# Patient Record
Sex: Female | Born: 1985
Health system: Southern US, Community
[De-identification: ages and names within clinical notes are randomized; demographics above are authoritative.]

## PROBLEM LIST (undated history)

## (undated) ENCOUNTER — Inpatient Hospital Stay (HOSPITAL_COMMUNITY): Payer: Self-pay

## (undated) DIAGNOSIS — G43909 Migraine, unspecified, not intractable, without status migrainosus: Secondary | ICD-10-CM

## (undated) DIAGNOSIS — I10 Essential (primary) hypertension: Secondary | ICD-10-CM

## (undated) DIAGNOSIS — O26899 Other specified pregnancy related conditions, unspecified trimester: Secondary | ICD-10-CM

## (undated) DIAGNOSIS — Z6791 Unspecified blood type, Rh negative: Secondary | ICD-10-CM

## (undated) DIAGNOSIS — R87619 Unspecified abnormal cytological findings in specimens from cervix uteri: Secondary | ICD-10-CM

## (undated) DIAGNOSIS — IMO0002 Reserved for concepts with insufficient information to code with codable children: Secondary | ICD-10-CM

## (undated) HISTORY — DX: Other specified pregnancy related conditions, unspecified trimester: O26.899

## (undated) HISTORY — DX: Essential (primary) hypertension: I10

## (undated) HISTORY — PX: DILATION AND CURETTAGE OF UTERUS: SHX78

## (undated) HISTORY — DX: Reserved for concepts with insufficient information to code with codable children: IMO0002

## (undated) HISTORY — PX: TONSILLECTOMY: SUR1361

## (undated) HISTORY — DX: Unspecified abnormal cytological findings in specimens from cervix uteri: R87.619

## (undated) HISTORY — DX: Unspecified blood type, rh negative: Z67.91

## (undated) HISTORY — DX: Migraine, unspecified, not intractable, without status migrainosus: G43.909

## (undated) HISTORY — PX: TUBAL LIGATION: SHX77

---
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
## 2010-01-10 ENCOUNTER — Ambulatory Visit: Payer: Self-pay | Admitting: Obstetrics and Gynecology

## 2010-01-20 ENCOUNTER — Ambulatory Visit: Payer: Self-pay | Admitting: Obstetrics & Gynecology

## 2010-02-17 ENCOUNTER — Ambulatory Visit: Payer: Self-pay | Admitting: Obstetrics and Gynecology

## 2010-03-01 ENCOUNTER — Ambulatory Visit (HOSPITAL_COMMUNITY)
Admission: RE | Admit: 2010-03-01 | Discharge: 2010-03-01 | Payer: Self-pay | Source: Home / Self Care | Attending: Family Medicine | Admitting: Family Medicine

## 2010-03-17 ENCOUNTER — Ambulatory Visit: Payer: Self-pay | Admitting: Obstetrics & Gynecology

## 2010-04-19 ENCOUNTER — Ambulatory Visit
Admission: RE | Admit: 2010-04-19 | Discharge: 2010-04-19 | Payer: Self-pay | Source: Home / Self Care | Attending: Obstetrics & Gynecology | Admitting: Obstetrics & Gynecology

## 2010-04-19 ENCOUNTER — Encounter: Payer: Self-pay | Admitting: Obstetrics & Gynecology

## 2010-04-20 ENCOUNTER — Inpatient Hospital Stay (HOSPITAL_COMMUNITY)
Admission: AD | Admit: 2010-04-20 | Discharge: 2010-04-20 | Disposition: A | Discharge status: Home / Self Care | Payer: BC Managed Care – PPO | Source: Ambulatory Visit | Attending: Obstetrics & Gynecology | Admitting: Obstetrics & Gynecology

## 2010-04-20 DIAGNOSIS — N390 Urinary tract infection, site not specified: Secondary | ICD-10-CM | POA: Insufficient documentation

## 2010-04-20 DIAGNOSIS — M549 Dorsalgia, unspecified: Secondary | ICD-10-CM

## 2010-04-20 DIAGNOSIS — O2390 Unspecified genitourinary tract infection in pregnancy, unspecified trimester: Secondary | ICD-10-CM | POA: Insufficient documentation

## 2010-04-20 LAB — URINE MICROSCOPIC-ADD ON

## 2010-04-20 LAB — URINALYSIS, ROUTINE W REFLEX MICROSCOPIC: Urobilinogen, UA: 1 mg/dL (ref 0.0–1.0)

## 2010-04-22 LAB — URINE CULTURE: Culture  Setup Time: 201202020412

## 2010-05-11 ENCOUNTER — Encounter: Payer: BC Managed Care – PPO | Admitting: Obstetrics & Gynecology

## 2010-05-11 ENCOUNTER — Encounter: Payer: Self-pay | Admitting: Family Medicine

## 2010-05-11 ENCOUNTER — Encounter: Payer: Self-pay | Admitting: Obstetrics & Gynecology

## 2010-05-11 DIAGNOSIS — Z3400 Encounter for supervision of normal first pregnancy, unspecified trimester: Secondary | ICD-10-CM

## 2010-05-11 DIAGNOSIS — O361190 Maternal care for Anti-A sensitization, unspecified trimester, not applicable or unspecified: Secondary | ICD-10-CM

## 2010-05-11 LAB — CONVERTED CEMR LAB
MCV: 93 fL (ref 78.0–100.0)
RDW: 14.1 % (ref 11.5–15.5)
WBC: 8.6 10*3/uL (ref 4.0–10.5)

## 2010-05-15 ENCOUNTER — Inpatient Hospital Stay (HOSPITAL_COMMUNITY)
Admission: AD | Admit: 2010-05-15 | Discharge: 2010-05-15 | Disposition: A | Discharge status: Home / Self Care | Payer: BC Managed Care – PPO | Source: Ambulatory Visit | Attending: Obstetrics and Gynecology | Admitting: Obstetrics and Gynecology

## 2010-05-15 ENCOUNTER — Inpatient Hospital Stay (HOSPITAL_COMMUNITY): Payer: BC Managed Care – PPO

## 2010-05-15 DIAGNOSIS — N390 Urinary tract infection, site not specified: Secondary | ICD-10-CM

## 2010-05-15 DIAGNOSIS — O2390 Unspecified genitourinary tract infection in pregnancy, unspecified trimester: Secondary | ICD-10-CM

## 2010-05-15 DIAGNOSIS — R109 Unspecified abdominal pain: Secondary | ICD-10-CM | POA: Insufficient documentation

## 2010-05-15 DIAGNOSIS — M549 Dorsalgia, unspecified: Secondary | ICD-10-CM

## 2010-05-15 DIAGNOSIS — O99891 Other specified diseases and conditions complicating pregnancy: Secondary | ICD-10-CM | POA: Insufficient documentation

## 2010-05-15 LAB — URINALYSIS, ROUTINE W REFLEX MICROSCOPIC
Ketones, ur: NEGATIVE mg/dL
Protein, ur: NEGATIVE mg/dL
Urine Glucose, Fasting: NEGATIVE mg/dL
Urobilinogen, UA: 0.2 mg/dL (ref 0.0–1.0)

## 2010-05-15 LAB — URINE MICROSCOPIC-ADD ON

## 2010-05-25 ENCOUNTER — Encounter: Payer: BC Managed Care – PPO | Admitting: Obstetrics & Gynecology

## 2010-05-25 DIAGNOSIS — Z3400 Encounter for supervision of normal first pregnancy, unspecified trimester: Secondary | ICD-10-CM

## 2010-06-08 ENCOUNTER — Encounter: Payer: BC Managed Care – PPO | Admitting: Obstetrics & Gynecology

## 2010-06-08 DIAGNOSIS — Z3480 Encounter for supervision of other normal pregnancy, unspecified trimester: Secondary | ICD-10-CM

## 2010-06-29 ENCOUNTER — Encounter: Payer: BC Managed Care – PPO | Admitting: Obstetrics & Gynecology

## 2010-06-29 DIAGNOSIS — Z3480 Encounter for supervision of other normal pregnancy, unspecified trimester: Secondary | ICD-10-CM

## 2010-07-02 ENCOUNTER — Inpatient Hospital Stay (HOSPITAL_COMMUNITY)
Admission: AD | Admit: 2010-07-02 | Discharge: 2010-07-02 | Disposition: A | Discharge status: Home / Self Care | Payer: BC Managed Care – PPO | Source: Ambulatory Visit | Attending: Obstetrics and Gynecology | Admitting: Obstetrics and Gynecology

## 2010-07-02 DIAGNOSIS — O99891 Other specified diseases and conditions complicating pregnancy: Secondary | ICD-10-CM | POA: Insufficient documentation

## 2010-07-02 DIAGNOSIS — R51 Headache: Secondary | ICD-10-CM | POA: Insufficient documentation

## 2010-07-05 ENCOUNTER — Encounter: Payer: BC Managed Care – PPO | Admitting: Obstetrics and Gynecology

## 2010-07-05 DIAGNOSIS — Z3480 Encounter for supervision of other normal pregnancy, unspecified trimester: Secondary | ICD-10-CM

## 2010-07-14 ENCOUNTER — Encounter (INDEPENDENT_AMBULATORY_CARE_PROVIDER_SITE_OTHER): Payer: BC Managed Care – PPO | Admitting: Obstetrics & Gynecology

## 2010-07-14 DIAGNOSIS — Z3480 Encounter for supervision of other normal pregnancy, unspecified trimester: Secondary | ICD-10-CM

## 2010-07-20 ENCOUNTER — Encounter (INDEPENDENT_AMBULATORY_CARE_PROVIDER_SITE_OTHER): Payer: BC Managed Care – PPO | Admitting: Obstetrics & Gynecology

## 2010-07-20 DIAGNOSIS — Z3400 Encounter for supervision of normal first pregnancy, unspecified trimester: Secondary | ICD-10-CM

## 2010-07-27 ENCOUNTER — Encounter (INDEPENDENT_AMBULATORY_CARE_PROVIDER_SITE_OTHER): Payer: BC Managed Care – PPO | Admitting: Family Medicine

## 2010-07-27 ENCOUNTER — Inpatient Hospital Stay (HOSPITAL_COMMUNITY)
Admission: AD | Admit: 2010-07-27 | Discharge: 2010-07-27 | Disposition: A | Discharge status: Home / Self Care | Payer: BC Managed Care – PPO | Source: Ambulatory Visit | Attending: Family Medicine | Admitting: Family Medicine

## 2010-07-27 DIAGNOSIS — O479 False labor, unspecified: Secondary | ICD-10-CM | POA: Insufficient documentation

## 2010-07-27 DIAGNOSIS — Z3400 Encounter for supervision of normal first pregnancy, unspecified trimester: Secondary | ICD-10-CM

## 2010-07-28 ENCOUNTER — Inpatient Hospital Stay (HOSPITAL_COMMUNITY)
Admission: AD | Admit: 2010-07-28 | Discharge: 2010-07-30 | DRG: 372 | Disposition: A | Discharge status: Home / Self Care | Payer: BC Managed Care – PPO | Source: Ambulatory Visit | Attending: Obstetrics and Gynecology | Admitting: Obstetrics and Gynecology

## 2010-07-28 LAB — CBC
MCH: 30.9 pg (ref 26.0–34.0)
MCHC: 34.3 g/dL (ref 30.0–36.0)
Platelets: 203 10*3/uL (ref 150–400)
RBC: 3.91 MIL/uL (ref 3.87–5.11)
WBC: 15 10*3/uL — ABNORMAL HIGH (ref 4.0–10.5)

## 2010-07-29 LAB — CBC
HCT: 25.9 % — ABNORMAL LOW (ref 36.0–46.0)
MCV: 91.2 fL (ref 78.0–100.0)
Platelets: 171 10*3/uL (ref 150–400)
RBC: 2.84 MIL/uL — ABNORMAL LOW (ref 3.87–5.11)
RDW: 14.2 % (ref 11.5–15.5)
WBC: 16.5 10*3/uL — ABNORMAL HIGH (ref 4.0–10.5)

## 2010-07-29 LAB — RPR: RPR Ser Ql: NONREACTIVE

## 2010-08-02 ENCOUNTER — Encounter: Payer: BC Managed Care – PPO | Admitting: Obstetrics & Gynecology

## 2010-08-08 NOTE — Discharge Summary (Signed)
NAMECECILY, [name]              ACCOUNT NO.:  [id number]  MEDICAL RECORD NO.:  [id number]           PATIENT TYPE:  I  LOCATION:  9142                          [facility]ILITY:  [facility]  PHYSICIAN:  Catalina Antigua, MD     DATE OF BIRTH:  04-13-1985  DATE OF ADMISSION:  07/28/2010 DATE OF DISCHARGE:  07/30/2010                              DISCHARGE SUMMARY   DISCHARGE DIAGNOSES: 1. Spontaneous vaginal delivery. 2. Retained placental products with borderline temperature.  LABS AND STUDIES: 1. CBC obtained on Jul 28, 2010, demonstrated white count of 15,     hemoglobin 12.1, platelets of 203. 2. RPR obtained on Jul 28, 2010, is nonreactive. 3. CBC obtained on Jul 29, 2010, demonstrated white count of 16.5,     hemoglobin 8.8, platelets 174.  BRIEF HOSPITAL COURSE:  This is a 25 year old female who was admitted for spontaneous onset of labor at 68 weeks' gestation.  On Jul 28, 2010, at 1727, she delivered a viable female infant with Apgars of 8 and 9. Weight was 6 pounds 14 ounces.  The patient was found to have retained placental tissue following delivery with some continued bleeding.  The tissue was manually evacuated and the bleeding subsided.  At that time, the patient was also noted to have a borderline temperature of 99.8 degrees Fahrenheit.  Accordingly, the patient was placed on gentamicin plus clindamycin for 24 hours.  The patient did well following delivery, pain was controlled with oral medications, and the patient's diet rapidly advanced.  On postpartum day #2, the patient was felt stable for discharge.  At the time of discharge, the patient's vital signs were stable, she had remained afebrile throughout the remainder of her hospital course, and she was only experiencing moderate amounts of lochia.  DISCHARGE INSTRUCTIONS:  The patient was discharged home with a regular diet, no activity restrictions, pelvic rest for 6 weeks, and no specific wound care  instructions.  DISCHARGE MEDICATIONS: 1. Zofran 4 mg by mouth every 6 hours as needed for nausea (the     patient did not require this medication while in hospital, but did     require it prior to hospitalization). 2. Tylenol PM daily at bedtime as needed for sleep. 3. Tylenol 500 mg by mouth every 6 hours as needed for pain, not to be     taken at the same time as Tylenol PM. 4. Prenatal vitamin, take for 6 weeks while breastfeeding. 5. Colace 100 mg by mouth twice daily as needed for constipation. 6. Motrin 600 mg by mouth every 6 hours as needed for pain.  FOLLOWUP APPOINTMENTS:  The patient is to return to [facility] in 6 weeks for her postpartum check.  At this point in time, the patient is interested in having a Mirena placed.  DISCHARGE CONDITION:  The patient was discharged home in stable medical condition.    ______________________________ Linda Homer, MD   ______________________________ Catalina Antigua, MD    ER/MEDQ  D:  07/30/2010  T:  07/30/2010  Job:  [id number]  Electronically Signed by Manuela Neptune MD on 08/03/2010 07:22:56 PM Electronically Signed by Catalina Antigua  on 08/08/2010 06:54:20 PM

## 2010-08-21 ENCOUNTER — Inpatient Hospital Stay (HOSPITAL_COMMUNITY): Payer: BC Managed Care – PPO

## 2010-08-21 ENCOUNTER — Other Ambulatory Visit: Payer: Self-pay | Admitting: Obstetrics and Gynecology

## 2010-08-21 ENCOUNTER — Ambulatory Visit (HOSPITAL_COMMUNITY)
Admission: AD | Admit: 2010-08-21 | Discharge: 2010-08-21 | Disposition: A | Discharge status: Home / Self Care | Payer: BC Managed Care – PPO | Source: Ambulatory Visit | Attending: Obstetrics and Gynecology | Admitting: Obstetrics and Gynecology

## 2010-08-21 LAB — URINALYSIS, ROUTINE W REFLEX MICROSCOPIC
Bilirubin Urine: NEGATIVE
Bilirubin Urine: NEGATIVE
Glucose, UA: NEGATIVE mg/dL
Hgb urine dipstick: NEGATIVE
Ketones, ur: NEGATIVE mg/dL
Ketones, ur: NEGATIVE mg/dL
Leukocytes, UA: NEGATIVE
Nitrite: NEGATIVE
Urobilinogen, UA: 2 mg/dL — ABNORMAL HIGH (ref 0.0–1.0)
Urobilinogen, UA: 4 mg/dL — ABNORMAL HIGH (ref 0.0–1.0)
pH: 8.5 — ABNORMAL HIGH (ref 5.0–8.0)

## 2010-08-21 LAB — CBC
HCT: 31.9 % — ABNORMAL LOW (ref 36.0–46.0)
Hemoglobin: 10.6 g/dL — ABNORMAL LOW (ref 12.0–15.0)
Platelets: 220 10*3/uL (ref 150–400)
RDW: 12.8 % (ref 11.5–15.5)

## 2010-08-21 LAB — URINE MICROSCOPIC-ADD ON

## 2010-08-23 LAB — URINE CULTURE: Culture  Setup Time: 201206040014

## 2010-09-13 ENCOUNTER — Ambulatory Visit (INDEPENDENT_AMBULATORY_CARE_PROVIDER_SITE_OTHER): Payer: BC Managed Care – PPO | Admitting: Family Medicine

## 2010-09-14 NOTE — Op Note (Signed)
  [name], [name]              ACCOUNT NO.:  [id number]  MEDICAL RECORD NO.:  [id number]           PATIENT TYPE:  O  LOCATION:  [facility]                          [facility]  PHYSICIAN:  Catalina Antigua, MD     DATE OF BIRTH:  1985/06/07  DATE OF PROCEDURE:  08/21/2010 DATE OF DISCHARGE:                              OPERATIVE REPORT   PREOPERATIVE DIAGNOSIS:  A 25 year old G1, P1 who is 4 weeks postpartum with retained products of conception.  POSTOPERATIVE DIAGNOSIS:  A 25 year old G1, P1 who is 4 weeks postpartum with retained products of conception.  PROCEDURE:  Dilatation and curettage.  SURGEON:  Catalina Antigua, MD  ASSISTANT:  None.  ANESTHESIA:  MAC.  ESTIMATED BLOOD LOSS:  100 mL.  COMPLICATIONS:  None.  FINDINGS:  Approximately 12-week size uterus.  No palpable adnexal mass. The uterine cavity sounded to 11 cm.  Specimen collected were products of conception and they were sent to Pathology.  PROCEDURE:  After informed consent was obtained, the patient was taken to the operating room where anesthesia was induced and found to be adequate.  The patient was placed in dorsal lithotomy position and prepped and draped in the usual sterile fashion.  Exam under anesthesia was performed with the above-noted findings.  The speculum was placed in the vagina.  Anterior lip of the cervix was grasped with single-tooth tenaculum.  The cervix was injected in the circumference with 1% solution of Nesacaine.  The uterine cavity was sounded to 11 cm.  The cervix was gently dilated to 9 cm through serial insertion of Hegar dilators.  The 9 mm suction curette was introduced into the uterus and suction was applied.  A size one Sims curette was then used to perform gentle curettage.  The 9 mm suction curette was then reintroduced into uterine cavity as to aspirate any loosened debris.  A moderate amount of bleeding was then observed.  Methergine and Pitocin were  administered with good response.  All instruments were removed from the patient's vagina.  Hemostasis was visualized at the tenaculum site.  The patient tolerated the procedure well.  Sponge, lap, and needle count were correct x2.     Catalina Antigua, MD     PC/MEDQ  D:  08/21/2010  T:  08/22/2010  Job:  [id number]  Electronically Signed by Catalina Antigua  on 09/14/2010 09:07:40 AM

## 2010-10-12 ENCOUNTER — Ambulatory Visit (INDEPENDENT_AMBULATORY_CARE_PROVIDER_SITE_OTHER): Payer: BC Managed Care – PPO | Admitting: Obstetrics & Gynecology

## 2010-10-12 ENCOUNTER — Encounter: Payer: Self-pay | Admitting: Obstetrics & Gynecology

## 2010-10-12 VITALS — BP 110/65 | HR 79 | Wt 163.0 lb

## 2010-10-12 DIAGNOSIS — N912 Amenorrhea, unspecified: Secondary | ICD-10-CM

## 2010-10-12 NOTE — Progress Notes (Signed)
  Subjective:    Patient ID: [name], female    DOB: 04-11-1985, 25 y.o.   MRN: [id number]  HPI  Mrs. Charbonnet is here 11 weeks post partum and 6 weeks post op status d&c for retained products.  Her only complaint is that of "tenderness" of her perineum at the onset of intercourse.  This discomfort resolves as sex progresses.  She breast fed for 1 week but then quit.  Her breasts have dried up now.  She wants another pregnancy ASAP and has been having unprotected intercourse since 4 weeks post partum.  Review of Systems     Objective:   Physical Exam    EG: normal, well-healed Uterus: NSSR, NT, mobile Adnexa: NT, no masses    Assessment & Plan:  Post partum: I have recommended that she wait til her son is about 56 yo before she begins to try for another, but she should start on MVIs now in order to prevent NTDs.

## 2010-10-12 NOTE — Progress Notes (Signed)
Addended by: Barbara Cower on: 10/12/2010 11:55 AM   Modules accepted: Orders

## 2010-11-15 ENCOUNTER — Other Ambulatory Visit (HOSPITAL_COMMUNITY)
Admission: RE | Admit: 2010-11-15 | Discharge: 2010-11-15 | Disposition: A | Discharge status: Home / Self Care | Payer: Medicaid Other | Source: Ambulatory Visit | Attending: Family Medicine | Admitting: Family Medicine

## 2010-11-15 ENCOUNTER — Ambulatory Visit (INDEPENDENT_AMBULATORY_CARE_PROVIDER_SITE_OTHER): Payer: Medicaid Other | Admitting: Family Medicine

## 2010-11-15 ENCOUNTER — Encounter: Payer: Self-pay | Admitting: Family Medicine

## 2010-11-15 VITALS — BP 116/75 | HR 80 | Ht 67.0 in | Wt 167.0 lb

## 2010-11-15 DIAGNOSIS — Z1272 Encounter for screening for malignant neoplasm of vagina: Secondary | ICD-10-CM

## 2010-11-15 DIAGNOSIS — Z01419 Encounter for gynecological examination (general) (routine) without abnormal findings: Secondary | ICD-10-CM | POA: Insufficient documentation

## 2010-11-15 NOTE — Progress Notes (Signed)
Here for pap

## 2010-11-15 NOTE — Progress Notes (Signed)
  Subjective:     [name] is a 25 y.o. female and is here for a comprehensive physical exam. The patient reports no problems.  History   Social History  . Marital Status: Married    Spouse Name: N/A    Number of Children: N/A  . Years of Education: N/A   Occupational History  . Not on file.   Social History Main Topics  . Smoking status: Never Smoker   . Smokeless tobacco: Never Used  . Alcohol Use: No  . Drug Use: No  . Sexually Active: Yes -- Female partner(s)    Birth Control/ Protection: None   Other Topics Concern  . Not on file   Social History Narrative  . No narrative on file   Health Maintenance  Topic Date Due  . Pap Smear  11/03/2004  . Tetanus/tdap  11/03/2005    The following portions of the patient's history were reviewed and updated as appropriate: allergies, current medications, past family history, past medical history, past social history, past surgical history and problem list.  Review of Systems A comprehensive review of systems was negative.   Objective:    BP 116/75  Pulse 80  Ht 5\' 7"  (1.702 m)  Wt 167 lb (75.751 kg)  BMI 26.16 kg/m2  LMP 11/14/2010  Breastfeeding? Unknown General appearance: alert, cooperative and appears stated age Head: Normocephalic, without obvious abnormality, atraumatic Neck: no adenopathy, supple, symmetrical, trachea midline and thyroid not enlarged, symmetric, no tenderness/mass/nodules Lungs: clear to auscultation bilaterally Breasts: normal appearance, no masses or tenderness Heart: regular rate and rhythm, S1, S2 normal, no murmur, click, rub or gallop Abdomen: soft, non-tender; bowel sounds normal; no masses,  no organomegaly Pelvic: cervix normal in appearance, external genitalia normal, no adnexal masses or tenderness, no cervical motion tenderness, uterus normal size, shape, and consistency and vagina normal without discharge Extremities: extremities normal, atraumatic, no cyanosis or edema Pulses:  2+ and symmetric Skin: Skin color, texture, turgor normal. No rashes or lesions Lymph nodes: Cervical, supraclavicular, and axillary nodes normal. Neurologic: Alert and oriented X 3, normal strength and tone. Normal symmetric reflexes. Normal coordination and gait    Assessment:    Healthy female exam.      Plan:  Pap smear today   See After Visit Summary for Counseling Recommendations

## 2010-11-15 NOTE — Patient Instructions (Signed)

## 2011-02-06 ENCOUNTER — Ambulatory Visit (INDEPENDENT_AMBULATORY_CARE_PROVIDER_SITE_OTHER): Payer: Medicaid Other | Admitting: Family Medicine

## 2011-02-06 VITALS — BP 141/80 | Wt 171.0 lb

## 2011-02-06 DIAGNOSIS — Z3480 Encounter for supervision of other normal pregnancy, unspecified trimester: Secondary | ICD-10-CM

## 2011-02-06 LAB — POCT URINE PREGNANCY: Preg Test, Ur: POSITIVE

## 2011-02-06 MED ORDER — CONCEPT OB 130-92.4-1 MG PO CAPS: 1.0000 | ORAL_CAPSULE | Freq: Every day | ORAL | Status: DC

## 2011-02-06 NOTE — Progress Notes (Signed)
Patient is here for Prenatal work up today.  She declines first trimester screening.  Her periods have returned since 3 months.  Her last cycle was October 20.  She is doing well.  Her son is 54 months old now.  She was actively trying to conceive.  Today she has a positive pregnancy test and we will do her initial bloodwork screening.  We will schedule appointment with the Dr. For New OB.  She will return in two weeks to see the Dr. And we will do an ultrasound here at that time to confirm placement.  She is too early today to see anything on the ultrasound.

## 2011-02-07 LAB — OBSTETRIC PANEL
Basophils Relative: 0 % (ref 0–1)
Eosinophils Absolute: 0.1 10*3/uL (ref 0.0–0.7)
Hepatitis B Surface Ag: NEGATIVE
Lymphs Abs: 2.2 10*3/uL (ref 0.7–4.0)
MCH: 28.9 pg (ref 26.0–34.0)
MCHC: 32.1 g/dL (ref 30.0–36.0)
Neutro Abs: 3.8 10*3/uL (ref 1.7–7.7)
Neutrophils Relative %: 58 % (ref 43–77)
Platelets: 281 10*3/uL (ref 150–400)
RBC: 4.43 MIL/uL (ref 3.87–5.11)

## 2011-02-10 LAB — CULTURE, URINE COMPREHENSIVE

## 2011-03-02 ENCOUNTER — Ambulatory Visit (INDEPENDENT_AMBULATORY_CARE_PROVIDER_SITE_OTHER): Payer: Medicaid Other | Admitting: Family Medicine

## 2011-03-02 ENCOUNTER — Encounter: Payer: Self-pay | Admitting: Family Medicine

## 2011-03-02 DIAGNOSIS — Z6791 Unspecified blood type, Rh negative: Secondary | ICD-10-CM | POA: Insufficient documentation

## 2011-03-02 DIAGNOSIS — O26899 Other specified pregnancy related conditions, unspecified trimester: Secondary | ICD-10-CM

## 2011-03-02 DIAGNOSIS — Z3480 Encounter for supervision of other normal pregnancy, unspecified trimester: Secondary | ICD-10-CM | POA: Insufficient documentation

## 2011-03-02 DIAGNOSIS — O360990 Maternal care for other rhesus isoimmunization, unspecified trimester, not applicable or unspecified: Secondary | ICD-10-CM

## 2011-03-02 NOTE — Patient Instructions (Addendum)
RhoGAM When you are pregnant, you will have a blood test to find out what blood type you are. When a pregnant women is Rh-negative, it means that she does not have the Rh factor or antigen (a specific protein in red blood cells). The father of the baby will also be tested for his blood type. If he is Rh-negative also, the baby will be Rh-negative. In this case, no Rh problems will develop during the pregnancy, and RhoGAM will not be needed. If the father is Rh-positive, it is possible that the baby will be Rh-positive, which is incompatible with the mother's Rh-negative blood. In this case, the mother's blood will react as though she is allergic to the baby's blood. Her body will produce antibodies to destroy the baby's red blood cells. This causes anemia, brain damage, and even death of the baby. RhoGAM (anti-Rh, anti-D immunoglobulin) is a vaccine or immunization produced from human plasma. It is given to Rh-negative women who have Rh-positive babies, to protect the babies of future pregnancies. Firstborn infants are usually not affected, because it takes time for the mother's body to develop the antibodies. About 15% of people are Rh-negative. CAUSES  In a normal pregnancy, small numbers of the baby's red blood cells get into the mother's bloodstream. When the baby is Rh-positive, the mother's immune system (body system that fights infections and illness) recognizes that the blood is different from her own. The mother's body tries to fight it off and destroy it, like fighting off an infection or illness. The mother's immune system forms antibodies against the baby's blood. These antibodies are passed through the placenta to the baby, and they begin to destroy the baby's red blood cells. The baby becomes anemic (lacking enough red blood cells). The Rh problem does not usually affect the first baby. If the mother does not get RhoGAM, each of the following pregnancies gets more dangerous, if the future babies  are Rh-positive. That is because the mother's immune system develops more antibodies each time. It gets stronger and increases the number of antibodies against the baby's red blood cells with each future pregnancy. TREATMENT  RhoGAM is a solution containing small amounts of Rh antibodies. It prevents the development of antibodies against Rh-positive blood. It is injected into the mother at around 7 months of pregnancy, if she is Rh-negative. It is injected again within 72 hours after the baby is born, if the baby is Rh-positive. It is also given anytime during the pregnancy, if uterine bleeding develops. RhoGAM does not hurt the baby. It has few and minor side effects, if any, to the mother. A MICRhoGAM (smaller dose) is given in case of a miscarriage, elective abortion, or tubal pregnancy (pregnancy outside the uterus) if it is before 13 weeks of the pregnancy. RhoGAM should be given in the event of:  Miscarriage.   Threatened miscarriage, if there is bleeding.   Induced abortion.   Tubal (ectopic) pregnancy.   Any bleeding during the pregnancy.   Taking an amniotic fluid sample (amniocentesis).   Blood sampling.   Getting the wrong blood transfusion (positive blood in an Rh-negative woman).   Moving the baby from breech to normal position (external version).   Taking a placenta tissue sample (chorionic villus sampling).   Taking a blood sample from the umbilical cord (cordocentesis).   Mass of cysts in the uterus, instead of a fetus (hydatidiform mole).   Failure to get RhoGAM when necessary.   Pregnancy lasting past the due date, when  the last RhoGAM shot was given 12 weeks ago.   Injury (trauma) to the abdomen.  RhoGAM should be given even if the Rh-negative mother has a tubal ligation (female sterilization, "tied tubes"). This is because some women will later decide to have surgery to re-open the tubes, in order to get pregnant again. Or the tubal ligation might not  work. RhoGAM is given after the delivery of an Rh-positive baby to an Rh-negative mother. It only protects the baby in the next pregnancy. RhoGAM must be given after each delivery of an Rh-positive baby born to an Rh-negative mother. RhoGAM cannot help a pregnant Rh-negative mother if she has already been sensitized with Rh-positive antibodies. RhoGAM is not known to transmit hepatitis or other infectious diseases. Your caregiver can discuss the recommendations and uses of RhoGAM with you. You should not receive RhoGAM if you had an allergic reaction to it in the past. Document Released: 08/26/2001 Document Revised: 11/16/2010 Document Reviewed: 03/16/2009 [facility] Patient Information 2012 [place], [place]. Pregnancy - First Trimester During sexual intercourse, millions of sperm go into the vagina. Only 1 sperm will penetrate and fertilize the female egg while it is in the Fallopian tube. One week later, the fertilized egg implants into the wall of the uterus. An embryo begins to develop into a baby. At 6 to 8 weeks, the eyes and face are formed and the heartbeat can be seen on ultrasound. At the end of 12 weeks (first trimester), all the baby's organs are formed. Now that you are pregnant, you will want to do everything you can to have a healthy baby. Two of the most important things are to get good prenatal care and follow your caregiver's instructions. Prenatal care is all the medical care you receive before the baby's birth. It is given to prevent, find, and treat problems during the pregnancy and childbirth. PRENATAL EXAMS  During prenatal visits, your weight, blood pressure and urine are checked. This is done to make sure you are healthy and progressing normally during the pregnancy.   A pregnant woman should gain 25 to 35 pounds during the pregnancy. However, if you are over weight or underweight, your caregiver will advise you regarding your weight.   Your caregiver will ask and answer questions  for you.   Blood work, cervical cultures, other necessary tests and a Pap test are done during your prenatal exams. These tests are done to check on your health and the probable health of your baby. Tests are strongly recommended and done for HIV with your permission. This is the virus that causes AIDS. These tests are done because medications can be given to help prevent your baby from being born with this infection should you have been infected without knowing it. Blood work is also used to find out your blood type, previous infections and follow your blood levels (hemoglobin).   Low hemoglobin (anemia) is common during pregnancy. Iron and vitamins are given to help prevent this. Later in the pregnancy, blood tests for diabetes will be done along with any other tests if any problems develop. You may need tests to make sure you and the baby are doing well.   You may need other tests to make sure you and the baby are doing well.  CHANGES DURING THE FIRST TRIMESTER (THE FIRST 3 MONTHS OF PREGNANCY) Your body goes through many changes during pregnancy. They vary from person to person. Talk to your caregiver about changes you notice and are concerned about. Changes can include:  Your menstrual period stops.   The egg and sperm carry the genes that determine what you look like. Genes from you and your partner are forming a baby. The female genes determine whether the baby is a boy or a girl.   Your body increases in girth and you may feel bloated.   Feeling sick to your stomach (nauseous) and throwing up (vomiting). If the vomiting is uncontrollable, call your caregiver.   Your breasts will begin to enlarge and become tender.   Your nipples may stick out more and become darker.   The need to urinate more. Painful urination may mean you have a bladder infection.   Tiring easily.   Loss of appetite.   Cravings for certain kinds of food.   At first, you may gain or lose a couple of pounds.    You may have changes in your emotions from day to day (excited to be pregnant or concerned something may go wrong with the pregnancy and baby).   You may have more vivid and strange dreams.  HOME CARE INSTRUCTIONS   It is very important to avoid all smoking, alcohol and un-prescribed drugs during your pregnancy. These affect the formation and growth of the baby. Avoid chemicals while pregnant to ensure the delivery of a healthy infant.   Start your prenatal visits by the 12th week of pregnancy. They are usually scheduled monthly at first, then more often in the last 2 months before delivery. Keep your caregiver's appointments. Follow your caregiver's instructions regarding medication use, blood and lab tests, exercise, and diet.   During pregnancy, you are providing food for you and your baby. Eat regular, well-balanced meals. Choose foods such as meat, fish, milk and other low fat dairy products, vegetables, fruits, and whole-grain breads and cereals. Your caregiver will tell you of the ideal weight gain.   You can help morning sickness by keeping soda crackers at the bedside. Eat a couple before arising in the morning. You may want to use the crackers without salt on them.   Eating 4 to 5 small meals rather than 3 large meals a day also may help the nausea and vomiting.   Drinking liquids between meals instead of during meals also seems to help nausea and vomiting.   A physical sexual relationship may be continued throughout pregnancy if there are no other problems. Problems may be early (premature) leaking of amniotic fluid from the membranes, vaginal bleeding, or belly (abdominal) pain.   Exercise regularly if there are no restrictions. Check with your caregiver or physical therapist if you are unsure of the safety of some of your exercises. Greater weight gain will occur in the last 2 trimesters of pregnancy. Exercising will help:   Control your weight.   Keep you in shape.    Prepare you for labor and delivery.   Help you lose your pregnancy weight after you deliver your baby.   Wear a good support or jogging bra for breast tenderness during pregnancy. This may help if worn during sleep too.   Ask when prenatal classes are available. Begin classes when they are offered.   Do not use hot tubs, steam rooms or saunas.   Wear your seat belt when driving. This protects you and your baby if you are in an accident.   Avoid raw meat, uncooked cheese, cat litter boxes and soil used by cats throughout the pregnancy. These carry germs that can cause birth defects in the baby.   The first  trimester is a good time to visit your dentist for your dental health. Getting your teeth cleaned is OK. Use a softer toothbrush and brush gently during pregnancy.   Ask for help if you have financial, counseling or nutritional needs during pregnancy. Your caregiver will be able to offer counseling for these needs as well as refer you for other special needs.   Do not take any medications or herbs unless told by your caregiver.   Inform your caregiver if there is any mental or physical domestic violence.   Make a list of emergency phone numbers of family, friends, hospital, and police and fire departments.   Write down your questions. Take them to your prenatal visit.   Do not douche.   Do not cross your legs.   If you have to stand for long periods of time, rotate you feet or take small steps in a circle.   You may have more vaginal secretions that may require a sanitary pad. Do not use tampons or scented sanitary pads.  MEDICATIONS AND DRUG USE IN PREGNANCY  Take prenatal vitamins as directed. The vitamin should contain 1 milligram of folic acid. Keep all vitamins out of reach of children. Only a couple vitamins or tablets containing iron may be fatal to a baby or young child when ingested.   Avoid use of all medications, including herbs, over-the-counter medications, not  prescribed or suggested by your caregiver. Only take over-the-counter or prescription medicines for pain, discomfort, or fever as directed by your caregiver. Do not use aspirin, ibuprofen, or naproxen unless directed by your caregiver.   Let your caregiver also know about herbs you may be using.   Alcohol is related to a number of birth defects. This includes fetal alcohol syndrome. All alcohol, in any form, should be avoided completely. Smoking will cause low birth rate and premature babies.   Street or illegal drugs are very harmful to the baby. They are absolutely forbidden. A baby born to an addicted mother will be addicted at birth. The baby will go through the same withdrawal an adult does.   Let your caregiver know about any medications that you have to take and for what reason you take them.  MISCARRIAGE IS COMMON DURING PREGNANCY A miscarriage does not mean you did something wrong. It is not a reason to worry about getting pregnant again. Your caregiver will help you with questions you may have. If you have a miscarriage, you may need minor surgery. SEEK MEDICAL CARE IF:  You have any concerns or worries during your pregnancy. It is better to call with your questions if you feel they cannot wait, rather than worry about them. SEEK IMMEDIATE MEDICAL CARE IF:   An unexplained oral temperature above 102 F (38.9 C) develops, or as your caregiver suggests.   You have leaking of fluid from the vagina (birth canal). If leaking membranes are suspected, take your temperature and inform your caregiver of this when you call.   There is vaginal spotting or bleeding. Notify your caregiver of the amount and how many pads are used.   You develop a bad smelling vaginal discharge with a change in the color.   You continue to feel sick to your stomach (nauseated) and have no relief from remedies suggested. You vomit blood or coffee ground-like materials.   You lose more than 2 pounds of weight in 1  week.   You gain more than 2 pounds of weight in 1 week and you notice  swelling of your face, hands, feet, or legs.   You gain 5 pounds or more in 1 week (even if you do not have swelling of your hands, face, legs, or feet).   You get exposed to [place] measles and have never had them.   You are exposed to fifth disease or chickenpox.   You develop belly (abdominal) pain. Round ligament discomfort is a common non-cancerous (benign) cause of abdominal pain in pregnancy. Your caregiver still must evaluate this.   You develop headache, fever, diarrhea, pain with urination, or shortness of breath.   You fall or are in a car accident or have any kind of trauma.   There is mental or physical violence in your home.  Document Released: 02/28/2001 Document Revised: 11/16/2010 Document Reviewed: 09/01/2008 [facility] Patient Information 2012 [place], [place].

## 2011-03-02 NOTE — Progress Notes (Signed)
   Subjective:    [name] is a G2P1001 [redacted]w[redacted]d being seen today for her first obstetrical visit.  Her obstetrical history is significant for Rh negative status, short inter-pregnancy interval. Patient does intend to breast feed. Pregnancy history fully reviewed.  Patient reports no complaints.  Filed Vitals:   03/02/11 0900  BP: 129/76  Weight: 165 lb (74.844 kg)    HISTORY: OB History    Grav Para Term Preterm Abortions TAB SAB Ect Mult Living   2 1 1  0 0 0 0 0 0 1     # Outc Date GA Lbr Len/2nd Wgt Sex Del Anes PTL Lv   1 TRM 5/12 [redacted]w[redacted]d  6lb14oz(3.118kg) M SVD EPI No Yes   2 CUR              Past Medical History  Diagnosis Date  . Rh negative state in antepartum period   . Abnormal Pap smear    Past Surgical History  Procedure Date  . Tonsillectomy     25 yrs old   Family History  Problem Relation Age of Onset  . Diabetes Maternal Grandmother   . Diabetes Maternal Grandfather      Exam    Uterine Size: size equals dates  Pelvic Exam:    Perineum: No Hemorrhoids   Vulva: normal   Vagina:  normal mucosa, normal discharge       Cervix: multiparous appearance   Adnexa: normal adnexa   Bony Pelvis: average  System:     Skin: normal coloration and turgor, no rashes    Neurologic: oriented, normal   Extremities: normal strength, tone, and muscle mass   HEENT PERRLA   Mouth/Teeth mucous membranes moist, pharynx normal without lesions   Neck supple   Cardiovascular: regular rate and rhythm   Respiratory:  appears well, vitals normal, no respiratory distress, acyanotic, normal RR, ear and throat exam is normal, neck free of mass or lymphadenopathy, chest clear, no wheezing, crepitations, rhonchi, normal symmetric air entry   Abdomen: soft, non-tender; bowel sounds normal; no masses,  no organomegaly   Urinary: urethral meatus normal   U/S shows 7 w 4 d CRL, SIUP + FHR.   Assessment:    Pregnancy: G2P1001 There is no problem list on file for this  patient.       Plan:     Initial labs drawn. Prenatal vitamins. Problem list reviewed and updated. Genetic Screening discussed First Screen: declined.  Ultrasound discussed; fetal survey: discussed.  Follow up in 4 weeks.   Jaicion Laurie S 03/02/2011

## 2011-03-06 ENCOUNTER — Other Ambulatory Visit: Payer: Self-pay | Admitting: Obstetrics & Gynecology

## 2011-03-22 ENCOUNTER — Other Ambulatory Visit (INDEPENDENT_AMBULATORY_CARE_PROVIDER_SITE_OTHER): Payer: Medicaid Other

## 2011-03-22 ENCOUNTER — Telehealth: Payer: Self-pay | Admitting: *Deleted

## 2011-03-22 DIAGNOSIS — O361190 Maternal care for Anti-A sensitization, unspecified trimester, not applicable or unspecified: Secondary | ICD-10-CM

## 2011-03-22 DIAGNOSIS — Z3480 Encounter for supervision of other normal pregnancy, unspecified trimester: Secondary | ICD-10-CM

## 2011-03-22 MED ORDER — PRENATAL RX 60-1 MG PO TABS: 1.0000 | ORAL_TABLET | Freq: Every day | ORAL | Status: DC

## 2011-03-22 MED ORDER — RHO D IMMUNE GLOBULIN 1500 UNIT/2ML IJ SOLN: 300.0000 ug | Freq: Once | INTRAMUSCULAR | Status: DC

## 2011-03-22 NOTE — Telephone Encounter (Signed)
Patient wishes to change to a generic prenatal due to increase in constipation.

## 2011-03-22 NOTE — Progress Notes (Signed)
Patient had a scant amount of bleeding following intercourse last night and is here for a Rhogam injection per Dr. Shawnie Pons.  Patient tolerated injection well.

## 2011-04-03 ENCOUNTER — Encounter: Payer: Medicaid Other | Admitting: Family Medicine

## 2011-04-04 ENCOUNTER — Ambulatory Visit (INDEPENDENT_AMBULATORY_CARE_PROVIDER_SITE_OTHER): Payer: Medicaid Other | Admitting: Advanced Practice Midwife

## 2011-04-04 DIAGNOSIS — Z3480 Encounter for supervision of other normal pregnancy, unspecified trimester: Secondary | ICD-10-CM

## 2011-04-04 MED ORDER — ZOLPIDEM TARTRATE 5 MG PO TABS: 5.0000 mg | ORAL_TABLET | Freq: Every evening | ORAL | Status: DC | PRN

## 2011-04-04 NOTE — Progress Notes (Signed)
Routine prenatal check. 

## 2011-04-04 NOTE — Progress Notes (Signed)
Lost weight same time last pregnancy. No vomiting. Lab results reviewed. There was a urine culture in Nov. Which showed 20k col of enterococcus. No symptoms Will treat if clinically indicated.  Declines genetic screening. Having trouble sleeping, not helped with Benadryl. Slept well last preg. Will try short term Ambien. Discussed sleep habits.

## 2011-04-04 NOTE — Patient Instructions (Signed)
Pregnancy - First Trimester During sexual intercourse, millions of sperm go into the vagina. Only 1 sperm will penetrate and fertilize the female egg while it is in the Fallopian tube. One week later, the fertilized egg implants into the wall of the uterus. An embryo begins to develop into a baby. At 6 to 8 weeks, the eyes and face are formed and the heartbeat can be seen on ultrasound. At the end of 12 weeks (first trimester), all the baby's organs are formed. Now that you are pregnant, you will want to do everything you can to have a healthy baby. Two of the most important things are to get good prenatal care and follow your caregiver's instructions. Prenatal care is all the medical care you receive before the baby's birth. It is given to prevent, find, and treat problems during the pregnancy and childbirth. PRENATAL EXAMS  During prenatal visits, your weight, blood pressure and urine are checked. This is done to make sure you are healthy and progressing normally during the pregnancy.   A pregnant woman should gain 25 to 35 pounds during the pregnancy. However, if you are over weight or underweight, your caregiver will advise you regarding your weight.   Your caregiver will ask and answer questions for you.   Blood work, cervical cultures, other necessary tests and a Pap test are done during your prenatal exams. These tests are done to check on your health and the probable health of your baby. Tests are strongly recommended and done for HIV with your permission. This is the virus that causes AIDS. These tests are done because medications can be given to help prevent your baby from being born with this infection should you have been infected without knowing it. Blood work is also used to find out your blood type, previous infections and follow your blood levels (hemoglobin).   Low hemoglobin (anemia) is common during pregnancy. Iron and vitamins are given to help prevent this. Later in the pregnancy,  blood tests for diabetes will be done along with any other tests if any problems develop. You may need tests to make sure you and the baby are doing well.   You may need other tests to make sure you and the baby are doing well.  CHANGES DURING THE FIRST TRIMESTER (THE FIRST 3 MONTHS OF PREGNANCY) Your body goes through many changes during pregnancy. They vary from person to person. Talk to your caregiver about changes you notice and are concerned about. Changes can include:  Your menstrual period stops.   The egg and sperm carry the genes that determine what you look like. Genes from you and your partner are forming a baby. The female genes determine whether the baby is a boy or a girl.   Your body increases in girth and you may feel bloated.   Feeling sick to your stomach (nauseous) and throwing up (vomiting). If the vomiting is uncontrollable, call your caregiver.   Your breasts will begin to enlarge and become tender.   Your nipples may stick out more and become darker.   The need to urinate more. Painful urination may mean you have a bladder infection.   Tiring easily.   Loss of appetite.   Cravings for certain kinds of food.   At first, you may gain or lose a couple of pounds.   You may have changes in your emotions from day to day (excited to be pregnant or concerned something may go wrong with the pregnancy and baby).     You may have more vivid and strange dreams.  HOME CARE INSTRUCTIONS   It is very important to avoid all smoking, alcohol and un-prescribed drugs during your pregnancy. These affect the formation and growth of the baby. Avoid chemicals while pregnant to ensure the delivery of a healthy infant.   Start your prenatal visits by the 12th week of pregnancy. They are usually scheduled monthly at first, then more often in the last 2 months before delivery. Keep your caregiver's appointments. Follow your caregiver's instructions regarding medication use, blood and lab  tests, exercise, and diet.   During pregnancy, you are providing food for you and your baby. Eat regular, well-balanced meals. Choose foods such as meat, fish, milk and other low fat dairy products, vegetables, fruits, and whole-grain breads and cereals. Your caregiver will tell you of the ideal weight gain.   You can help morning sickness by keeping soda crackers at the bedside. Eat a couple before arising in the morning. You may want to use the crackers without salt on them.   Eating 4 to 5 small meals rather than 3 large meals a day also may help the nausea and vomiting.   Drinking liquids between meals instead of during meals also seems to help nausea and vomiting.   A physical sexual relationship may be continued throughout pregnancy if there are no other problems. Problems may be early (premature) leaking of amniotic fluid from the membranes, vaginal bleeding, or belly (abdominal) pain.   Exercise regularly if there are no restrictions. Check with your caregiver or physical therapist if you are unsure of the safety of some of your exercises. Greater weight gain will occur in the last 2 trimesters of pregnancy. Exercising will help:   Control your weight.   Keep you in shape.   Prepare you for labor and delivery.   Help you lose your pregnancy weight after you deliver your baby.   Wear a good support or jogging bra for breast tenderness during pregnancy. This may help if worn during sleep too.   Ask when prenatal classes are available. Begin classes when they are offered.   Do not use hot tubs, steam rooms or saunas.   Wear your seat belt when driving. This protects you and your baby if you are in an accident.   Avoid raw meat, uncooked cheese, cat litter boxes and soil used by cats throughout the pregnancy. These carry germs that can cause birth defects in the baby.   The first trimester is a good time to visit your dentist for your dental health. Getting your teeth cleaned is  OK. Use a softer toothbrush and brush gently during pregnancy.   Ask for help if you have financial, counseling or nutritional needs during pregnancy. Your caregiver will be able to offer counseling for these needs as well as refer you for other special needs.   Do not take any medications or herbs unless told by your caregiver.   Inform your caregiver if there is any mental or physical domestic violence.   Make a list of emergency phone numbers of family, friends, hospital, and police and fire departments.   Write down your questions. Take them to your prenatal visit.   Do not douche.   Do not cross your legs.   If you have to stand for long periods of time, rotate you feet or take small steps in a circle.   You may have more vaginal secretions that may require a sanitary pad. Do not use   tampons or scented sanitary pads.  MEDICATIONS AND DRUG USE IN PREGNANCY  Take prenatal vitamins as directed. The vitamin should contain 1 milligram of folic acid. Keep all vitamins out of reach of children. Only a couple vitamins or tablets containing iron may be fatal to a baby or young child when ingested.   Avoid use of all medications, including herbs, over-the-counter medications, not prescribed or suggested by your caregiver. Only take over-the-counter or prescription medicines for pain, discomfort, or fever as directed by your caregiver. Do not use aspirin, ibuprofen, or naproxen unless directed by your caregiver.   Let your caregiver also know about herbs you may be using.   Alcohol is related to a number of birth defects. This includes fetal alcohol syndrome. All alcohol, in any form, should be avoided completely. Smoking will cause low birth rate and premature babies.   Street or illegal drugs are very harmful to the baby. They are absolutely forbidden. A baby born to an addicted mother will be addicted at birth. The baby will go through the same withdrawal an adult does.   Let your  caregiver know about any medications that you have to take and for what reason you take them.  MISCARRIAGE IS COMMON DURING PREGNANCY A miscarriage does not mean you did something wrong. It is not a reason to worry about getting pregnant again. Your caregiver will help you with questions you may have. If you have a miscarriage, you may need minor surgery. SEEK MEDICAL CARE IF:  You have any concerns or worries during your pregnancy. It is better to call with your questions if you feel they cannot wait, rather than worry about them. SEEK IMMEDIATE MEDICAL CARE IF:   An unexplained oral temperature above 102 F (38.9 C) develops, or as your caregiver suggests.   You have leaking of fluid from the vagina (birth canal). If leaking membranes are suspected, take your temperature and inform your caregiver of this when you call.   There is vaginal spotting or bleeding. Notify your caregiver of the amount and how many pads are used.   You develop a bad smelling vaginal discharge with a change in the color.   You continue to feel sick to your stomach (nauseated) and have no relief from remedies suggested. You vomit blood or coffee ground-like materials.   You lose more than 2 pounds of weight in 1 week.   You gain more than 2 pounds of weight in 1 week and you notice swelling of your face, hands, feet, or legs.   You gain 5 pounds or more in 1 week (even if you do not have swelling of your hands, face, legs, or feet).   You get exposed to German measles and have never had them.   You are exposed to fifth disease or chickenpox.   You develop belly (abdominal) pain. Round ligament discomfort is a common non-cancerous (benign) cause of abdominal pain in pregnancy. Your caregiver still must evaluate this.   You develop headache, fever, diarrhea, pain with urination, or shortness of breath.   You fall or are in a car accident or have any kind of trauma.   There is mental or physical violence in  your home.  Document Released: 02/28/2001 Document Revised: 11/16/2010 Document Reviewed: 09/01/2008 [employer] Patient Information 2012 [employer]. 

## 2011-04-13 ENCOUNTER — Encounter (HOSPITAL_COMMUNITY): Payer: Self-pay | Admitting: *Deleted

## 2011-04-13 ENCOUNTER — Inpatient Hospital Stay (HOSPITAL_COMMUNITY)
Admission: AD | Admit: 2011-04-13 | Discharge: 2011-04-13 | Disposition: A | Discharge status: Home / Self Care | Payer: Medicaid Other | Source: Ambulatory Visit | Attending: Obstetrics & Gynecology | Admitting: Obstetrics & Gynecology

## 2011-04-13 DIAGNOSIS — O210 Mild hyperemesis gravidarum: Secondary | ICD-10-CM | POA: Insufficient documentation

## 2011-04-13 DIAGNOSIS — O219 Vomiting of pregnancy, unspecified: Secondary | ICD-10-CM

## 2011-04-13 DIAGNOSIS — R51 Headache: Secondary | ICD-10-CM

## 2011-04-13 DIAGNOSIS — O99891 Other specified diseases and conditions complicating pregnancy: Secondary | ICD-10-CM | POA: Insufficient documentation

## 2011-04-13 DIAGNOSIS — O26899 Other specified pregnancy related conditions, unspecified trimester: Secondary | ICD-10-CM

## 2011-04-13 LAB — URINALYSIS, ROUTINE W REFLEX MICROSCOPIC
Glucose, UA: NEGATIVE mg/dL
Hgb urine dipstick: NEGATIVE
Ketones, ur: >80 mg/dL — AB
Leukocytes, UA: NEGATIVE
Protein, ur: NEGATIVE mg/dL
Urobilinogen, UA: 1 mg/dL (ref 0.0–1.0)

## 2011-04-13 MED ORDER — PROMETHAZINE HCL 25 MG/ML IJ SOLN
12.5000 mg | Freq: Four times a day (QID) | INTRAMUSCULAR | Status: DC | PRN
  Administered 2011-04-13: 12.5 mg via INTRAVENOUS
  Filled 2011-04-13: qty 1

## 2011-04-13 MED ORDER — METOCLOPRAMIDE HCL 5 MG/ML IJ SOLN
10.0000 mg | Freq: Once | INTRAMUSCULAR | Status: AC
  Administered 2011-04-13: 10 mg via INTRAVENOUS
  Filled 2011-04-13: qty 2

## 2011-04-13 MED ORDER — PROMETHAZINE HCL 25 MG PO TABS: 25.0000 mg | ORAL_TABLET | Freq: Four times a day (QID) | ORAL | Status: DC | PRN

## 2011-04-13 MED ORDER — LACTATED RINGERS IV BOLUS (SEPSIS)
1000.0000 mL | Freq: Once | INTRAVENOUS | Status: AC
  Administered 2011-04-13: 1000 mL via INTRAVENOUS

## 2011-04-13 MED ORDER — METOCLOPRAMIDE HCL 10 MG PO TABS: 10.0000 mg | ORAL_TABLET | Freq: Four times a day (QID) | ORAL | Status: AC

## 2011-04-13 MED ORDER — DEXAMETHASONE SODIUM PHOSPHATE 10 MG/ML IJ SOLN
10.0000 mg | Freq: Once | INTRAMUSCULAR | Status: AC
  Administered 2011-04-13: 10 mg via INTRAVENOUS
  Filled 2011-04-13: qty 1

## 2011-04-13 MED ORDER — DEXTROSE 5 % IN LACTATED RINGERS IV BOLUS
1000.0000 mL | Freq: Once | INTRAVENOUS | Status: AC
  Administered 2011-04-13: 1000 mL via INTRAVENOUS

## 2011-04-13 NOTE — Progress Notes (Signed)
Pt states her headache started Tuesday night-unrelieved by tylenol-nausea and vomiting started yesterday-the pain startes in left eye and moved to the lower part of head towards her neck

## 2011-04-13 NOTE — ED Provider Notes (Signed)
History     Chief Complaint  Patient presents with  . Headache   HPI 26 y.o. G2P1001 at [redacted]w[redacted]d. Headache since Tuesday, no better with Tylenol, left eye to back of neck, + phonophobia, negative aura and photophobia, n/v x 1 day, no appetite, dry mouth. Taking Zofran ODT, not working. H/O migraines.    Past Medical History  Diagnosis Date  . Rh negative state in antepartum period   . Abnormal Pap smear     Past Surgical History  Procedure Date  . Tonsillectomy     26 yrs old    Family History  Problem Relation Age of Onset  . Diabetes Maternal Grandmother   . Diabetes Maternal Grandfather   . Anesthesia problems Neg Hx   . Hypotension Neg Hx   . Malignant hyperthermia Neg Hx   . Pseudochol deficiency Neg Hx     History  Substance Use Topics  . Smoking status: Never Smoker   . Smokeless tobacco: Never Used  . Alcohol Use: No    Allergies: No Known Allergies  Prescriptions prior to admission  Medication Sig Dispense Refill  . ondansetron (ZOFRAN-ODT) 4 MG disintegrating tablet DISSOLVE 1 TABLET ON THE TONGUE EVERY 6 HOURS AS NEEDED  30 tablet  0  . Prenatal Vit-Fe Fumarate-FA (PRENATAL MULTIVITAMIN) 60-1 MG tablet Take 1 tablet by mouth daily.  30 tablet  11  . zolpidem (AMBIEN) 5 MG tablet Take 1 tablet (5 mg total) by mouth at bedtime as needed for sleep.  30 tablet  0    Review of Systems  Constitutional: Negative.  Negative for fever and chills.  Eyes: Negative for blurred vision, double vision and photophobia.  Respiratory: Negative.   Cardiovascular: Negative.   Gastrointestinal: Positive for nausea and vomiting. Negative for abdominal pain, diarrhea and constipation.  Genitourinary: Negative for dysuria, urgency, frequency, hematuria and flank pain.       Negative for vaginal bleeding, cramping/contractions  Musculoskeletal: Negative.   Neurological: Positive for headaches. Negative for dizziness, sensory change, speech change, focal weakness, seizures and  loss of consciousness.  Psychiatric/Behavioral: Negative.    Physical Exam   Blood pressure 116/65, pulse 105, temperature 98.3 F (36.8 C), temperature source Oral, resp. rate 20, height 5\' 7"  (1.702 m), weight 157 lb (71.215 kg), last menstrual period 01/07/2011, SpO2 99.00%, unknown if currently breastfeeding.  Physical Exam  Nursing note and vitals reviewed. Constitutional: She is oriented to person, place, and time. She appears well-developed and well-nourished. No distress.  HENT:  Head: Normocephalic and atraumatic.  Neck: Normal range of motion. Neck supple.  Cardiovascular: Normal rate.   Respiratory: Effort normal.  GI: Soft. There is no tenderness.  Musculoskeletal: Normal range of motion.  Neurological: She is alert and oriented to person, place, and time. No cranial nerve deficit. Coordination normal.  Skin: Skin is warm and dry.  Psychiatric: She has a normal mood and affect.    MAU Course  Procedures  Results for orders placed during the hospital encounter of 04/13/11 (from the past 24 hour(s))  URINALYSIS, ROUTINE W REFLEX MICROSCOPIC     Status: Abnormal   Collection Time   04/13/11  3:15 AM      Component Value Range   Color, Urine YELLOW  YELLOW    APPearance CLEAR  CLEAR    Specific Gravity, Urine >1.030 (*) 1.005 - 1.030    pH 5.5  5.0 - 8.0    Glucose, UA NEGATIVE  NEGATIVE (mg/dL)   Hgb urine dipstick NEGATIVE  NEGATIVE    Bilirubin Urine SMALL (*) NEGATIVE    Ketones, ur >80 (*) NEGATIVE (mg/dL)   Protein, ur NEGATIVE  NEGATIVE (mg/dL)   Urobilinogen, UA 1.0  0.0 - 1.0 (mg/dL)   Nitrite NEGATIVE  NEGATIVE    Leukocytes, UA NEGATIVE  NEGATIVE     Feeling better with IV hydration, Decadron 10 mg IV, Phenergan 12.5 mg IV and Reglan 10 mg IV. Tolerating PO.  Assessment and Plan  26 y.o. G2P1001 at [redacted]w[redacted]d Headache and nausea/vomiting in pregnancy Rx [place] and Phenergan F/U as scheduled or sooner PRN  Kanasia Gayman 04/13/2011, 3:41 AM

## 2011-04-17 ENCOUNTER — Encounter: Payer: Medicaid Other | Admitting: Obstetrics & Gynecology

## 2011-04-18 ENCOUNTER — Ambulatory Visit (INDEPENDENT_AMBULATORY_CARE_PROVIDER_SITE_OTHER): Payer: Medicaid Other | Admitting: Nurse Practitioner

## 2011-04-18 ENCOUNTER — Encounter: Payer: Self-pay | Admitting: Nurse Practitioner

## 2011-04-18 ENCOUNTER — Ambulatory Visit: Payer: Medicaid Other | Admitting: Nurse Practitioner

## 2011-04-18 DIAGNOSIS — F411 Generalized anxiety disorder: Secondary | ICD-10-CM

## 2011-04-18 DIAGNOSIS — G43009 Migraine without aura, not intractable, without status migrainosus: Secondary | ICD-10-CM | POA: Insufficient documentation

## 2011-04-18 DIAGNOSIS — G43909 Migraine, unspecified, not intractable, without status migrainosus: Secondary | ICD-10-CM

## 2011-04-18 DIAGNOSIS — R112 Nausea with vomiting, unspecified: Secondary | ICD-10-CM

## 2011-04-18 DIAGNOSIS — F419 Anxiety disorder, unspecified: Secondary | ICD-10-CM | POA: Insufficient documentation

## 2011-04-18 MED ORDER — PROMETHAZINE HCL 25 MG PO TABS: 25.0000 mg | ORAL_TABLET | Freq: Four times a day (QID) | ORAL | Status: AC | PRN

## 2011-04-18 MED ORDER — SUMATRIPTAN SUCCINATE 100 MG PO TABS: 100.0000 mg | ORAL_TABLET | Freq: Once | ORAL | Status: DC | PRN

## 2011-04-18 NOTE — Progress Notes (Unsigned)
Patient is here for Migraines.  She is also having increased nausea and vomiting and continues to lose weight.  She is using Zofran and Phenergan for her vomiting.  Using alternating Ambien and Benadryl to help her sleep.

## 2011-04-18 NOTE — Patient Instructions (Signed)

## 2011-04-18 NOTE — Progress Notes (Signed)
Diagnosis: Migraine in Pregnancy  History: Pt is [redacted]w[redacted]d pregnant. G2P1.This is planned pregnancy.  She has had migraine without aura since [employer]. She was diagnosed and treated in High School with Lexapro, Maxalt and phenergan. She has done very well until this pregnancy. It started with her nausea and vomiting and becoming dehydrated.  Her headache starts at about 4 am and it is a 6-7 on 1-10 scale in pain. She gets up and takes 2 tylenol and falls back to sleep. The headache goes away about 50% of the time. The headache will return after lunch and she will repeat the tylenol up to 8-10 tabs per day. Her average tylenol intake is 4-6 tabs.   Location:Left or right temple or both  Number of Headache days/month: Severe:0 Moderate:30 Mild:0  Current Outpatient Prescriptions on File Prior to Visit  Medication Sig Dispense Refill  . ondansetron (ZOFRAN-ODT) 4 MG disintegrating tablet DISSOLVE 1 TABLET ON THE TONGUE EVERY 6 HOURS AS NEEDED  30 tablet  0  . Prenatal Vit-Fe Fumarate-FA (PRENATAL MULTIVITAMIN) 60-1 MG tablet Take 1 tablet by mouth daily.  30 tablet  11  . promethazine (PHENERGAN) 25 MG tablet Take 1 tablet (25 mg total) by mouth every 6 (six) hours as needed for nausea.  60 tablet  0  . zolpidem (AMBIEN) 5 MG tablet Take 1 tablet (5 mg total) by mouth at bedtime as needed for sleep.  30 tablet  0  . metoCLOPramide (REGLAN) 10 MG tablet Take 1 tablet (10 mg total) by mouth 4 (four) times daily.  30 tablet  1   Current Facility-Administered Medications on File Prior to Visit  Medication Dose Route Frequency Provider Last Rate Last Dose  . rho(d) immune globulin (RHIG/Rhophylac) injection 300 mcg  300 mcg Intramuscular Once Reva Bores, MD        Acute prevention:tylenol  Past Medical History  Diagnosis Date  . Rh negative state in antepartum period   . Abnormal Pap smear    Past Surgical History  Procedure Date  . Tonsillectomy     26 yrs old   Family History    Problem Relation Age of Onset  . Diabetes Maternal Grandmother   . Diabetes Maternal Grandfather   . Anesthesia problems Neg Hx   . Hypotension Neg Hx   . Malignant hyperthermia Neg Hx   . Pseudochol deficiency Neg Hx    Social History:  reports that she has never smoked. She has never used smokeless tobacco. She reports that she does not drink alcohol or use illicit drugs. Allergies: No Known Allergies  Triggers: stress  Birth control:pregnant  MWU:[id number] for nausea and migraine as well as anxiety   General: no acute distress, crying HEENT: negative Cardiac:tachycardia Lungs: clear Neuro:anxious otherwise negative Skin: warm and dry  Impression:migraine - common, rebound headache due to excessive medication use  Plan: We had a lengthy discussion today. First she is offered nonmedication management to include ice, heat, exercise, yoga, massage. She will be set up for an appointment with Dr Merry Proud for management of her anxiety. She declines restart of Lexapro at this time. She is asked to decrease amount of tylenol she is using daily. She may use phenergan prn. We discussed the risk benefit ratio in using Imitrex and she is willing to assume risks. She will use this to try to avoid so much tylenol with she is likely rebounding from. She will RTC one month or prn   Time Spent:45 min

## 2011-04-19 ENCOUNTER — Ambulatory Visit (INDEPENDENT_AMBULATORY_CARE_PROVIDER_SITE_OTHER): Payer: Medicaid Other | Admitting: Psychiatry

## 2011-04-19 DIAGNOSIS — F4323 Adjustment disorder with mixed anxiety and depressed mood: Secondary | ICD-10-CM

## 2011-04-26 ENCOUNTER — Ambulatory Visit: Payer: Medicaid Other | Admitting: Psychiatry

## 2011-05-02 ENCOUNTER — Ambulatory Visit (INDEPENDENT_AMBULATORY_CARE_PROVIDER_SITE_OTHER): Payer: Medicaid Other | Admitting: Family Medicine

## 2011-05-02 VITALS — BP 101/20 | Wt 153.0 lb

## 2011-05-02 DIAGNOSIS — Z3480 Encounter for supervision of other normal pregnancy, unspecified trimester: Secondary | ICD-10-CM

## 2011-05-02 NOTE — Patient Instructions (Signed)
Pregnancy - Second Trimester The second trimester of pregnancy (3 to 6 months) is a period of rapid growth for you and your baby. At the end of the sixth month, your baby is about 9 inches long and weighs 1 1/2 pounds. You will begin to feel the baby move between 18 and 20 weeks of the pregnancy. This is called quickening. Weight gain is faster. A clear fluid (colostrum) may leak out of your breasts. You may feel small contractions of the womb (uterus). This is known as false labor or Braxton-Hicks contractions. This is like a practice for labor when the baby is ready to be born. Usually, the problems with morning sickness have usually passed by the end of your first trimester. Some women develop small dark blotches (called cholasma, mask of pregnancy) on their face that usually goes away after the baby is born. Exposure to the sun makes the blotches worse. Acne may also develop in some pregnant women and pregnant women who have acne, may find that it goes away. PRENATAL EXAMS  Blood work may continue to be done during prenatal exams. These tests are done to check on your health and the probable health of your baby. Blood work is used to follow your blood levels (hemoglobin). Anemia (low hemoglobin) is common during pregnancy. Iron and vitamins are given to help prevent this. You will also be checked for diabetes between 24 and 28 weeks of the pregnancy. Some of the previous blood tests may be repeated.   The size of the uterus is measured during each visit. This is to make sure that the baby is continuing to grow properly according to the dates of the pregnancy.   Your blood pressure is checked every prenatal visit. This is to make sure you are not getting toxemia.   Your urine is checked to make sure you do not have an infection, diabetes or protein in the urine.   Your weight is checked often to make sure gains are happening at the suggested rate. This is to ensure that both you and your baby are  growing normally.   Sometimes, an ultrasound is performed to confirm the proper growth and development of the baby. This is a test which bounces harmless sound waves off the baby so your caregiver can more accurately determine due dates.  Sometimes, a specialized test is done on the amniotic fluid surrounding the baby. This test is called an amniocentesis. The amniotic fluid is obtained by sticking a needle into the belly (abdomen). This is done to check the chromosomes in instances where there is a concern about possible genetic problems with the baby. It is also sometimes done near the end of pregnancy if an early delivery is required. In this case, it is done to help make sure the baby's lungs are mature enough for the baby to live outside of the womb. CHANGES OCCURING IN THE SECOND TRIMESTER OF PREGNANCY Your body goes through many changes during pregnancy. They vary from person to person. Talk to your caregiver about changes you notice that you are concerned about.  During the second trimester, you will likely have an increase in your appetite. It is normal to have cravings for certain foods. This varies from person to person and pregnancy to pregnancy.   Your lower abdomen will begin to bulge.   You may have to urinate more often because the uterus and baby are pressing on your bladder. It is also common to get more bladder infections during pregnancy (  pain with urination). You can help this by drinking lots of fluids and emptying your bladder before and after intercourse.   You may begin to get stretch marks on your hips, abdomen, and breasts. These are normal changes in the body during pregnancy. There are no exercises or medications to take that prevent this change.   You may begin to develop swollen and bulging veins (varicose veins) in your legs. Wearing support hose, elevating your feet for 15 minutes, 3 to 4 times a day and limiting salt in your diet helps lessen the problem.    Heartburn may develop as the uterus grows and pushes up against the stomach. Antacids recommended by your caregiver helps with this problem. Also, eating smaller meals 4 to 5 times a day helps.   Constipation can be treated with a stool softener or adding bulk to your diet. Drinking lots of fluids, vegetables, fruits, and whole grains are helpful.   Exercising is also helpful. If you have been very active up until your pregnancy, most of these activities can be continued during your pregnancy. If you have been less active, it is helpful to start an exercise program such as walking.   Hemorrhoids (varicose veins in the rectum) may develop at the end of the second trimester. Warm sitz baths and hemorrhoid cream recommended by your caregiver helps hemorrhoid problems.   Backaches may develop during this time of your pregnancy. Avoid heavy lifting, wear low heal shoes and practice good posture to help with backache problems.   Some pregnant women develop tingling and numbness of their hand and fingers because of swelling and tightening of ligaments in the wrist (carpel tunnel syndrome). This goes away after the baby is born.   As your breasts enlarge, you may have to get a bigger bra. Get a comfortable, cotton, support bra. Do not get a nursing bra until the last month of the pregnancy if you will be nursing the baby.   You may get a dark line from your belly button to the pubic area called the linea nigra.   You may develop rosy cheeks because of increase blood flow to the face.   You may develop spider looking lines of the face, neck, arms and chest. These go away after the baby is born.  HOME CARE INSTRUCTIONS   It is extremely important to avoid all smoking, herbs, alcohol, and unprescribed drugs during your pregnancy. These chemicals affect the formation and growth of the baby. Avoid these chemicals throughout the pregnancy to ensure the delivery of a healthy infant.   Most of your home  care instructions are the same as suggested for the first trimester of your pregnancy. Keep your caregiver's appointments. Follow your caregiver's instructions regarding medication use, exercise and diet.   During pregnancy, you are providing food for you and your baby. Continue to eat regular, well-balanced meals. Choose foods such as meat, fish, milk and other low fat dairy products, vegetables, fruits, and whole-grain breads and cereals. Your caregiver will tell you of the ideal weight gain.   A physical sexual relationship may be continued up until near the end of pregnancy if there are no other problems. Problems could include early (premature) leaking of amniotic fluid from the membranes, vaginal bleeding, abdominal pain, or other medical or pregnancy problems.   Exercise regularly if there are no restrictions. Check with your caregiver if you are unsure of the safety of some of your exercises. The greatest weight gain will occur in the   last 2 trimesters of pregnancy. Exercise will help you:   Control your weight.   Get you in shape for labor and delivery.   Lose weight after you have the baby.   Wear a good support or jogging bra for breast tenderness during pregnancy. This may help if worn during sleep. Pads or tissues may be used in the bra if you are leaking colostrum.   Do not use hot tubs, steam rooms or saunas throughout the pregnancy.   Wear your seat belt at all times when driving. This protects you and your baby if you are in an accident.   Avoid raw meat, uncooked cheese, cat litter boxes and soil used by cats. These carry germs that can cause birth defects in the baby.   The second trimester is also a good time to visit your dentist for your dental health if this has not been done yet. Getting your teeth cleaned is OK. Use a soft toothbrush. Brush gently during pregnancy.   It is easier to loose urine during pregnancy. Tightening up and strengthening the pelvic muscles will  help with this problem. Practice stopping your urination while you are going to the bathroom. These are the same muscles you need to strengthen. It is also the muscles you would use as if you were trying to stop from passing gas. You can practice tightening these muscles up 10 times a set and repeating this about 3 times per day. Once you know what muscles to tighten up, do not perform these exercises during urination. It is more likely to contribute to an infection by backing up the urine.   Ask for help if you have financial, counseling or nutritional needs during pregnancy. Your caregiver will be able to offer counseling for these needs as well as refer you for other special needs.   Your skin may become oily. If so, wash your face with mild soap, use non-greasy moisturizer and oil or cream based makeup.  MEDICATIONS AND DRUG USE IN PREGNANCY  Take prenatal vitamins as directed. The vitamin should contain 1 milligram of folic acid. Keep all vitamins out of reach of children. Only a couple vitamins or tablets containing iron may be fatal to a baby or young child when ingested.   Avoid use of all medications, including herbs, over-the-counter medications, not prescribed or suggested by your caregiver. Only take over-the-counter or prescription medicines for pain, discomfort, or fever as directed by your caregiver. Do not use aspirin.   Let your caregiver also know about herbs you may be using.   Alcohol is related to a number of birth defects. This includes fetal alcohol syndrome. All alcohol, in any form, should be avoided completely. Smoking will cause low birth rate and premature babies.   Street or illegal drugs are very harmful to the baby. They are absolutely forbidden. A baby born to an addicted mother will be addicted at birth. The baby will go through the same withdrawal an adult does.  SEEK MEDICAL CARE IF:  You have any concerns or worries during your pregnancy. It is better to call with  your questions if you feel they cannot wait, rather than worry about them. SEEK IMMEDIATE MEDICAL CARE IF:   An unexplained oral temperature above 102 F (38.9 C) develops, or as your caregiver suggests.   You have leaking of fluid from the vagina (birth canal). If leaking membranes are suspected, take your temperature and tell your caregiver of this when you call.   There   is vaginal spotting, bleeding, or passing clots. Tell your caregiver of the amount and how many pads are used. Light spotting in pregnancy is common, especially following intercourse.   You develop a bad smelling vaginal discharge with a change in the color from clear to white.   You continue to feel sick to your stomach (nauseated) and have no relief from remedies suggested. You vomit blood or coffee ground-like materials.   You lose more than 2 pounds of weight or gain more than 2 pounds of weight over 1 week, or as suggested by your caregiver.   You notice swelling of your face, hands, feet, or legs.   You get exposed to German measles and have never had them.   You are exposed to fifth disease or chickenpox.   You develop belly (abdominal) pain. Round ligament discomfort is a common non-cancerous (benign) cause of abdominal pain in pregnancy. Your caregiver still must evaluate you.   You develop a bad headache that does not go away.   You develop fever, diarrhea, pain with urination, or shortness of breath.   You develop visual problems, blurry, or double vision.   You fall or are in a car accident or any kind of trauma.   There is mental or physical violence at home.  Document Released: 02/28/2001 Document Revised: 11/16/2010 Document Reviewed: 09/02/2008 [employer] Patient Information 2012 [employer]. Breastfeeding BENEFITS OF BREASTFEEDING For the baby  The first milk (colostrum) helps the baby's digestive system function better.   There are antibodies from the mother in the milk that help the  baby fight off infections.   The baby has a lower incidence of asthma, allergies, and SIDS (sudden infant death syndrome).   The nutrients in breast milk are better than formulas for the baby and helps the baby's brain grow better.   Babies who breastfeed have less gas, colic, and constipation.  For the mother  Breastfeeding helps develop a very special bond between mother and baby.   It is more convenient, always available at the correct temperature and cheaper than formula feeding.   It burns calories in the mother and helps with losing weight that was gained during pregnancy.   It makes the uterus contract back down to normal size faster and slows bleeding following delivery.   Breastfeeding mothers have a lower risk of developing breast cancer.  NURSE FREQUENTLY  A healthy, full-term baby may breastfeed as often as every hour or space his or her feedings to every 3 hours.   How often to nurse will vary from baby to baby. Watch your baby for signs of hunger, not the clock.   Nurse as often as the baby requests, or when you feel the need to reduce the fullness of your breasts.   Awaken the baby if it has been 3 to 4 hours since the last feeding.   Frequent feeding will help the mother make more milk and will prevent problems like sore nipples and engorgement of the breasts.  BABY'S POSITION AT THE BREAST  Whether lying down or sitting, be sure that the baby's tummy is facing your tummy.   Support the breast with 4 fingers underneath the breast and the thumb above. Make sure your fingers are well away from the nipple and baby's mouth.   Stroke the baby's lips and cheek closest to the breast gently with your finger or nipple.   When the baby's mouth is open wide enough, place all of your nipple and as much   of the dark area around the nipple as possible into your baby's mouth.   Pull the baby in close so the tip of the nose and the baby's cheeks touch the breast during the  feeding.  FEEDINGS  The length of each feeding varies from baby to baby and from feeding to feeding.   The baby must suck about 2 to 3 minutes for your milk to get to him or her. This is called a "let down." For this reason, allow the baby to feed on each breast as long as he or she wants. Your baby will end the feeding when he or she has received the right balance of nutrients.   To break the suction, put your finger into the corner of the baby's mouth and slide it between his or her gums before removing your breast from his or her mouth. This will help prevent sore nipples.  REDUCING BREAST ENGORGEMENT  In the first week after your baby is born, you may experience signs of breast engorgement. When breasts are engorged, they feel heavy, warm, full, and may be tender to the touch. You can reduce engorgement if you:   Nurse frequently, every 2 to 3 hours. Mothers who breastfeed early and often have fewer problems with engorgement.   Place light ice packs on your breasts between feedings. This reduces swelling. Wrap the ice packs in a lightweight towel to protect your skin.   Apply moist hot packs to your breast for 5 to 10 minutes before each feeding. This increases circulation and helps the milk flow.   Gently massage your breast before and during the feeding.   Make sure that the baby empties at least one breast at every feeding before switching sides.   Use a breast pump to empty the breasts if your baby is sleepy or not nursing well. You may also want to pump if you are returning to work or or you feel you are getting engorged.   Avoid bottle feeds, pacifiers or supplemental feedings of water or juice in place of breastfeeding.   Be sure the baby is latched on and positioned properly while breastfeeding.   Prevent fatigue, stress, and anemia.   Wear a supportive bra, avoiding underwire styles.   Eat a balanced diet with enough fluids.  If you follow these suggestions, your  engorgement should improve in 24 to 48 hours. If you are still experiencing difficulty, call your lactation consultant or caregiver. IS MY BABY GETTING ENOUGH MILK? Sometimes, mothers worry about whether their babies are getting enough milk. You can be assured that your baby is getting enough milk if:  The baby is actively sucking and you hear swallowing.   The baby nurses at least 8 to 12 times in a 24 hour time period. Nurse your baby until he or she unlatches or falls asleep at the first breast (at least 10 to 20 minutes), then offer the second side.   The baby is wetting 5 to 6 disposable diapers (6 to 8 cloth diapers) in a 24 hour period by 5 to 6 days of age.   The baby is having at least 2 to 3 stools every 24 hours for the first few months. Breast milk is all the food your baby needs. It is not necessary for your baby to have water or formula. In fact, to help your breasts make more milk, it is best not to give your baby supplemental feedings during the early weeks.   The stool should   be soft and yellow.   The baby should gain 4 to 7 ounces per week after he is 4 days old.  TAKE CARE OF YOURSELF Take care of your breasts by:  Bathing or showering daily.   Avoiding the use of soaps on your nipples.   Start feedings on your left breast at one feeding and on your right breast at the next feeding.   You will notice an increase in your milk supply 2 to 5 days after delivery. You may feel some discomfort from engorgement, which makes your breasts very firm and often tender. Engorgement "peaks" out within 24 to 48 hours. In the meantime, apply warm moist towels to your breasts for 5 to 10 minutes before feeding. Gentle massage and expression of some milk before feeding will soften your breasts, making it easier for your baby to latch on. Wear a well fitting nursing bra and air dry your nipples for 10 to 15 minutes after each feeding.   Only use cotton bra pads.   Only use pure lanolin on  your nipples after nursing. You do not need to wash it off before nursing.  Take care of yourself by:   Eating well-balanced meals and nutritious snacks.   Drinking milk, fruit juice, and water to satisfy your thirst (about 8 glasses a day).   Getting plenty of rest.   Increasing calcium in your diet (1200 mg a day).   Avoiding foods that you notice affect the baby in a bad way.  SEEK MEDICAL CARE IF:   You have any questions or difficulty with breastfeeding.   You need help.   You have a hard, red, sore area on your breast, accompanied by a fever of 100.5 F (38.1 C) or more.   Your baby is too sleepy to eat well or is having trouble sleeping.   Your baby is wetting less than 6 diapers per day, by 5 days of age.   Your baby's skin or white part of his or her eyes is more yellow than it was in the hospital.   You feel depressed.  Document Released: 03/06/2005 Document Revised: 11/16/2010 Document Reviewed: 10/19/2008 [employer] Patient Information 2012 [employer]. 

## 2011-05-02 NOTE — Progress Notes (Signed)
Patient is here for routine prenatal check.  She is also following up on her migraines.  She is doing much better with the migraines and has been able to eat more although she has still lost another pound.

## 2011-05-02 NOTE — Progress Notes (Signed)
She is doing well, nausea is better.  Still with trouble with headaches.  Schedule anatomy.

## 2011-05-03 LAB — GC/CHLAMYDIA PROBE AMP, URINE
Chlamydia, Swab/Urine, PCR: NEGATIVE
GC Probe Amp, Urine: NEGATIVE

## 2011-05-16 ENCOUNTER — Ambulatory Visit (INDEPENDENT_AMBULATORY_CARE_PROVIDER_SITE_OTHER): Payer: Medicaid Other | Admitting: Nurse Practitioner

## 2011-05-16 ENCOUNTER — Encounter: Payer: Self-pay | Admitting: Nurse Practitioner

## 2011-05-16 DIAGNOSIS — G43909 Migraine, unspecified, not intractable, without status migrainosus: Secondary | ICD-10-CM

## 2011-05-16 DIAGNOSIS — F419 Anxiety disorder, unspecified: Secondary | ICD-10-CM

## 2011-05-16 DIAGNOSIS — F411 Generalized anxiety disorder: Secondary | ICD-10-CM

## 2011-05-16 MED ORDER — SUMATRIPTAN SUCCINATE 100 MG PO TABS: 100.0000 mg | ORAL_TABLET | Freq: Once | ORAL | Status: DC | PRN

## 2011-05-16 MED ORDER — ESCITALOPRAM OXALATE 20 MG PO TABS: 20.0000 mg | ORAL_TABLET | Freq: Every day | ORAL | Status: DC

## 2011-05-16 NOTE — Progress Notes (Signed)
S: Pt comes to office today for follow up on migraines in pregnancy. She is [redacted]w[redacted]d pregnant. She was seen last month with daily headache and overuse of tylenol. Since our last visit she is much improved and is now only having one headache per week and it responds to 1/2 tab of Imitrex. She saw Dr Noe Gens and is doing better with emotional issues. She has had 2 massages and neck is improved. Her weight is up 3 lbs in last 2 weeks. She does have some concerns with postpartum depression. She has asked for an rx for Lexapro for postpartum. She does not plan to breast feed. She is considering [place] IUD for contraception.   O: Alert, oriented, NAD FHT 148 Neuro negative  A: Migraine in pregnancy  P: Will refill her Imitrex as needed Info on Mirenia  Lexapro 20mg  for postpartum use if needed F/U with Dr Shawnie Pons if she feels she need to take Lexapro longer than 2 months postpartum RTC prn

## 2011-05-16 NOTE — Patient Instructions (Signed)

## 2011-05-18 ENCOUNTER — Encounter: Payer: Self-pay | Admitting: Family Medicine

## 2011-05-18 ENCOUNTER — Ambulatory Visit (HOSPITAL_COMMUNITY)
Admission: RE | Admit: 2011-05-18 | Discharge: 2011-05-18 | Disposition: A | Discharge status: Home / Self Care | Payer: Medicaid Other | Source: Ambulatory Visit | Attending: Family Medicine | Admitting: Family Medicine

## 2011-05-18 DIAGNOSIS — Z363 Encounter for antenatal screening for malformations: Secondary | ICD-10-CM | POA: Insufficient documentation

## 2011-05-18 DIAGNOSIS — Z1389 Encounter for screening for other disorder: Secondary | ICD-10-CM | POA: Insufficient documentation

## 2011-05-18 DIAGNOSIS — Z3480 Encounter for supervision of other normal pregnancy, unspecified trimester: Secondary | ICD-10-CM

## 2011-05-18 DIAGNOSIS — O358XX0 Maternal care for other (suspected) fetal abnormality and damage, not applicable or unspecified: Secondary | ICD-10-CM | POA: Insufficient documentation

## 2011-05-24 ENCOUNTER — Ambulatory Visit (INDEPENDENT_AMBULATORY_CARE_PROVIDER_SITE_OTHER): Payer: Medicaid Other | Admitting: Obstetrics & Gynecology

## 2011-05-24 DIAGNOSIS — F411 Generalized anxiety disorder: Secondary | ICD-10-CM

## 2011-05-24 DIAGNOSIS — Z3480 Encounter for supervision of other normal pregnancy, unspecified trimester: Secondary | ICD-10-CM

## 2011-05-24 DIAGNOSIS — F419 Anxiety disorder, unspecified: Secondary | ICD-10-CM

## 2011-05-24 DIAGNOSIS — O360990 Maternal care for other rhesus isoimmunization, unspecified trimester, not applicable or unspecified: Secondary | ICD-10-CM

## 2011-05-24 DIAGNOSIS — Z6791 Unspecified blood type, Rh negative: Secondary | ICD-10-CM

## 2011-05-24 NOTE — Patient Instructions (Signed)
Pregnancy - Second Trimester The second trimester of pregnancy (3 to 6 months) is a period of rapid growth for you and your baby. At the end of the sixth month, your baby is about 9 inches long and weighs 1 1/2 pounds. You will begin to feel the baby move between 18 and 20 weeks of the pregnancy. This is called quickening. Weight gain is faster. A clear fluid (colostrum) may leak out of your breasts. You may feel small contractions of the womb (uterus). This is known as false labor or Braxton-Hicks contractions. This is like a practice for labor when the baby is ready to be born. Usually, the problems with morning sickness have usually passed by the end of your first trimester. Some women develop small dark blotches (called cholasma, mask of pregnancy) on their face that usually goes away after the baby is born. Exposure to the sun makes the blotches worse. Acne may also develop in some pregnant women and pregnant women who have acne, may find that it goes away. PRENATAL EXAMS  Blood work may continue to be done during prenatal exams. These tests are done to check on your health and the probable health of your baby. Blood work is used to follow your blood levels (hemoglobin). Anemia (low hemoglobin) is common during pregnancy. Iron and vitamins are given to help prevent this. You will also be checked for diabetes between 24 and 28 weeks of the pregnancy. Some of the previous blood tests may be repeated.   The size of the uterus is measured during each visit. This is to make sure that the baby is continuing to grow properly according to the dates of the pregnancy.   Your blood pressure is checked every prenatal visit. This is to make sure you are not getting toxemia.   Your urine is checked to make sure you do not have an infection, diabetes or protein in the urine.   Your weight is checked often to make sure gains are happening at the suggested rate. This is to ensure that both you and your baby are  growing normally.   Sometimes, an ultrasound is performed to confirm the proper growth and development of the baby. This is a test which bounces harmless sound waves off the baby so your caregiver can more accurately determine due dates.  Sometimes, a specialized test is done on the amniotic fluid surrounding the baby. This test is called an amniocentesis. The amniotic fluid is obtained by sticking a needle into the belly (abdomen). This is done to check the chromosomes in instances where there is a concern about possible genetic problems with the baby. It is also sometimes done near the end of pregnancy if an early delivery is required. In this case, it is done to help make sure the baby's lungs are mature enough for the baby to live outside of the womb. CHANGES OCCURING IN THE SECOND TRIMESTER OF PREGNANCY Your body goes through many changes during pregnancy. They vary from person to person. Talk to your caregiver about changes you notice that you are concerned about.  During the second trimester, you will likely have an increase in your appetite. It is normal to have cravings for certain foods. This varies from person to person and pregnancy to pregnancy.   Your lower abdomen will begin to bulge.   You may have to urinate more often because the uterus and baby are pressing on your bladder. It is also common to get more bladder infections during pregnancy (  pain with urination). You can help this by drinking lots of fluids and emptying your bladder before and after intercourse.   You may begin to get stretch marks on your hips, abdomen, and breasts. These are normal changes in the body during pregnancy. There are no exercises or medications to take that prevent this change.   You may begin to develop swollen and bulging veins (varicose veins) in your legs. Wearing support hose, elevating your feet for 15 minutes, 3 to 4 times a day and limiting salt in your diet helps lessen the problem.    Heartburn may develop as the uterus grows and pushes up against the stomach. Antacids recommended by your caregiver helps with this problem. Also, eating smaller meals 4 to 5 times a day helps.   Constipation can be treated with a stool softener or adding bulk to your diet. Drinking lots of fluids, vegetables, fruits, and whole grains are helpful.   Exercising is also helpful. If you have been very active up until your pregnancy, most of these activities can be continued during your pregnancy. If you have been less active, it is helpful to start an exercise program such as walking.   Hemorrhoids (varicose veins in the rectum) may develop at the end of the second trimester. Warm sitz baths and hemorrhoid cream recommended by your caregiver helps hemorrhoid problems.   Backaches may develop during this time of your pregnancy. Avoid heavy lifting, wear low heal shoes and practice good posture to help with backache problems.   Some pregnant women develop tingling and numbness of their hand and fingers because of swelling and tightening of ligaments in the wrist (carpel tunnel syndrome). This goes away after the baby is born.   As your breasts enlarge, you may have to get a bigger bra. Get a comfortable, cotton, support bra. Do not get a nursing bra until the last month of the pregnancy if you will be nursing the baby.   You may get a dark line from your belly button to the pubic area called the linea nigra.   You may develop rosy cheeks because of increase blood flow to the face.   You may develop spider looking lines of the face, neck, arms and chest. These go away after the baby is born.  HOME CARE INSTRUCTIONS   It is extremely important to avoid all smoking, herbs, alcohol, and unprescribed drugs during your pregnancy. These chemicals affect the formation and growth of the baby. Avoid these chemicals throughout the pregnancy to ensure the delivery of a healthy infant.   Most of your home  care instructions are the same as suggested for the first trimester of your pregnancy. Keep your caregiver's appointments. Follow your caregiver's instructions regarding medication use, exercise and diet.   During pregnancy, you are providing food for you and your baby. Continue to eat regular, well-balanced meals. Choose foods such as meat, fish, milk and other low fat dairy products, vegetables, fruits, and whole-grain breads and cereals. Your caregiver will tell you of the ideal weight gain.   A physical sexual relationship may be continued up until near the end of pregnancy if there are no other problems. Problems could include early (premature) leaking of amniotic fluid from the membranes, vaginal bleeding, abdominal pain, or other medical or pregnancy problems.   Exercise regularly if there are no restrictions. Check with your caregiver if you are unsure of the safety of some of your exercises. The greatest weight gain will occur in the   last 2 trimesters of pregnancy. Exercise will help you:   Control your weight.   Get you in shape for labor and delivery.   Lose weight after you have the baby.   Wear a good support or jogging bra for breast tenderness during pregnancy. This may help if worn during sleep. Pads or tissues may be used in the bra if you are leaking colostrum.   Do not use hot tubs, steam rooms or saunas throughout the pregnancy.   Wear your seat belt at all times when driving. This protects you and your baby if you are in an accident.   Avoid raw meat, uncooked cheese, cat litter boxes and soil used by cats. These carry germs that can cause birth defects in the baby.   The second trimester is also a good time to visit your dentist for your dental health if this has not been done yet. Getting your teeth cleaned is OK. Use a soft toothbrush. Brush gently during pregnancy.   It is easier to loose urine during pregnancy. Tightening up and strengthening the pelvic muscles will  help with this problem. Practice stopping your urination while you are going to the bathroom. These are the same muscles you need to strengthen. It is also the muscles you would use as if you were trying to stop from passing gas. You can practice tightening these muscles up 10 times a set and repeating this about 3 times per day. Once you know what muscles to tighten up, do not perform these exercises during urination. It is more likely to contribute to an infection by backing up the urine.   Ask for help if you have financial, counseling or nutritional needs during pregnancy. Your caregiver will be able to offer counseling for these needs as well as refer you for other special needs.   Your skin may become oily. If so, wash your face with mild soap, use non-greasy moisturizer and oil or cream based makeup.  MEDICATIONS AND DRUG USE IN PREGNANCY  Take prenatal vitamins as directed. The vitamin should contain 1 milligram of folic acid. Keep all vitamins out of reach of children. Only a couple vitamins or tablets containing iron may be fatal to a baby or young child when ingested.   Avoid use of all medications, including herbs, over-the-counter medications, not prescribed or suggested by your caregiver. Only take over-the-counter or prescription medicines for pain, discomfort, or fever as directed by your caregiver. Do not use aspirin.   Let your caregiver also know about herbs you may be using.   Alcohol is related to a number of birth defects. This includes fetal alcohol syndrome. All alcohol, in any form, should be avoided completely. Smoking will cause low birth rate and premature babies.   Street or illegal drugs are very harmful to the baby. They are absolutely forbidden. A baby born to an addicted mother will be addicted at birth. The baby will go through the same withdrawal an adult does.  SEEK MEDICAL CARE IF:  You have any concerns or worries during your pregnancy. It is better to call with  your questions if you feel they cannot wait, rather than worry about them. SEEK IMMEDIATE MEDICAL CARE IF:   An unexplained oral temperature above 102 F (38.9 C) develops, or as your caregiver suggests.   You have leaking of fluid from the vagina (birth canal). If leaking membranes are suspected, take your temperature and tell your caregiver of this when you call.   There   is vaginal spotting, bleeding, or passing clots. Tell your caregiver of the amount and how many pads are used. Light spotting in pregnancy is common, especially following intercourse.   You develop a bad smelling vaginal discharge with a change in the color from clear to white.   You continue to feel sick to your stomach (nauseated) and have no relief from remedies suggested. You vomit blood or coffee ground-like materials.   You lose more than 2 pounds of weight or gain more than 2 pounds of weight over 1 week, or as suggested by your caregiver.   You notice swelling of your face, hands, feet, or legs.   You get exposed to German measles and have never had them.   You are exposed to fifth disease or chickenpox.   You develop belly (abdominal) pain. Round ligament discomfort is a common non-cancerous (benign) cause of abdominal pain in pregnancy. Your caregiver still must evaluate you.   You develop a bad headache that does not go away.   You develop fever, diarrhea, pain with urination, or shortness of breath.   You develop visual problems, blurry, or double vision.   You fall or are in a car accident or any kind of trauma.   There is mental or physical violence at home.  Document Released: 02/28/2001 Document Revised: 02/23/2011 Document Reviewed: 09/02/2008 [employer] Patient Information 2012 [employer]. 

## 2011-05-24 NOTE — Progress Notes (Signed)
Normal anatomy scan.  Declines quad screen.  No other complaintjks or concerns.  Fetal movement and labor precautions reviewed.

## 2011-06-12 ENCOUNTER — Telehealth: Payer: Self-pay | Admitting: *Deleted

## 2011-06-12 DIAGNOSIS — H10029 Other mucopurulent conjunctivitis, unspecified eye: Secondary | ICD-10-CM

## 2011-06-12 MED ORDER — SULFACETAMIDE-PREDNISOLONE 10-0.2 % OP SUSP: 1.0000 [drp] | OPHTHALMIC | Status: AC

## 2011-06-12 NOTE — Telephone Encounter (Signed)
Patient woke up with pink eye yesterday and would like drops called in to help sooth it and help it to get better faster.

## 2011-06-19 ENCOUNTER — Ambulatory Visit (INDEPENDENT_AMBULATORY_CARE_PROVIDER_SITE_OTHER): Payer: Medicaid Other | Admitting: Family Medicine

## 2011-06-19 DIAGNOSIS — Z3480 Encounter for supervision of other normal pregnancy, unspecified trimester: Secondary | ICD-10-CM

## 2011-06-19 NOTE — Progress Notes (Signed)
Doing better---gaining weight after much loss early in pregnancy.

## 2011-06-19 NOTE — Patient Instructions (Signed)

## 2011-06-27 ENCOUNTER — Ambulatory Visit (INDEPENDENT_AMBULATORY_CARE_PROVIDER_SITE_OTHER): Payer: Medicaid Other | Admitting: Family Medicine

## 2011-06-27 VITALS — BP 123/73 | Wt 163.0 lb

## 2011-06-27 DIAGNOSIS — H1010 Acute atopic conjunctivitis, unspecified eye: Secondary | ICD-10-CM

## 2011-06-27 DIAGNOSIS — H1045 Other chronic allergic conjunctivitis: Secondary | ICD-10-CM

## 2011-06-27 MED ORDER — NEOMYCIN-POLYMYXIN-HC 5-10000-1 OP SUSP: 1.0000 [drp] | OPHTHALMIC | Status: AC

## 2011-06-27 MED ORDER — NAPHAZOLINE-PHENIRAMINE 0.025-0.3 % OP SOLN: 1.0000 [drp] | Freq: Four times a day (QID) | OPHTHALMIC | Status: AC | PRN

## 2011-06-27 NOTE — Progress Notes (Signed)
Patient has had an eye irritation for three weeks now.  She did use drops and did not get any relief from them.  She allergy medicine, Claritin with no relief as well.  Other than that she is doing well.  She did have some braxton hicks contractions following intercourse.

## 2011-06-27 NOTE — Patient Instructions (Signed)

## 2011-06-27 NOTE — Progress Notes (Signed)
History Several week h/o left eye redness, burning and crusting.  Trial of abx gtts has not helped.  She has changed contacts on several occasions.  Her Opthalmologist will not see her secondary to insurance.   Physical exam Gen-WD/WN female in NAD Conjunctiva injected on left        Nml sclera   Assessment 1. Allergic conjunctivitis  naphazoline-pheniramine (NAPHCON-A) 0.025-0.3 % ophthalmic solution, neomycin-polymyxin-hydrocortisone (CORTISPORIN) ophthalmic suspension     Plan Trial of anti-allergy meds Cortisporin with some steroid for irrritation Leave contact out for several days.

## 2011-07-13 ENCOUNTER — Encounter: Payer: Self-pay | Admitting: Family Medicine

## 2011-07-13 ENCOUNTER — Ambulatory Visit (INDEPENDENT_AMBULATORY_CARE_PROVIDER_SITE_OTHER): Payer: Medicaid Other | Admitting: Family Medicine

## 2011-07-13 VITALS — BP 125/64 | Wt 166.0 lb

## 2011-07-13 DIAGNOSIS — Z3480 Encounter for supervision of other normal pregnancy, unspecified trimester: Secondary | ICD-10-CM

## 2011-07-13 DIAGNOSIS — Z23 Encounter for immunization: Secondary | ICD-10-CM

## 2011-07-13 DIAGNOSIS — O361190 Maternal care for Anti-A sensitization, unspecified trimester, not applicable or unspecified: Secondary | ICD-10-CM

## 2011-07-13 LAB — CBC
Hemoglobin: 10.2 g/dL — ABNORMAL LOW (ref 12.0–15.0)
MCH: 30 pg (ref 26.0–34.0)
MCV: 92.9 fL (ref 78.0–100.0)
RBC: 3.4 MIL/uL — ABNORMAL LOW (ref 3.87–5.11)

## 2011-07-13 LAB — RPR

## 2011-07-13 MED ORDER — RHO D IMMUNE GLOBULIN 1500 UNIT/2ML IJ SOLN
300.0000 ug | Freq: Once | INTRAMUSCULAR | Status: AC
  Administered 2011-07-13: 300 ug via INTRAMUSCULAR

## 2011-07-13 MED ORDER — TETANUS-DIPHTH-ACELL PERTUSSIS 5-2.5-18.5 LF-MCG/0.5 IM SUSP: 0.5000 mL | Freq: Once | INTRAMUSCULAR | Status: DC

## 2011-07-13 NOTE — Progress Notes (Signed)
Patient is here today for routine exam, glucose tolerance,  TDAP, and Rhophylac.

## 2011-07-13 NOTE — Patient Instructions (Signed)
Breastfeeding BENEFITS OF BREASTFEEDING For the baby  The first milk (colostrum) helps the baby's digestive system function better.   There are antibodies from the mother in the milk that help the baby fight off infections.   The baby has a lower incidence of asthma, allergies, and SIDS (sudden infant death syndrome).   The nutrients in breast milk are better than formulas for the baby and helps the baby's brain grow better.   Babies who breastfeed have less gas, colic, and constipation.  For the mother  Breastfeeding helps develop a very special bond between mother and baby.   It is more convenient, always available at the correct temperature and cheaper than formula feeding.   It burns calories in the mother and helps with losing weight that was gained during pregnancy.   It makes the uterus contract back down to normal size faster and slows bleeding following delivery.   Breastfeeding mothers have a lower risk of developing breast cancer.  NURSE FREQUENTLY  A healthy, full-term baby may breastfeed as often as every hour or space his or her feedings to every 3 hours.   How often to nurse will vary from baby to baby. Watch your baby for signs of hunger, not the clock.   Nurse as often as the baby requests, or when you feel the need to reduce the fullness of your breasts.   Awaken the baby if it has been 3 to 4 hours since the last feeding.   Frequent feeding will help the mother make more milk and will prevent problems like sore nipples and engorgement of the breasts.  BABY'S POSITION AT THE BREAST  Whether lying down or sitting, be sure that the baby's tummy is facing your tummy.   Support the breast with 4 fingers underneath the breast and the thumb above. Make sure your fingers are well away from the nipple and baby's mouth.   Stroke the baby's lips and cheek closest to the breast gently with your finger or nipple.   When the baby's mouth is open wide enough, place all  of your nipple and as much of the dark area around the nipple as possible into your baby's mouth.   Pull the baby in close so the tip of the nose and the baby's cheeks touch the breast during the feeding.  FEEDINGS  The length of each feeding varies from baby to baby and from feeding to feeding.   The baby must suck about 2 to 3 minutes for your milk to get to him or her. This is called a "let down." For this reason, allow the baby to feed on each breast as long as he or she wants. Your baby will end the feeding when he or she has received the right balance of nutrients.   To break the suction, put your finger into the corner of the baby's mouth and slide it between his or her gums before removing your breast from his or her mouth. This will help prevent sore nipples.  REDUCING BREAST ENGORGEMENT  In the first week after your baby is born, you may experience signs of breast engorgement. When breasts are engorged, they feel heavy, warm, full, and may be tender to the touch. You can reduce engorgement if you:   Nurse frequently, every 2 to 3 hours. Mothers who breastfeed early and often have fewer problems with engorgement.   Place light ice packs on your breasts between feedings. This reduces swelling. Wrap the ice packs in a   lightweight towel to protect your skin.   Apply moist hot packs to your breast for 5 to 10 minutes before each feeding. This increases circulation and helps the milk flow.   Gently massage your breast before and during the feeding.   Make sure that the baby empties at least one breast at every feeding before switching sides.   Use a breast pump to empty the breasts if your baby is sleepy or not nursing well. You may also want to pump if you are returning to work or or you feel you are getting engorged.   Avoid bottle feeds, pacifiers or supplemental feedings of water or juice in place of breastfeeding.   Be sure the baby is latched on and positioned properly while  breastfeeding.   Prevent fatigue, stress, and anemia.   Wear a supportive bra, avoiding underwire styles.   Eat a balanced diet with enough fluids.  If you follow these suggestions, your engorgement should improve in 24 to 48 hours. If you are still experiencing difficulty, call your lactation consultant or caregiver. IS MY BABY GETTING ENOUGH MILK? Sometimes, mothers worry about whether their babies are getting enough milk. You can be assured that your baby is getting enough milk if:  The baby is actively sucking and you hear swallowing.   The baby nurses at least 8 to 12 times in a 24 hour time period. Nurse your baby until he or she unlatches or falls asleep at the first breast (at least 10 to 20 minutes), then offer the second side.   The baby is wetting 5 to 6 disposable diapers (6 to 8 cloth diapers) in a 24 hour period by 5 to 6 days of age.   The baby is having at least 2 to 3 stools every 24 hours for the first few months. Breast milk is all the food your baby needs. It is not necessary for your baby to have water or formula. In fact, to help your breasts make more milk, it is best not to give your baby supplemental feedings during the early weeks.   The stool should be soft and yellow.   The baby should gain 4 to 7 ounces per week after he is 4 days old.  TAKE CARE OF YOURSELF Take care of your breasts by:  Bathing or showering daily.   Avoiding the use of soaps on your nipples.   Start feedings on your left breast at one feeding and on your right breast at the next feeding.   You will notice an increase in your milk supply 2 to 5 days after delivery. You may feel some discomfort from engorgement, which makes your breasts very firm and often tender. Engorgement "peaks" out within 24 to 48 hours. In the meantime, apply warm moist towels to your breasts for 5 to 10 minutes before feeding. Gentle massage and expression of some milk before feeding will soften your breasts, making  it easier for your baby to latch on. Wear a well fitting nursing bra and air dry your nipples for 10 to 15 minutes after each feeding.   Only use cotton bra pads.   Only use pure lanolin on your nipples after nursing. You do not need to wash it off before nursing.  Take care of yourself by:   Eating well-balanced meals and nutritious snacks.   Drinking milk, fruit juice, and water to satisfy your thirst (about 8 glasses a day).   Getting plenty of rest.   Increasing calcium in   your diet (1200 mg a day).   Avoiding foods that you notice affect the baby in a bad way.  SEEK MEDICAL CARE IF:   You have any questions or difficulty with breastfeeding.   You need help.   You have a hard, red, sore area on your breast, accompanied by a fever of 100.5 F (38.1 C) or more.   Your baby is too sleepy to eat well or is having trouble sleeping.   Your baby is wetting less than 6 diapers per day, by 5 days of age.   Your baby's skin or white part of his or her eyes is more yellow than it was in the hospital.   You feel depressed.  Document Released: 03/06/2005 Document Revised: 02/23/2011 Document Reviewed: 10/19/2008 [employer] Patient Information 2012 [employer]. 

## 2011-07-13 NOTE — Progress Notes (Signed)
Doing well--28 wk labs, TdaP and Rhogam today

## 2011-07-27 ENCOUNTER — Encounter: Payer: Self-pay | Admitting: Obstetrics and Gynecology

## 2011-07-27 ENCOUNTER — Ambulatory Visit (INDEPENDENT_AMBULATORY_CARE_PROVIDER_SITE_OTHER): Payer: Medicaid Other | Admitting: Obstetrics and Gynecology

## 2011-07-27 VITALS — BP 119/61 | Wt 169.0 lb

## 2011-07-27 DIAGNOSIS — Z3480 Encounter for supervision of other normal pregnancy, unspecified trimester: Secondary | ICD-10-CM

## 2011-07-27 NOTE — Progress Notes (Signed)
Patient doing well without complaints. FM/PTL precautions reviewed. Patient interested in water birth. Information provided

## 2011-07-27 NOTE — Progress Notes (Signed)
Patient is here for routine ob check.  She is doing well.

## 2011-08-10 ENCOUNTER — Ambulatory Visit (INDEPENDENT_AMBULATORY_CARE_PROVIDER_SITE_OTHER): Payer: Medicaid Other | Admitting: Obstetrics & Gynecology

## 2011-08-10 VITALS — BP 111/63 | Wt 171.0 lb

## 2011-08-10 DIAGNOSIS — Z3480 Encounter for supervision of other normal pregnancy, unspecified trimester: Secondary | ICD-10-CM

## 2011-08-10 DIAGNOSIS — O26899 Other specified pregnancy related conditions, unspecified trimester: Secondary | ICD-10-CM

## 2011-08-10 DIAGNOSIS — O360990 Maternal care for other rhesus isoimmunization, unspecified trimester, not applicable or unspecified: Secondary | ICD-10-CM

## 2011-08-10 NOTE — Progress Notes (Signed)
No other complaints or concerns.  Fetal movement and labor precautions reviewed.  

## 2011-08-10 NOTE — Patient Instructions (Signed)
Return to clinic for any obstetric concerns or go to [facility] for evaluation  

## 2011-08-29 ENCOUNTER — Encounter: Payer: Self-pay | Admitting: Obstetrics & Gynecology

## 2011-08-29 ENCOUNTER — Ambulatory Visit (INDEPENDENT_AMBULATORY_CARE_PROVIDER_SITE_OTHER): Payer: Medicaid Other | Admitting: Obstetrics & Gynecology

## 2011-08-29 VITALS — BP 117/72 | Wt 174.0 lb

## 2011-08-29 DIAGNOSIS — Z6791 Unspecified blood type, Rh negative: Secondary | ICD-10-CM

## 2011-08-29 DIAGNOSIS — Z3480 Encounter for supervision of other normal pregnancy, unspecified trimester: Secondary | ICD-10-CM

## 2011-08-29 DIAGNOSIS — O360990 Maternal care for other rhesus isoimmunization, unspecified trimester, not applicable or unspecified: Secondary | ICD-10-CM

## 2011-08-29 NOTE — Progress Notes (Signed)
SSE: white discharge, no blood seen. No lesions, normal cervix.  Digital: closed/thick/long.  Patient reassured.  No other complaints or concerns.  Fetal movement and labor precautions reviewed.  Cultures next visit.

## 2011-08-29 NOTE — Patient Instructions (Signed)
Return to clinic for any obstetric concerns or go to [facility] for evaluation  

## 2011-08-29 NOTE — Progress Notes (Signed)
Patient is doing well.  She did have some brown discharge yesterday, but it is clear now.

## 2011-09-11 ENCOUNTER — Ambulatory Visit (INDEPENDENT_AMBULATORY_CARE_PROVIDER_SITE_OTHER): Payer: Medicaid Other | Admitting: Family Medicine

## 2011-09-11 ENCOUNTER — Encounter: Payer: Self-pay | Admitting: Family Medicine

## 2011-09-11 VITALS — BP 116/71 | Wt 174.0 lb

## 2011-09-11 DIAGNOSIS — Z3480 Encounter for supervision of other normal pregnancy, unspecified trimester: Secondary | ICD-10-CM

## 2011-09-11 DIAGNOSIS — O322XX0 Maternal care for transverse and oblique lie, not applicable or unspecified: Secondary | ICD-10-CM

## 2011-09-11 LAB — OB RESULTS CONSOLE GBS: GBS: NEGATIVE

## 2011-09-11 NOTE — Progress Notes (Signed)
Doing well--will check presentation-appears transverse, head to maternal left--discussed ECV.  Will recheck at her next visit.

## 2011-09-11 NOTE — Progress Notes (Signed)
Patient is here for routine check, she still feels that the baby is sideways and wants to talk about this.

## 2011-09-11 NOTE — Patient Instructions (Addendum)
External Cephalic Version External cephalic version is turning a baby that is presenting their buttocks first (breech) or is lying sideways in the uterus (transverse) to a head-first position. This makes the labor and delivery faster, safer for the mother and baby, and lessens the chance for a Cesarean section. It should not be tried until the pregnancy is [redacted] weeks along or longer. BEFORE THE PROCEDURE   Do not take aspirin.   Do not eat for 4 hours before the procedure.   Tell your caregiver if you have a cold, fever or an infection.   Tell your caregiver if you are having contractions.   Tell your caregiver if you are leaking or had a gush of fluid from your vagina.   Tell your caregiver if you have any vaginal bleeding or abnormal discharge.   If you are being admitted the same day, arrive at the hospital at least one hour before the procedure to sign any necessary documents and to get prepared for the procedure.   Tell your caregiver if you had any problems with anesthetics in the past.   Tell your caregiver if you are taking any medications that your caregiver does not know about. This includes over-the-counter and prescription drugs, herbs, eye drops and creams.  PROCEDURE  First, an ultrasound is done to make sure the baby is breech or transverse.   A non-stress test or biophysical profile is done on the baby before the ECV. This is done to make sure it is safe for the baby to have the ECV. It may also be done after the procedure to make sure the baby is OK.   ECV is done in the delivery/surgical room with an anesthesiologist present. There should be a setup for an emergency Cesarean section with a full nursing and nursery staff available and ready.   The patient may be given a medication to relax the uterine muscles. An epidural may be given for any discomfort. It is helpful for the success of the ECV.   An electronic fetal monitor is placed on the uterus during the procedure  to make sure the baby is OK.   If the mother is Rh negative, Rho-gam will be given to her to prevent Rh problems for future pregnancies.   The mother is followed closely for 2 to 3 hours after the procedure to make sure no problems develop.  BENEFITS OF ECV  Easier and safer labor and delivery for the mother and baby.   Lower incidence of Cesarean section.   Lower costs with a vaginal delivery.  RISKS OF ECV  The placenta pulls away from the wall of the uterus before delivery (abruption of the placenta).   Rupture of the uterus, especially in patients with a previous Cesarean section.   Fetal distress.   Early (premature) labor.   Premature rupture of the membranes.   The baby will return to the breech or transverse lie position.   Death of the fetus can happen, but is very rare.  ECV SHOULD BE STOPPED IF:  The fetal heart tones drop.   The mother is having a lot of pain.   You cannot turn the baby after several attempts.  ECV SHOULD NOT BE DONE IF:  The non-stress test or biophysical profile is abnormal.   There is vaginal bleeding.   An abnormal shaped uterus is present.   There is heart disease or uncontrolled high blood pressure in the mother.   There are twins or more.  The placenta covers the opening of the cervix (placenta previa).   You had a previous cesarean section with a classical incision or major surgery of the uterus.   There is not enough amniotic fluid in the sac (oligohydramnios).   The baby is too small for the pregnancy or has not developed normally (anomaly).   Your membranes have ruptured.  HOME CARE INSTRUCTIONS   Have someone take you home after the procedure.   Rest at home for several hours.   Have someone stay with you for a few hours after you get home.   After ECV, continue with your prenatal visits as directed.   Continue your regular diet, rest and activities.   Do not do any strenuous activities for a couple of days.    SEEK IMMEDIATE MEDICAL CARE IF:   You develop vaginal bleeding.   You have fluid coming out of your vagina (bag of water may have broken).   You develop uterine contractions.   You do not feel the baby move or there is less movement of the baby.   You develop abdominal pain.   You develop an oral temperature of 102 F (38.9 C) or higher.  Document Released: 08/29/2006 Document Revised: 02/23/2011 Document Reviewed: 06/24/2008 [facility] Patient Information 2012 [place], [place]. Breastfeeding BENEFITS OF BREASTFEEDING For the baby  The first milk (colostrum) helps the baby's digestive system function better.   There are antibodies from the mother in the milk that help the baby fight off infections.   The baby has a lower incidence of asthma, allergies, and SIDS (sudden infant death syndrome).   The nutrients in breast milk are better than formulas for the baby and helps the baby's brain grow better.   Babies who breastfeed have less gas, colic, and constipation.  For the mother  Breastfeeding helps develop a very special bond between mother and baby.   It is more convenient, always available at the correct temperature and cheaper than formula feeding.   It burns calories in the mother and helps with losing weight that was gained during pregnancy.   It makes the uterus contract back down to normal size faster and slows bleeding following delivery.   Breastfeeding mothers have a lower risk of developing breast cancer.  NURSE FREQUENTLY  A healthy, full-term baby may breastfeed as often as every hour or space his or her feedings to every 3 hours.   How often to nurse will vary from baby to baby. Watch your baby for signs of hunger, not the clock.   Nurse as often as the baby requests, or when you feel the need to reduce the fullness of your breasts.   Awaken the baby if it has been 3 to 4 hours since the last feeding.   Frequent feeding will help the mother make more  milk and will prevent problems like sore nipples and engorgement of the breasts.  BABY'S POSITION AT THE BREAST  Whether lying down or sitting, be sure that the baby's tummy is facing your tummy.   Support the breast with 4 fingers underneath the breast and the thumb above. Make sure your fingers are well away from the nipple and baby's mouth.   Stroke the baby's lips and cheek closest to the breast gently with your finger or nipple.   When the baby's mouth is open wide enough, place all of your nipple and as much of the dark area around the nipple as possible into your baby's mouth.   Pull  the baby in close so the tip of the nose and the baby's cheeks touch the breast during the feeding.  FEEDINGS  The length of each feeding varies from baby to baby and from feeding to feeding.   The baby must suck about 2 to 3 minutes for your milk to get to him or her. This is called a "let down." For this reason, allow the baby to feed on each breast as long as he or she wants. Your baby will end the feeding when he or she has received the right balance of nutrients.   To break the suction, put your finger into the corner of the baby's mouth and slide it between his or her gums before removing your breast from his or her mouth. This will help prevent sore nipples.  REDUCING BREAST ENGORGEMENT  In the first week after your baby is born, you may experience signs of breast engorgement. When breasts are engorged, they feel heavy, warm, full, and may be tender to the touch. You can reduce engorgement if you:   Nurse frequently, every 2 to 3 hours. Mothers who breastfeed early and often have fewer problems with engorgement.   Place light ice packs on your breasts between feedings. This reduces swelling. Wrap the ice packs in a lightweight towel to protect your skin.   Apply moist hot packs to your breast for 5 to 10 minutes before each feeding. This increases circulation and helps the milk flow.   Gently  massage your breast before and during the feeding.   Make sure that the baby empties at least one breast at every feeding before switching sides.   Use a breast pump to empty the breasts if your baby is sleepy or not nursing well. You may also want to pump if you are returning to work or or you feel you are getting engorged.   Avoid bottle feeds, pacifiers or supplemental feedings of water or juice in place of breastfeeding.   Be sure the baby is latched on and positioned properly while breastfeeding.   Prevent fatigue, stress, and anemia.   Wear a supportive bra, avoiding underwire styles.   Eat a balanced diet with enough fluids.  If you follow these suggestions, your engorgement should improve in 24 to 48 hours. If you are still experiencing difficulty, call your lactation consultant or caregiver. IS MY BABY GETTING ENOUGH MILK? Sometimes, mothers worry about whether their babies are getting enough milk. You can be assured that your baby is getting enough milk if:  The baby is actively sucking and you hear swallowing.   The baby nurses at least 8 to 12 times in a 24 hour time period. Nurse your baby until he or she unlatches or falls asleep at the first breast (at least 10 to 20 minutes), then offer the second side.   The baby is wetting 5 to 6 disposable diapers (6 to 8 cloth diapers) in a 24 hour period by 44 to 80 days of age.   The baby is having at least 2 to 3 stools every 24 hours for the first few months. Breast milk is all the food your baby needs. It is not necessary for your baby to have water or formula. In fact, to help your breasts make more milk, it is best not to give your baby supplemental feedings during the early weeks.   The stool should be soft and yellow.   The baby should gain 4 to 7 ounces per week after  he is 23 days old.  TAKE CARE OF YOURSELF Take care of your breasts by:  Bathing or showering daily.   Avoiding the use of soaps on your nipples.   Start  feedings on your left breast at one feeding and on your right breast at the next feeding.   You will notice an increase in your milk supply 2 to 5 days after delivery. You may feel some discomfort from engorgement, which makes your breasts very firm and often tender. Engorgement "peaks" out within 24 to 48 hours. In the meantime, apply warm moist towels to your breasts for 5 to 10 minutes before feeding. Gentle massage and expression of some milk before feeding will soften your breasts, making it easier for your baby to latch on. Wear a well fitting nursing bra and air dry your nipples for 10 to 15 minutes after each feeding.   Only use cotton bra pads.   Only use pure lanolin on your nipples after nursing. You do not need to wash it off before nursing.  Take care of yourself by:   Eating well-balanced meals and nutritious snacks.   Drinking milk, fruit juice, and water to satisfy your thirst (about 8 glasses a day).   Getting plenty of rest.   Increasing calcium in your diet (1200 mg a day).   Avoiding foods that you notice affect the baby in a bad way.  SEEK MEDICAL CARE IF:   You have any questions or difficulty with breastfeeding.   You need help.   You have a hard, red, sore area on your breast, accompanied by a fever of 100.5 F (38.1 C) or more.   Your baby is too sleepy to eat well or is having trouble sleeping.   Your baby is wetting less than 6 diapers per day, by 66 days of age.   Your baby's skin or white part of his or her eyes is more yellow than it was in the hospital.   You feel depressed.  Document Released: 03/06/2005 Document Revised: 02/23/2011 Document Reviewed: 10/19/2008 [facility] Patient Information 2012 [place], [place].

## 2011-09-12 LAB — GC/CHLAMYDIA PROBE AMP, GENITAL: GC Probe Amp, Genital: NEGATIVE

## 2011-09-19 ENCOUNTER — Ambulatory Visit (INDEPENDENT_AMBULATORY_CARE_PROVIDER_SITE_OTHER): Payer: Medicaid Other | Admitting: Family Medicine

## 2011-09-19 ENCOUNTER — Telehealth (HOSPITAL_COMMUNITY): Payer: Self-pay | Admitting: *Deleted

## 2011-09-19 VITALS — BP 127/75 | Wt 178.5 lb

## 2011-09-19 DIAGNOSIS — Z3480 Encounter for supervision of other normal pregnancy, unspecified trimester: Secondary | ICD-10-CM

## 2011-09-19 NOTE — Progress Notes (Signed)
For ECV on Monday at 37 1/7 wks at 8 am Risks of ECV discussed with pt. GBS neg

## 2011-09-19 NOTE — Patient Instructions (Signed)
Breastfeeding BENEFITS OF BREASTFEEDING For the baby  The first milk (colostrum) helps the baby's digestive system function better.   There are antibodies from the mother in the milk that help the baby fight off infections.   The baby has a lower incidence of asthma, allergies, and SIDS (sudden infant death syndrome).   The nutrients in breast milk are better than formulas for the baby and helps the baby's brain grow better.   Babies who breastfeed have less gas, colic, and constipation.  For the mother  Breastfeeding helps develop a very special bond between mother and baby.   It is more convenient, always available at the correct temperature and cheaper than formula feeding.   It burns calories in the mother and helps with losing weight that was gained during pregnancy.   It makes the uterus contract back down to normal size faster and slows bleeding following delivery.   Breastfeeding mothers have a lower risk of developing breast cancer.  NURSE FREQUENTLY  A healthy, full-term baby may breastfeed as often as every hour or space his or her feedings to every 3 hours.   How often to nurse will vary from baby to baby. Watch your baby for signs of hunger, not the clock.   Nurse as often as the baby requests, or when you feel the need to reduce the fullness of your breasts.   Awaken the baby if it has been 3 to 4 hours since the last feeding.   Frequent feeding will help the mother make more milk and will prevent problems like sore nipples and engorgement of the breasts.  BABY'S POSITION AT THE BREAST  Whether lying down or sitting, be sure that the baby's tummy is facing your tummy.   Support the breast with 4 fingers underneath the breast and the thumb above. Make sure your fingers are well away from the nipple and baby's mouth.   Stroke the baby's lips and cheek closest to the breast gently with your finger or nipple.   When the baby's mouth is open wide enough, place all  of your nipple and as much of the dark area around the nipple as possible into your baby's mouth.   Pull the baby in close so the tip of the nose and the baby's cheeks touch the breast during the feeding.  FEEDINGS  The length of each feeding varies from baby to baby and from feeding to feeding.   The baby must suck about 2 to 3 minutes for your milk to get to him or her. This is called a "let down." For this reason, allow the baby to feed on each breast as long as he or she wants. Your baby will end the feeding when he or she has received the right balance of nutrients.   To break the suction, put your finger into the corner of the baby's mouth and slide it between his or her gums before removing your breast from his or her mouth. This will help prevent sore nipples.  REDUCING BREAST ENGORGEMENT  In the first week after your baby is born, you may experience signs of breast engorgement. When breasts are engorged, they feel heavy, warm, full, and may be tender to the touch. You can reduce engorgement if you:   Nurse frequently, every 2 to 3 hours. Mothers who breastfeed early and often have fewer problems with engorgement.   Place light ice packs on your breasts between feedings. This reduces swelling. Wrap the ice packs in a   lightweight towel to protect your skin.   Apply moist hot packs to your breast for 5 to 10 minutes before each feeding. This increases circulation and helps the milk flow.   Gently massage your breast before and during the feeding.   Make sure that the baby empties at least one breast at every feeding before switching sides.   Use a breast pump to empty the breasts if your baby is sleepy or not nursing well. You may also want to pump if you are returning to work or or you feel you are getting engorged.   Avoid bottle feeds, pacifiers or supplemental feedings of water or juice in place of breastfeeding.   Be sure the baby is latched on and positioned properly while  breastfeeding.   Prevent fatigue, stress, and anemia.   Wear a supportive bra, avoiding underwire styles.   Eat a balanced diet with enough fluids.  If you follow these suggestions, your engorgement should improve in 24 to 48 hours. If you are still experiencing difficulty, call your lactation consultant or caregiver. IS MY BABY GETTING ENOUGH MILK? Sometimes, mothers worry about whether their babies are getting enough milk. You can be assured that your baby is getting enough milk if:  The baby is actively sucking and you hear swallowing.   The baby nurses at least 8 to 12 times in a 24 hour time period. Nurse your baby until he or she unlatches or falls asleep at the first breast (at least 10 to 20 minutes), then offer the second side.   The baby is wetting 5 to 6 disposable diapers (6 to 8 cloth diapers) in a 24 hour period by 5 to 6 days of age.   The baby is having at least 2 to 3 stools every 24 hours for the first few months. Breast milk is all the food your baby needs. It is not necessary for your baby to have water or formula. In fact, to help your breasts make more milk, it is best not to give your baby supplemental feedings during the early weeks.   The stool should be soft and yellow.   The baby should gain 4 to 7 ounces per week after he is 4 days old.  TAKE CARE OF YOURSELF Take care of your breasts by:  Bathing or showering daily.   Avoiding the use of soaps on your nipples.   Start feedings on your left breast at one feeding and on your right breast at the next feeding.   You will notice an increase in your milk supply 2 to 5 days after delivery. You may feel some discomfort from engorgement, which makes your breasts very firm and often tender. Engorgement "peaks" out within 24 to 48 hours. In the meantime, apply warm moist towels to your breasts for 5 to 10 minutes before feeding. Gentle massage and expression of some milk before feeding will soften your breasts, making  it easier for your baby to latch on. Wear a well fitting nursing bra and air dry your nipples for 10 to 15 minutes after each feeding.   Only use cotton bra pads.   Only use pure lanolin on your nipples after nursing. You do not need to wash it off before nursing.  Take care of yourself by:   Eating well-balanced meals and nutritious snacks.   Drinking milk, fruit juice, and water to satisfy your thirst (about 8 glasses a day).   Getting plenty of rest.   Increasing calcium in   your diet (1200 mg a day).   Avoiding foods that you notice affect the baby in a bad way.  SEEK MEDICAL CARE IF:   You have any questions or difficulty with breastfeeding.   You need help.   You have a hard, red, sore area on your breast, accompanied by a fever of 100.5 F (38.1 C) or more.   Your baby is too sleepy to eat well or is having trouble sleeping.   Your baby is wetting less than 6 diapers per day, by 5 days of age.   Your baby's skin or white part of his or her eyes is more yellow than it was in the hospital.   You feel depressed.  Document Released: 03/06/2005 Document Revised: 02/23/2011 Document Reviewed: 10/19/2008 [employer] Patient Information 2012 [employer]. 

## 2011-09-19 NOTE — Progress Notes (Signed)
Increased swelling in feet.

## 2011-09-19 NOTE — Telephone Encounter (Signed)
Preadmission screen  

## 2011-09-25 ENCOUNTER — Observation Stay (HOSPITAL_COMMUNITY)
Admission: RE | Admit: 2011-09-25 | Discharge: 2011-09-25 | Disposition: A | Discharge status: Home / Self Care | Payer: Medicaid Other | Source: Ambulatory Visit | Attending: Family Medicine | Admitting: Family Medicine

## 2011-09-25 ENCOUNTER — Encounter (HOSPITAL_COMMUNITY): Payer: Self-pay

## 2011-09-25 VITALS — BP 121/68 | HR 76 | Temp 97.8°F | Resp 20 | Ht 67.0 in | Wt 178.0 lb

## 2011-09-25 DIAGNOSIS — O322XX0 Maternal care for transverse and oblique lie, not applicable or unspecified: Secondary | ICD-10-CM

## 2011-09-25 DIAGNOSIS — O321XX0 Maternal care for breech presentation, not applicable or unspecified: Principal | ICD-10-CM | POA: Insufficient documentation

## 2011-09-25 MED ORDER — TERBUTALINE SULFATE 1 MG/ML IJ SOLN
0.2500 mg | Freq: Once | INTRAMUSCULAR | Status: AC
  Administered 2011-09-25: 0.25 mg via SUBCUTANEOUS

## 2011-09-25 MED ORDER — LACTATED RINGERS IV SOLN
INTRAVENOUS | Status: DC
  Administered 2011-09-25: 09:00:00 via INTRAVENOUS

## 2011-09-25 MED ORDER — TERBUTALINE SULFATE 1 MG/ML IJ SOLN
INTRAMUSCULAR | Status: AC
  Administered 2011-09-25: 0.25 mg via SUBCUTANEOUS
  Filled 2011-09-25: qty 1

## 2011-09-25 NOTE — Progress Notes (Signed)
Patient ID: [name], female   DOB: [birth year], 26 y.o.   MRN: [id number] After informed verbal consent, Terbutaline 0.25 mg SQ given, ECV was attempted under Ultrasound guidance.  Infant turned via forward roll to vertex.     FHR was reactive before and after the procedure.   Pt. Tolerated the procedure well.

## 2011-09-25 NOTE — H&P (Signed)
  [name] is an 26 y.o. G71P1001 [redacted]w[redacted]d female.   Chief Complaint: breech presentation HPI: 26 y.o., G2P1001 @ [redacted]w[redacted]d with breech presentation who desires version.    Past Medical History  Diagnosis Date  . Rh negative state in antepartum period   . Abnormal Pap smear     Past Surgical History  Procedure Date  . Tonsillectomy     26 yrs old    Family History  Problem Relation Age of Onset  . Diabetes Maternal Grandmother   . Diabetes Maternal Grandfather   . Anesthesia problems Neg Hx   . Hypotension Neg Hx   . Malignant hyperthermia Neg Hx   . Pseudochol deficiency Neg Hx    Social History:  reports that she has never smoked. She has never used smokeless tobacco. She reports that she does not drink alcohol or use illicit drugs.  Allergies: No Known Allergies  Medications Prior to Admission  Medication Sig Dispense Refill  . Prenatal Vit-Fe Fumarate-FA (PRENATAL MULTIVITAMIN) TABS Take 1 tablet by mouth every morning.      . zolpidem (AMBIEN) 5 MG tablet Take 5 mg by mouth at bedtime as needed. For sleep         A comprehensive review of systems was negative.  Blood pressure 122/77, pulse 67, temperature 97.8 F (36.6 C), temperature source Oral, resp. rate 20, height 5\' 7"  (1.702 m), weight 80.74 kg (178 lb), last menstrual period 01/07/2011. General appearance: alert, cooperative and appears stated age Head: Normocephalic, without obvious abnormality, atraumatic Neck: supple, symmetrical, trachea midline and thyroid not enlarged, symmetric, no tenderness/mass/nodules Lungs: clear to auscultation bilaterally Heart: regular rate and rhythm, S1, S2 normal, no murmur, click, rub or gallop Abdomen: soft, non-tender; bowel sounds normal; no masses,  no organomegaly and gravid Extremities: extremities normal, atraumatic, no cyanosis or edema Pulses: 2+ and symmetric Skin: Skin color, texture, turgor normal. No rashes or lesions Neurologic: Grossly normal   Lab  Results  Component Value Date   WBC 11.2* 07/13/2011   HGB 10.2* 07/13/2011   HCT 31.6* 07/13/2011   MCV 92.9 07/13/2011   PLT 233 07/13/2011   Lab Results  Component Value Date   PREGTESTUR Positive 02/06/2011     Assessment/Plan Patient Active Problem List  Diagnosis  . Supervision of other normal pregnancy  . Rh negative state in antepartum period  . Migraine headache without aura  . Anxiety  . Transverse fetal lie, antepartum   For ECV.  Jina Olenick S 09/25/2011, 9:18 AM

## 2011-09-26 ENCOUNTER — Ambulatory Visit (INDEPENDENT_AMBULATORY_CARE_PROVIDER_SITE_OTHER): Payer: Medicaid Other | Admitting: Family Medicine

## 2011-09-26 DIAGNOSIS — Z3480 Encounter for supervision of other normal pregnancy, unspecified trimester: Secondary | ICD-10-CM

## 2011-09-26 NOTE — Patient Instructions (Addendum)
Normal Labor and Delivery Your caregiver must first be sure you are in labor. Signs of labor include:  You may pass what is called "the mucus plug" before labor begins. This is a small amount of blood stained mucus.   Regular uterine contractions.   The time between contractions get closer together.   The discomfort and pain gradually gets more intense.   Pains are mostly located in the back.   Pains get worse when walking.   The cervix (the opening of the uterus becomes thinner (begins to efface) and opens up (dilates).  Once you are in labor and admitted into the hospital or care center, your caregiver will do the following:  A complete physical examination.   Check your vital signs (blood pressure, pulse, temperature and the fetal heart rate).   Do a vaginal examination (using a sterile glove and lubricant) to determine:   The position (presentation) of the baby (head [vertex] or buttock first).   The level (station) of the baby's head in the birth canal.   The effacement and dilatation of the cervix.   You may have your pubic hair shaved and be given an enema depending on your caregiver and the circumstance.   An electronic monitor is usually placed on your abdomen. The monitor follows the length and intensity of the contractions, as well as the baby's heart rate.   Usually, your caregiver will insert an IV in your arm with a bottle of sugar water. This is done as a precaution so that medications can be given to you quickly during labor or delivery.  NORMAL LABOR AND DELIVERY IS DIVIDED UP INTO 3 STAGES: First Stage This is when regular contractions begin and the cervix begins to efface and dilate. This stage can last from 3 to 15 hours. The end of the first stage is when the cervix is 100% effaced and 10 centimeters dilated. Pain medications may be given by   Injection (morphine, demerol, etc.)   Regional anesthesia (spinal, caudal or epidural, anesthetics given in  different locations of the spine). Paracervical pain medication may be given, which is an injection of and anesthetic on each side of the cervix.  A pregnant woman may request to have "Natural Childbirth" which is not to have any medications or anesthesia during her labor and delivery. Second Stage This is when the baby comes down through the birth canal (vagina) and is born. This can take 1 to 4 hours. As the baby's head comes down through the birth canal, you may feel like you are going to have a bowel movement. You will get the urge to bear down and push until the baby is delivered. As the baby's head is being delivered, the caregiver will decide if an episiotomy (a cut in the perineum and vagina area) is needed to prevent tearing of the tissue in this area. The episiotomy is sewn up after the delivery of the baby and placenta. Sometimes a mask with nitrous oxide is given for the mother to breath during the delivery of the baby to help if there is too much pain. The end of Stage 2 is when the baby is fully delivered. Then when the umbilical cord stops pulsating it is clamped and cut. Third Stage The third stage begins after the baby is completely delivered and ends after the placenta (afterbirth) is delivered. This usually takes 5 to 30 minutes. After the placenta is delivered, a medication is given either by intravenous or injection to help contract   the uterus and prevent bleeding. The third stage is not painful and pain medication is usually not necessary. If an episiotomy was done, it is repaired at this time. After the delivery, the mother is watched and monitored closely for 1 to 2 hours to make sure there is no postpartum bleeding (hemorrhage). If there is a lot of bleeding, medication is given to contract the uterus and stop the bleeding. Document Released: 12/14/2007 Document Revised: 02/23/2011 Document Reviewed: 12/14/2007 [facility] Patient Information 2012 [place], [place]. Contraception  Choices Contraception (birth control) is the use of any methods or devices to prevent pregnancy. Below are some methods to help avoid pregnancy. HORMONAL METHODS   Contraceptive implant. This is a thin, plastic tube containing progesterone hormone. It does not contain estrogen hormone. Your caregiver inserts the tube in the inner part of the upper arm. The tube can remain in place for up to 3 years. After 3 years, the implant must be removed. The implant prevents the ovaries from releasing an egg (ovulation), thickens the cervical mucus which prevents sperm from entering the uterus, and thins the lining of the inside of the uterus.   Progesterone-only injections. These injections are given every 3 months by your caregiver to prevent pregnancy. This synthetic progesterone hormone stops the ovaries from releasing eggs. It also thickens cervical mucus and changes the uterine lining. This makes it harder for sperm to survive in the uterus.   Birth control pills. These pills contain estrogen and progesterone hormone. They work by stopping the egg from forming in the ovary (ovulation). Birth control pills are prescribed by a caregiver.Birth control pills can also be used to treat heavy periods.   Minipill. This type of birth control pill contains only the progesterone hormone. They are taken every day of each month and must be prescribed by your caregiver.   Birth control patch. The patch contains hormones similar to those in birth control pills. It must be changed once a week and is prescribed by a caregiver.   Vaginal ring. The ring contains hormones similar to those in birth control pills. It is left in the vagina for 3 weeks, removed for 1 week, and then a new one is put back in place. The patient must be comfortable inserting and removing the ring from the vagina.A caregiver's prescription is necessary.   Emergency contraception. Emergency contraceptives prevent pregnancy after unprotected sexual  intercourse. This pill can be taken right after sex or up to 5 days after unprotected sex. It is most effective the sooner you take the pills after having sexual intercourse. Emergency contraceptive pills are available without a prescription. Check with your pharmacist. Do not use emergency contraception as your only form of birth control.  BARRIER METHODS   Female condom. This is a thin sheath (latex or rubber) that is worn over the penis during sexual intercourse. It can be used with spermicide to increase effectiveness.   Female condom. This is a soft, loose-fitting sheath that is put into the vagina before sexual intercourse.   Diaphragm. This is a soft, latex, dome-shaped barrier that must be fitted by a caregiver. It is inserted into the vagina, along with a spermicidal jelly. It is inserted before intercourse. The diaphragm should be left in the vagina for 6 to 8 hours after intercourse.   Cervical cap. This is a round, soft, latex or plastic cup that fits over the cervix and must be fitted by a caregiver. The cap can be left in place for up to  48 hours after intercourse.   Sponge. This is a soft, circular piece of polyurethane foam. The sponge has spermicide in it. It is inserted into the vagina after wetting it and before sexual intercourse.   Spermicides. These are chemicals that kill or block sperm from entering the cervix and uterus. They come in the form of creams, jellies, suppositories, foam, or tablets. They do not require a prescription. They are inserted into the vagina with an applicator before having sexual intercourse. The process must be repeated every time you have sexual intercourse.  INTRAUTERINE CONTRACEPTION  Intrauterine device (IUD). This is a T-shaped device that is put in a woman's uterus during a menstrual period to prevent pregnancy. There are 2 types:   Copper IUD. This type of IUD is wrapped in copper wire and is placed inside the uterus. Copper makes the uterus and  fallopian tubes produce a fluid that kills sperm. It can stay in place for 10 years.   Hormone IUD. This type of IUD contains the hormone progestin (synthetic progesterone). The hormone thickens the cervical mucus and prevents sperm from entering the uterus, and it also thins the uterine lining to prevent implantation of a fertilized egg. The hormone can weaken or kill the sperm that get into the uterus. It can stay in place for 5 years.  PERMANENT METHODS OF CONTRACEPTION  Female tubal ligation. This is when the woman's fallopian tubes are surgically sealed, tied, or blocked to prevent the egg from traveling to the uterus.   Female sterilization. This is when the female has the tubes that carry sperm tied off (vasectomy).This blocks sperm from entering the vagina during sexual intercourse. After the procedure, the man can still ejaculate fluid (semen).  NATURAL PLANNING METHODS  Natural family planning. This is not having sexual intercourse or using a barrier method (condom, diaphragm, cervical cap) on days the woman could become pregnant.   Calendar method. This is keeping track of the length of each menstrual cycle and identifying when you are fertile.   Ovulation method. This is avoiding sexual intercourse during ovulation.   Symptothermal method. This is avoiding sexual intercourse during ovulation, using a thermometer and ovulation symptoms.   Post-ovulation method. This is timing sexual intercourse after you have ovulated.  Regardless of which type or method of contraception you choose, it is important that you use condoms to protect against the transmission of sexually transmitted diseases (STDs). Talk with your caregiver about which form of contraception is most appropriate for you. Document Released: 03/06/2005 Document Revised: 02/23/2011 Document Reviewed: 07/13/2010 [facility] Patient Information 2012 [place], [place]. Breastfeeding BENEFITS OF BREASTFEEDING For the baby  The first  milk (colostrum) helps the baby's digestive system function better.   There are antibodies from the mother in the milk that help the baby fight off infections.   The baby has a lower incidence of asthma, allergies, and SIDS (sudden infant death syndrome).   The nutrients in breast milk are better than formulas for the baby and helps the baby's brain grow better.   Babies who breastfeed have less gas, colic, and constipation.  For the mother  Breastfeeding helps develop a very special bond between mother and baby.   It is more convenient, always available at the correct temperature and cheaper than formula feeding.   It burns calories in the mother and helps with losing weight that was gained during pregnancy.   It makes the uterus contract back down to normal size faster and slows bleeding following delivery.  Breastfeeding mothers have a lower risk of developing breast cancer.  NURSE FREQUENTLY  A healthy, full-term baby may breastfeed as often as every hour or space his or her feedings to every 3 hours.   How often to nurse will vary from baby to baby. Watch your baby for signs of hunger, not the clock.   Nurse as often as the baby requests, or when you feel the need to reduce the fullness of your breasts.   Awaken the baby if it has been 3 to 4 hours since the last feeding.   Frequent feeding will help the mother make more milk and will prevent problems like sore nipples and engorgement of the breasts.  BABY'S POSITION AT THE BREAST  Whether lying down or sitting, be sure that the baby's tummy is facing your tummy.   Support the breast with 4 fingers underneath the breast and the thumb above. Make sure your fingers are well away from the nipple and baby's mouth.   Stroke the baby's lips and cheek closest to the breast gently with your finger or nipple.   When the baby's mouth is open wide enough, place all of your nipple and as much of the dark area around the nipple as  possible into your baby's mouth.   Pull the baby in close so the tip of the nose and the baby's cheeks touch the breast during the feeding.  FEEDINGS  The length of each feeding varies from baby to baby and from feeding to feeding.   The baby must suck about 2 to 3 minutes for your milk to get to him or her. This is called a "let down." For this reason, allow the baby to feed on each breast as long as he or she wants. Your baby will end the feeding when he or she has received the right balance of nutrients.   To break the suction, put your finger into the corner of the baby's mouth and slide it between his or her gums before removing your breast from his or her mouth. This will help prevent sore nipples.  REDUCING BREAST ENGORGEMENT  In the first week after your baby is born, you may experience signs of breast engorgement. When breasts are engorged, they feel heavy, warm, full, and may be tender to the touch. You can reduce engorgement if you:   Nurse frequently, every 2 to 3 hours. Mothers who breastfeed early and often have fewer problems with engorgement.   Place light ice packs on your breasts between feedings. This reduces swelling. Wrap the ice packs in a lightweight towel to protect your skin.   Apply moist hot packs to your breast for 5 to 10 minutes before each feeding. This increases circulation and helps the milk flow.   Gently massage your breast before and during the feeding.   Make sure that the baby empties at least one breast at every feeding before switching sides.   Use a breast pump to empty the breasts if your baby is sleepy or not nursing well. You may also want to pump if you are returning to work or or you feel you are getting engorged.   Avoid bottle feeds, pacifiers or supplemental feedings of water or juice in place of breastfeeding.   Be sure the baby is latched on and positioned properly while breastfeeding.   Prevent fatigue, stress, and anemia.   Wear a  supportive bra, avoiding underwire styles.   Eat a balanced diet with enough fluids.  If you follow these suggestions, your engorgement should improve in 24 to 48 hours. If you are still experiencing difficulty, call your lactation consultant or caregiver. IS MY BABY GETTING ENOUGH MILK? Sometimes, mothers worry about whether their babies are getting enough milk. You can be assured that your baby is getting enough milk if:  The baby is actively sucking and you hear swallowing.   The baby nurses at least 8 to 12 times in a 24 hour time period. Nurse your baby until he or she unlatches or falls asleep at the first breast (at least 10 to 20 minutes), then offer the second side.   The baby is wetting 5 to 6 disposable diapers (6 to 8 cloth diapers) in a 24 hour period by 34 to 62 days of age.   The baby is having at least 2 to 3 stools every 24 hours for the first few months. Breast milk is all the food your baby needs. It is not necessary for your baby to have water or formula. In fact, to help your breasts make more milk, it is best not to give your baby supplemental feedings during the early weeks.   The stool should be soft and yellow.   The baby should gain 4 to 7 ounces per week after he is 15 days old.  TAKE CARE OF YOURSELF Take care of your breasts by:  Bathing or showering daily.   Avoiding the use of soaps on your nipples.   Start feedings on your left breast at one feeding and on your right breast at the next feeding.   You will notice an increase in your milk supply 2 to 5 days after delivery. You may feel some discomfort from engorgement, which makes your breasts very firm and often tender. Engorgement "peaks" out within 24 to 48 hours. In the meantime, apply warm moist towels to your breasts for 5 to 10 minutes before feeding. Gentle massage and expression of some milk before feeding will soften your breasts, making it easier for your baby to latch on. Wear a well fitting nursing  bra and air dry your nipples for 10 to 15 minutes after each feeding.   Only use cotton bra pads.   Only use pure lanolin on your nipples after nursing. You do not need to wash it off before nursing.  Take care of yourself by:   Eating well-balanced meals and nutritious snacks.   Drinking milk, fruit juice, and water to satisfy your thirst (about 8 glasses a day).   Getting plenty of rest.   Increasing calcium in your diet (1200 mg a day).   Avoiding foods that you notice affect the baby in a bad way.  SEEK MEDICAL CARE IF:   You have any questions or difficulty with breastfeeding.   You need help.   You have a hard, red, sore area on your breast, accompanied by a fever of 100.5 F (38.1 C) or more.   Your baby is too sleepy to eat well or is having trouble sleeping.   Your baby is wetting less than 6 diapers per day, by 87 days of age.   Your baby's skin or white part of his or her eyes is more yellow than it was in the hospital.   You feel depressed.  Document Released: 03/06/2005 Document Revised: 02/23/2011 Document Reviewed: 10/19/2008 [facility] Patient Information 2012 [place], [place].

## 2011-09-26 NOTE — Progress Notes (Signed)
S/P ECV doing well---No signs of labor yet.

## 2011-10-04 ENCOUNTER — Ambulatory Visit (INDEPENDENT_AMBULATORY_CARE_PROVIDER_SITE_OTHER): Payer: Medicaid Other | Admitting: Obstetrics & Gynecology

## 2011-10-04 VITALS — BP 129/80 | Wt 180.0 lb

## 2011-10-04 DIAGNOSIS — O322XX0 Maternal care for transverse and oblique lie, not applicable or unspecified: Secondary | ICD-10-CM

## 2011-10-04 DIAGNOSIS — Z6791 Unspecified blood type, Rh negative: Secondary | ICD-10-CM

## 2011-10-04 DIAGNOSIS — Z3480 Encounter for supervision of other normal pregnancy, unspecified trimester: Secondary | ICD-10-CM

## 2011-10-04 DIAGNOSIS — O360990 Maternal care for other rhesus isoimmunization, unspecified trimester, not applicable or unspecified: Secondary | ICD-10-CM

## 2011-10-04 NOTE — Patient Instructions (Addendum)
Return to clinic for any obstetric concerns or go to [facility] for evaluation.  CESAREAN SECTION SCHEDULED 10/07/11 AT 830AM Cesarean Delivery  Cesarean delivery is the birth of a baby through a cut (incision) in the abdomen and womb (uterus).  LET YOUR CAREGIVER KNOW ABOUT:  Complicationsinvolving the pregnancy.   Allergies.   Medicines taken including herbs, eyedrops, over-the-counter medicines, and creams.   Use of steroids (by mouth or creams).   Previous problems with anesthetics or numbing medicine.   Previous surgery.   History of blood clots.   History of bleeding or blood problems.   Other health problems.  RISKS AND COMPLICATIONS   Bleeding.   Infection.   Blood clots.   Injury to surrounding organs.   Anesthesia problems.   Injury to the baby.  BEFORE THE PROCEDURE   A tube (Foley catheter) will be placed in your bladder. The Foley catheter drains the urine from your bladder into a bag. This keeps your bladder empty during surgery.   An intravenous access tube (IV) will be placed in your arm.   Hair may be removed from your pubic area and your lower abdomen. This is to prevent infection in the incision site.   You may be given an antacid medicine to drink. This will prevent acid contents in your stomach from going into your lungs if you vomit during the surgery.   You may be given an antibiotic medicine to prevent infection.  PROCEDURE   You may be given medicine to numb the lower half of your body (regional anesthetic). If you were in labor, you may have already had an epidural in place which can be used in both labor and cesarean delivery. You may possibly be given medicine to make you sleep (general anesthetic) though this is not as common.   An incision will be made in your abdomen that extends to your uterus. There are 2 basic kinds of incisions:   The horizontal (transverse) incision. Horizontal incisions are used for most routine cesarean deliveries.     The vertical (up and down) incision. This is less commonly used. This is most often reserved for women who have a serious complication (extreme prematurity) or under emergency situations.   The horizontal and vertical incisions may both be used at the same time. However, this is very uncommon.   Your baby will then be delivered.  AFTER THE PROCEDURE   If you were awake during the surgery, you will see your baby right away. If you were asleep, you will see your baby as soon as you are awake.   You may breastfeed your baby after surgery.   You may be able to get up and walk the same day as the surgery. If you need to stay in bed for a period of time, you will receive help to turn, cough, and take deep breaths after surgery. This helps prevent lung problems such as pneumonia.   Do not get out of bed alone the first time after surgery. You will need help getting out of bed until you are able to do this by yourself.   You may be able to shower the day after your cesarean delivery. After the bandage (dressing) is taken off the incision site, a nurse will assist you to shower, if you like.   You will have pneumatic compressing hose placed on your feet or lower legs. These hose are used to prevent blood clots. When you are up and walking regularly, they will no  longer be necessary.   Do not cross your legs when you sit.   Save any blood clots that you pass. If you pass a clot while on the toilet, do not flush it. Call for the nurse. Tell the nurse if you think you are bleeding too much or passing too many clots.   Start drinking liquids and eating food as directed by your caregiver. If your stomach is not ready, drinking and eating too soon can cause an increase in bloating and swelling of your intestine and abdomen. This is very uncomfortable.   You will be given medicine as needed. Let your caregivers know if you are hurting. They want you to be comfortable. You may also be given an  antibiotic to prevent an infection.   Your IV will be taken out when you are drinking a reasonable amount of fluids. The Foley catheter is taken out when you are up and walking.   If your blood type is Rh negative and your baby's blood type is Rh positive, you will be given a shot of anti-D immune globulin. This shot prevents you from having Rh problems with a future pregnancy. You should get the shot even if you had your tubes tied (tubal ligation).   If you are allowed to take the baby for a walk, place the baby in the bassinet and push it. Do not carry your baby in your arms.  Document Released: 03/06/2005 Document Revised: 02/23/2011 Document Reviewed: 07/01/2010 [facility] Patient Information 2012 [place], [place].

## 2011-10-04 NOTE — Progress Notes (Signed)
Transverse, head on maternal left, back down on bedside ultrasound.  She already underwent an ECV at 37 weeks, does not want to undergo another ECV.  She desires cesarean section for malpresentation. She was told that another ultrasound will be done on the day of scheduled surgery to confirm presentation, if the fetus is vertex, she can be induced for unstable lie. Cesarean section scheduled on 10/07/11 at 0830 with Dr. Erin Fulling.  No other complaints or concerns.  Fetal movement and labor precautions reviewed.

## 2011-10-06 ENCOUNTER — Encounter (HOSPITAL_COMMUNITY): Payer: Self-pay

## 2011-10-06 ENCOUNTER — Encounter (HOSPITAL_COMMUNITY)
Admission: RE | Admit: 2011-10-06 | Discharge: 2011-10-06 | Disposition: A | Discharge status: Home / Self Care | Payer: Medicaid Other | Source: Ambulatory Visit | Attending: Obstetrics & Gynecology | Admitting: Obstetrics & Gynecology

## 2011-10-06 VITALS — BP 120/76 | Ht 67.0 in | Wt 180.0 lb

## 2011-10-06 DIAGNOSIS — O26899 Other specified pregnancy related conditions, unspecified trimester: Secondary | ICD-10-CM

## 2011-10-06 DIAGNOSIS — Z6791 Unspecified blood type, Rh negative: Secondary | ICD-10-CM

## 2011-10-06 DIAGNOSIS — Z3480 Encounter for supervision of other normal pregnancy, unspecified trimester: Secondary | ICD-10-CM

## 2011-10-06 DIAGNOSIS — O322XX0 Maternal care for transverse and oblique lie, not applicable or unspecified: Secondary | ICD-10-CM

## 2011-10-06 LAB — CBC
HCT: 30 % — ABNORMAL LOW (ref 36.0–46.0)
Hemoglobin: 10 g/dL — ABNORMAL LOW (ref 12.0–15.0)
MCHC: 33.3 g/dL (ref 30.0–36.0)
MCV: 85 fL (ref 78.0–100.0)
RDW: 14 % (ref 11.5–15.5)

## 2011-10-06 LAB — SURGICAL PCR SCREEN: Staphylococcus aureus: NEGATIVE

## 2011-10-06 LAB — TYPE AND SCREEN: Antibody Screen: NEGATIVE

## 2011-10-06 NOTE — Patient Instructions (Signed)
YOUR PROCEDURE IS SCHEDULED ON:10/07/11  ENTER THROUGH THE MAIN ENTRANCE OF [facility] AT:7am   USE DESK PHONE AND DIAL [phone] TO INFORM us OF YOUR ARRIVAL  CALL [phone] IF YOU HAVE ANY QUESTIONS OR PROBLEMS PRIOR TO YOUR ARRIVAL.  REMEMBER: DO NOT EAT OR DRINK AFTER MIDNIGHT : tonight    YOU MAY BRUSH YOUR TEETH THE MORNING OF SURGERY   TAKE THESE MEDICINES THE DAY OF SURGERY WITH SIP OF WATER:none   DO NOT WEAR JEWELRY, EYE MAKEUP, LIPSTICK OR DARK FINGERNAIL POLISH DO NOT WEAR LOTIONS  DO NOT SHAVE FOR 48 HOURS PRIOR TO SURGERY

## 2011-10-07 ENCOUNTER — Inpatient Hospital Stay (HOSPITAL_COMMUNITY)
Admission: RE | Admit: 2011-10-07 | Discharge: 2011-10-09 | DRG: 766 | Disposition: A | Discharge status: Home / Self Care | Payer: Medicaid Other | Source: Ambulatory Visit | Attending: Obstetrics & Gynecology | Admitting: Obstetrics & Gynecology

## 2011-10-07 ENCOUNTER — Encounter (HOSPITAL_COMMUNITY): Payer: Self-pay

## 2011-10-07 ENCOUNTER — Encounter (HOSPITAL_COMMUNITY): Payer: Self-pay | Admitting: *Deleted

## 2011-10-07 ENCOUNTER — Encounter (HOSPITAL_COMMUNITY): Payer: Self-pay | Admitting: Anesthesiology

## 2011-10-07 ENCOUNTER — Inpatient Hospital Stay (HOSPITAL_COMMUNITY): Payer: Medicaid Other | Admitting: Anesthesiology

## 2011-10-07 ENCOUNTER — Encounter (HOSPITAL_COMMUNITY)
Admission: RE | Disposition: A | Discharge status: Home / Self Care | Payer: Self-pay | Source: Ambulatory Visit | Attending: Obstetrics & Gynecology

## 2011-10-07 DIAGNOSIS — O322XX0 Maternal care for transverse and oblique lie, not applicable or unspecified: Principal | ICD-10-CM | POA: Diagnosis present

## 2011-10-07 SURGERY — Surgical Case
Anesthesia: Spinal

## 2011-10-07 MED ORDER — ONDANSETRON HCL 4 MG/2ML IJ SOLN
INTRAMUSCULAR | Status: AC
  Filled 2011-10-07: qty 2

## 2011-10-07 MED ORDER — SENNOSIDES-DOCUSATE SODIUM 8.6-50 MG PO TABS
2.0000 | ORAL_TABLET | Freq: Every day | ORAL | Status: DC
  Administered 2011-10-07: 2 via ORAL

## 2011-10-07 MED ORDER — MORPHINE SULFATE (PF) 0.5 MG/ML IJ SOLN
INTRAMUSCULAR | Status: DC | PRN
  Administered 2011-10-07: .15 mg via INTRATHECAL

## 2011-10-07 MED ORDER — ONDANSETRON HCL 4 MG/2ML IJ SOLN: 4.0000 mg | INTRAMUSCULAR | Status: DC | PRN

## 2011-10-07 MED ORDER — LACTATED RINGERS IV SOLN
INTRAVENOUS | Status: DC | PRN
  Administered 2011-10-07: 09:00:00 via INTRAVENOUS

## 2011-10-07 MED ORDER — METOCLOPRAMIDE HCL 5 MG/ML IJ SOLN
10.0000 mg | Freq: Once | INTRAMUSCULAR | Status: AC | PRN
Start: 2011-10-07 — End: 2011-10-07
  Administered 2011-10-07: 10 mg via INTRAVENOUS

## 2011-10-07 MED ORDER — ONDANSETRON HCL 4 MG/2ML IJ SOLN: 4.0000 mg | Freq: Three times a day (TID) | INTRAMUSCULAR | Status: DC | PRN

## 2011-10-07 MED ORDER — FENTANYL CITRATE 0.05 MG/ML IJ SOLN
INTRAMUSCULAR | Status: DC | PRN
  Administered 2011-10-07: 25 ug via INTRATHECAL

## 2011-10-07 MED ORDER — OXYCODONE-ACETAMINOPHEN 5-325 MG PO TABS
1.0000 | ORAL_TABLET | ORAL | Status: DC | PRN
  Administered 2011-10-08 – 2011-10-09 (×2): 1 via ORAL
  Filled 2011-10-07 (×4): qty 1

## 2011-10-07 MED ORDER — OXYTOCIN 10 UNIT/ML IJ SOLN
INTRAMUSCULAR | Status: AC
  Filled 2011-10-07: qty 4

## 2011-10-07 MED ORDER — DIPHENHYDRAMINE HCL 50 MG/ML IJ SOLN: 25.0000 mg | INTRAMUSCULAR | Status: DC | PRN

## 2011-10-07 MED ORDER — PHENYLEPHRINE 40 MCG/ML (10ML) SYRINGE FOR IV PUSH (FOR BLOOD PRESSURE SUPPORT)
PREFILLED_SYRINGE | INTRAVENOUS | Status: AC
  Filled 2011-10-07: qty 5

## 2011-10-07 MED ORDER — EPHEDRINE 5 MG/ML INJ
INTRAVENOUS | Status: AC
  Filled 2011-10-07: qty 20

## 2011-10-07 MED ORDER — IBUPROFEN 600 MG PO TABS
600.0000 mg | ORAL_TABLET | Freq: Four times a day (QID) | ORAL | Status: DC
  Administered 2011-10-07 – 2011-10-09 (×5): 600 mg via ORAL
  Filled 2011-10-07 (×4): qty 1

## 2011-10-07 MED ORDER — LACTATED RINGERS IV SOLN
INTRAVENOUS | Status: DC
  Administered 2011-10-07: 125 mL/h via INTRAVENOUS
  Administered 2011-10-07 (×2): via INTRAVENOUS

## 2011-10-07 MED ORDER — TETANUS-DIPHTH-ACELL PERTUSSIS 5-2.5-18.5 LF-MCG/0.5 IM SUSP: 0.5000 mL | Freq: Once | INTRAMUSCULAR | Status: DC

## 2011-10-07 MED ORDER — DIPHENHYDRAMINE HCL 25 MG PO CAPS
25.0000 mg | ORAL_CAPSULE | ORAL | Status: DC | PRN
  Administered 2011-10-08: 25 mg via ORAL
  Filled 2011-10-07 (×2): qty 1

## 2011-10-07 MED ORDER — EPHEDRINE 5 MG/ML INJ
INTRAVENOUS | Status: AC
  Filled 2011-10-07: qty 10

## 2011-10-07 MED ORDER — KETOROLAC TROMETHAMINE 30 MG/ML IJ SOLN
INTRAMUSCULAR | Status: AC
  Administered 2011-10-07: 30 mg via INTRAVENOUS
  Filled 2011-10-07: qty 1

## 2011-10-07 MED ORDER — ONDANSETRON HCL 4 MG PO TABS: 4.0000 mg | ORAL_TABLET | ORAL | Status: DC | PRN

## 2011-10-07 MED ORDER — CEFAZOLIN SODIUM-DEXTROSE 2-3 GM-% IV SOLR
INTRAVENOUS | Status: AC
  Filled 2011-10-07: qty 50

## 2011-10-07 MED ORDER — METHYLERGONOVINE MALEATE 0.2 MG/ML IJ SOLN
INTRAMUSCULAR | Status: AC
  Filled 2011-10-07: qty 1

## 2011-10-07 MED ORDER — MEPERIDINE HCL 25 MG/ML IJ SOLN: 6.2500 mg | INTRAMUSCULAR | Status: DC | PRN

## 2011-10-07 MED ORDER — EPHEDRINE SULFATE 50 MG/ML IJ SOLN
INTRAMUSCULAR | Status: DC | PRN
  Administered 2011-10-07 (×3): 10 mg via INTRAVENOUS
  Administered 2011-10-07: 5 mg via INTRAVENOUS
  Administered 2011-10-07 (×3): 10 mg via INTRAVENOUS
  Administered 2011-10-07: 5 mg via INTRAVENOUS

## 2011-10-07 MED ORDER — SODIUM CHLORIDE 0.9 % IJ SOLN: 3.0000 mL | INTRAMUSCULAR | Status: DC | PRN

## 2011-10-07 MED ORDER — METOCLOPRAMIDE HCL 5 MG/ML IJ SOLN
INTRAMUSCULAR | Status: AC
  Administered 2011-10-07: 10 mg via INTRAVENOUS
  Filled 2011-10-07: qty 2

## 2011-10-07 MED ORDER — KETOROLAC TROMETHAMINE 30 MG/ML IJ SOLN: 30.0000 mg | Freq: Four times a day (QID) | INTRAMUSCULAR | Status: AC | PRN

## 2011-10-07 MED ORDER — FENTANYL CITRATE 0.05 MG/ML IJ SOLN: 25.0000 ug | INTRAMUSCULAR | Status: DC | PRN

## 2011-10-07 MED ORDER — LANOLIN HYDROUS EX OINT: 1.0000 "application " | TOPICAL_OINTMENT | CUTANEOUS | Status: DC | PRN

## 2011-10-07 MED ORDER — IBUPROFEN 600 MG PO TABS
600.0000 mg | ORAL_TABLET | Freq: Four times a day (QID) | ORAL | Status: DC | PRN
  Filled 2011-10-07 (×3): qty 1

## 2011-10-07 MED ORDER — SIMETHICONE 80 MG PO CHEW
80.0000 mg | CHEWABLE_TABLET | Freq: Three times a day (TID) | ORAL | Status: DC
  Administered 2011-10-07 – 2011-10-09 (×5): 80 mg via ORAL

## 2011-10-07 MED ORDER — FENTANYL CITRATE 0.05 MG/ML IJ SOLN
INTRAMUSCULAR | Status: AC
  Filled 2011-10-07: qty 2

## 2011-10-07 MED ORDER — SCOPOLAMINE 1 MG/3DAYS TD PT72
1.0000 | MEDICATED_PATCH | Freq: Once | TRANSDERMAL | Status: DC
  Administered 2011-10-07: 1.5 mg via TRANSDERMAL

## 2011-10-07 MED ORDER — MENTHOL 3 MG MT LOZG: 1.0000 | LOZENGE | OROMUCOSAL | Status: DC | PRN

## 2011-10-07 MED ORDER — NALOXONE HCL 0.4 MG/ML IJ SOLN: 0.4000 mg | INTRAMUSCULAR | Status: DC | PRN

## 2011-10-07 MED ORDER — WITCH HAZEL-GLYCERIN EX PADS: 1.0000 "application " | MEDICATED_PAD | CUTANEOUS | Status: DC | PRN

## 2011-10-07 MED ORDER — BUPIVACAINE IN DEXTROSE 0.75-8.25 % IT SOLN
INTRATHECAL | Status: DC | PRN
  Administered 2011-10-07: 14 mg via INTRATHECAL

## 2011-10-07 MED ORDER — MORPHINE SULFATE 0.5 MG/ML IJ SOLN
INTRAMUSCULAR | Status: AC
  Filled 2011-10-07: qty 10

## 2011-10-07 MED ORDER — DIPHENHYDRAMINE HCL 50 MG/ML IJ SOLN
INTRAMUSCULAR | Status: AC
  Administered 2011-10-07: 12.5 mg via INTRAVENOUS
  Filled 2011-10-07: qty 1

## 2011-10-07 MED ORDER — KETOROLAC TROMETHAMINE 30 MG/ML IJ SOLN
30.0000 mg | Freq: Four times a day (QID) | INTRAMUSCULAR | Status: AC | PRN
  Administered 2011-10-07: 30 mg via INTRAVENOUS

## 2011-10-07 MED ORDER — METOCLOPRAMIDE HCL 5 MG/ML IJ SOLN: 10.0000 mg | Freq: Three times a day (TID) | INTRAMUSCULAR | Status: DC | PRN

## 2011-10-07 MED ORDER — NALOXONE HCL 0.4 MG/ML IJ SOLN
1.0000 ug/kg/h | INTRAMUSCULAR | Status: DC | PRN
  Filled 2011-10-07: qty 2.5

## 2011-10-07 MED ORDER — ZOLPIDEM TARTRATE 5 MG PO TABS: 5.0000 mg | ORAL_TABLET | Freq: Every evening | ORAL | Status: DC | PRN

## 2011-10-07 MED ORDER — SCOPOLAMINE 1 MG/3DAYS TD PT72
MEDICATED_PATCH | TRANSDERMAL | Status: AC
  Filled 2011-10-07: qty 1

## 2011-10-07 MED ORDER — PHENYLEPHRINE HCL 10 MG/ML IJ SOLN
INTRAMUSCULAR | Status: DC | PRN
  Administered 2011-10-07 (×11): 40 ug via INTRAVENOUS

## 2011-10-07 MED ORDER — NALBUPHINE HCL 10 MG/ML IJ SOLN
5.0000 mg | INTRAMUSCULAR | Status: DC | PRN
  Administered 2011-10-07: 10 mg via INTRAVENOUS
  Filled 2011-10-07: qty 1

## 2011-10-07 MED ORDER — SIMETHICONE 80 MG PO CHEW: 80.0000 mg | CHEWABLE_TABLET | ORAL | Status: DC | PRN

## 2011-10-07 MED ORDER — DIPHENHYDRAMINE HCL 25 MG PO CAPS: 25.0000 mg | ORAL_CAPSULE | Freq: Four times a day (QID) | ORAL | Status: DC | PRN

## 2011-10-07 MED ORDER — LACTATED RINGERS IV SOLN
INTRAVENOUS | Status: DC
  Administered 2011-10-07 – 2011-10-08 (×3): via INTRAVENOUS

## 2011-10-07 MED ORDER — OXYTOCIN 40 UNITS IN LACTATED RINGERS INFUSION - SIMPLE MED: 62.5000 mL/h | INTRAVENOUS | Status: AC

## 2011-10-07 MED ORDER — CEFAZOLIN SODIUM-DEXTROSE 2-3 GM-% IV SOLR
2.0000 g | INTRAVENOUS | Status: AC
  Administered 2011-10-07: 2 g via INTRAVENOUS

## 2011-10-07 MED ORDER — NALBUPHINE HCL 10 MG/ML IJ SOLN
5.0000 mg | INTRAMUSCULAR | Status: DC | PRN
  Filled 2011-10-07 (×2): qty 1

## 2011-10-07 MED ORDER — DIPHENHYDRAMINE HCL 50 MG/ML IJ SOLN
12.5000 mg | INTRAMUSCULAR | Status: DC | PRN
  Administered 2011-10-07 (×2): 12.5 mg via INTRAVENOUS
  Filled 2011-10-07: qty 1

## 2011-10-07 MED ORDER — OXYTOCIN 40 UNITS IN LACTATED RINGERS INFUSION - SIMPLE MED
INTRAVENOUS | Status: DC | PRN
  Administered 2011-10-07 (×2): 40 [IU] via INTRAVENOUS

## 2011-10-07 MED ORDER — PRENATAL MULTIVITAMIN CH
1.0000 | ORAL_TABLET | Freq: Every day | ORAL | Status: DC
  Administered 2011-10-08 – 2011-10-09 (×2): 1 via ORAL
  Filled 2011-10-07 (×2): qty 1

## 2011-10-07 MED ORDER — DIBUCAINE 1 % RE OINT: 1.0000 "application " | TOPICAL_OINTMENT | RECTAL | Status: DC | PRN

## 2011-10-07 MED ORDER — METHYLERGONOVINE MALEATE 0.2 MG/ML IJ SOLN
INTRAMUSCULAR | Status: DC | PRN
  Administered 2011-10-07: 0.2 mg via INTRAMUSCULAR

## 2011-10-07 SURGICAL SUPPLY — 36 items
CHLORAPREP W/TINT 26ML (MISCELLANEOUS) ×2 IMPLANT
CLOTH BEACON ORANGE TIMEOUT ST (SAFETY) ×2 IMPLANT
DERMABOND ADVANCED (GAUZE/BANDAGES/DRESSINGS) ×1
DERMABOND ADVANCED .7 DNX12 (GAUZE/BANDAGES/DRESSINGS) ×1 IMPLANT
DRESSING TELFA 8X3 (GAUZE/BANDAGES/DRESSINGS) ×2 IMPLANT
ELECT REM PT RETURN 9FT ADLT (ELECTROSURGICAL) ×2
ELECTRODE REM PT RTRN 9FT ADLT (ELECTROSURGICAL) ×1 IMPLANT
EXTRACTOR VACUUM M CUP 4 TUBE (SUCTIONS) IMPLANT
GAUZE SPONGE 4X4 12PLY STRL LF (GAUZE/BANDAGES/DRESSINGS) ×2 IMPLANT
GLOVE BIO SURGEON STRL SZ7 (GLOVE) ×2 IMPLANT
GLOVE BIOGEL PI IND STRL 7.0 (GLOVE) ×1 IMPLANT
GLOVE BIOGEL PI INDICATOR 7.0 (GLOVE) ×1
GLOVE INDICATOR 7.5 STRL GRN (GLOVE) ×4 IMPLANT
GLOVE SURG SS PI 7.5 STRL IVOR (GLOVE) ×6 IMPLANT
GOWN PREVENTION PLUS LG XLONG (DISPOSABLE) ×4 IMPLANT
GOWN PREVENTION PLUS XLARGE (GOWN DISPOSABLE) ×2 IMPLANT
KIT ABG SYR 3ML LUER SLIP (SYRINGE) IMPLANT
NEEDLE HYPO 22GX1.5 SAFETY (NEEDLE) ×2 IMPLANT
NEEDLE HYPO 25X5/8 SAFETYGLIDE (NEEDLE) IMPLANT
NS IRRIG 1000ML POUR BTL (IV SOLUTION) ×2 IMPLANT
PACK C SECTION WH (CUSTOM PROCEDURE TRAY) ×2 IMPLANT
PAD ABD 7.5X8 STRL (GAUZE/BANDAGES/DRESSINGS) ×4 IMPLANT
RTRCTR C-SECT PINK 25CM LRG (MISCELLANEOUS) ×2 IMPLANT
SLEEVE SCD COMPRESS KNEE MED (MISCELLANEOUS) IMPLANT
SPONGE LAP 18X18 X RAY DECT (DISPOSABLE) ×4 IMPLANT
SUT VIC AB 0 CT1 36 (SUTURE) ×6 IMPLANT
SUT VIC AB 0 CTX 36 (SUTURE) ×3
SUT VIC AB 0 CTX36XBRD ANBCTRL (SUTURE) ×3 IMPLANT
SUT VIC AB 3-0 CT1 27 (SUTURE) ×2
SUT VIC AB 3-0 CT1 TAPERPNT 27 (SUTURE) ×2 IMPLANT
SUT VIC AB 4-0 KS 27 (SUTURE) ×4 IMPLANT
SUT VIC AB 4-0 PS2 27 (SUTURE) ×2 IMPLANT
TAPE CLOTH SURG 4X10 WHT LF (GAUZE/BANDAGES/DRESSINGS) ×2 IMPLANT
TOWEL OR 17X24 6PK STRL BLUE (TOWEL DISPOSABLE) ×4 IMPLANT
TRAY FOLEY CATH 14FR (SET/KITS/TRAYS/PACK) ×2 IMPLANT
WATER STERILE IRR 1000ML POUR (IV SOLUTION) ×2 IMPLANT

## 2011-10-07 NOTE — Plan of Care (Signed)
Problem: Phase II Progression Outcomes Goal: Rh isoimmunization per orders Outcome: Not Applicable Date Met:  10/07/11 Baby is O neg blood type.

## 2011-10-07 NOTE — Op Note (Signed)
Starr Lake PROCEDURE DATE: 10/07/2011  PREOPERATIVE DIAGNOSIS: Intrauterine pregnancy at  [redacted]w[redacted]d weeks gestation; malpresentation: transverse lie  POSTOPERATIVE DIAGNOSIS: The same  PROCEDURE: Primary Low Transverse Cesarean Section  SURGEON:  Dr. Willodean Rosenthal  ASSISTANT: Dr Candelaria Celeste  INDICATIONS: [name] is a 26 y.o. [id number] at [redacted]w[redacted]d scheduled for cesarean section secondary to malpresentation: transverse presentation.  The risks of cesarean section discussed with the patient included but were not limited to: bleeding which may require transfusion or reoperation; infection which may require antibiotics; injury to bowel, bladder, ureters or other surrounding organs; injury to the fetus; need for additional procedures including hysterectomy in the event of a life-threatening hemorrhage; placental abnormalities wth subsequent pregnancies, incisional problems, thromboembolic phenomenon and other postoperative/anesthesia complications. The patient concurred with the proposed plan, giving informed written consent for the procedure.    FINDINGS:  Viable female infant in transverse presentation.  Apgars 9 and 9, weight pending.  Clear amniotic fluid.  Intact placenta, three vessel cord.  Normal uterus, fallopian tubes and ovaries bilaterally.  Dense lower uterine segment varicosities.  ANESTHESIA:    Spinal INTRAVENOUS FLUIDS:2300 ml ESTIMATED BLOOD LOSS: 800 ml URINE OUTPUT:  200 ml SPECIMENS: Placenta sent to L&D COMPLICATIONS: None immediate  PROCEDURE IN DETAIL:  The patient received intravenous antibiotics and had sequential compression devices applied to her lower extremities while in the preoperative area.  She was then taken to the operating room where spinal anesthesia was administered and was found to be adequate. She was then placed in a dorsal supine position with a leftward tilt, and prepped and draped in a sterile manner.  A foley catheter was placed into her  bladder and attached to constant gravity, which drained clear fluid throughout.  After an adequate timeout was performed, a Pfannenstiel skin incision was made with scalpel and carried through to the underlying layer of fascia. The fascia was incised in the midline and this incision was extended bilaterally using the Mayo scissors. Kocher clamps were applied to the superior aspect of the fascial incision and the underlying rectus muscles were dissected off bluntly. A similar process was carried out on the inferior aspect of the facial incision. The rectus muscles were separated in the midline bluntly and the peritoneum was entered bluntly. Attention was turned to the lower uterine segment where dense varicosities were seen.  A transverse hysterotomy was made with a scalpel above the varicosities and extended bilaterally bluntly. The bladder blade was then removed. The infant was successfully delivered, and cord was clamped and cut and infant was handed over to awaiting neonatology team. Uterine massage was then administered and the placenta was found to be adhered to the uterus in a "pre-accreta" fashion.  The placenta was removed with difficulty, but was delivered intact with three-vessel cord. On inspection, the posterior wall of the left lower uterine segment was adhered to the left anterior wall of the uterus.  The uterus was then cleared of clot and debris.  The hysterotomy was closed with 0 Vicryl in a running locked fashion, and an imbricating layer was also placed with a 0 Vicryl. Overall, excellent hemostasis was noted. The abdomen and the pelvis were cleared of all clot and debris. Hemostasis was confirmed on all surfaces.  The rectus muscles and peritoneum was reapproximated using 3-0 vicryl running stitches. The fascia was then closed using 0 Vicryl in a running fashion.  The skin was closed with 4-0 Vicryl and dermabond. The patient tolerated the procedure well. Sponge, lap, instrument and  needle counts  were correct x 2. She was taken to the recovery room in stable condition.    Levie Heritage, DO 10/07/2011 9:49 AM

## 2011-10-07 NOTE — Anesthesia Postprocedure Evaluation (Signed)
  Anesthesia Post-op Note  Patient: [name]  Procedure(s) Performed: Procedure(s) (LRB): CESAREAN SECTION (N/A)  Patient Location: PACU  Anesthesia Type: Epidural  Level of Consciousness: awake, alert  and oriented  Airway and Oxygen Therapy: Patient Spontanous Breathing  Post-op Pain: none  Post-op Assessment: Post-op Vital signs reviewed, Patient's Cardiovascular Status Stable, Respiratory Function Stable, Patent Airway, No signs of Nausea or vomiting, Pain level controlled, No headache, No backache, No residual numbness and No residual motor weakness  Post-op Vital Signs: Reviewed and stable  Complications: No apparent anesthesia complications

## 2011-10-07 NOTE — H&P (Signed)
[name] is a 26 y.o. female G2P1001 with IUP at [redacted]w[redacted]d presenting for primary cesarean delivery for unstable lie. She had a ECV that was successful, but baby moved transverse again.  Pt states she has been having no contractions, no vaginal bleeding, intact membranes, with active fetal movement.    Prenatal Course Source of Care: [facility]  with onset of care at 4weeks Pregnancy complications or risks: Patient Active Problem List  Diagnosis  . Supervision of other normal pregnancy  . Rh negative state in antepartum period  . Migraine headache without aura  . Anxiety  . Transverse fetal lie, antepartum   She desires to IUD.  She plans to plans to bottle feed  Prenatal labs and studies: ABO, Rh: --/--/O NEG (07/19 1600) Antibody: NEG (07/19 1600) Rubella:  immune RPR: NON REACTIVE (07/19 1600)  HBsAg: NEGATIVE (11/19 1402)  HIV: NON REACTIVE (04/25 1541)  GBS: Negative (06/24 0000)  1 hr Glucola 118 Genetic screening declined Anatomy US normal  Past Medical History:  Past Medical History  Diagnosis Date  . Rh negative state in antepartum period   . Abnormal Pap smear   . No pertinent past medical history     Past Surgical History:  Past Surgical History  Procedure Date  . Tonsillectomy     26 yrs old    Obstetrical History:  OB History    Grav Para Term Preterm Abortions TAB SAB Ect Mult Living   2 1 1  0 0 0 0 0 0 1      Social History:  History   Social History  . Marital Status: Married    Spouse Name: N/A    Number of Children: N/A  . Years of Education: N/A   Social History Main Topics  . Smoking status: Never Smoker   . Smokeless tobacco: Never Used  . Alcohol Use: No  . Drug Use: No  . Sexually Active: Yes -- Female partner(s)    Birth Control/ Protection: None   Other Topics Concern  . None   Social History Narrative  . None    Family History:  Family History  Problem Relation Age of Onset  . Diabetes Maternal Grandmother   .  Diabetes Maternal Grandfather   . Anesthesia problems Neg Hx   . Hypotension Neg Hx   . Malignant hyperthermia Neg Hx   . Pseudochol deficiency Neg Hx     Medications:  Prenatal vitamins,  Current Facility-Administered Medications  Medication Dose Route Frequency Provider Last Rate Last Dose  . ceFAZolin (ANCEF) 2-3 GM-% IVPB SOLR           . ceFAZolin (ANCEF) IVPB 2 g/50 mL premix  2 g Intravenous On Call to OR Willodean Rosenthal, MD      . lactated ringers infusion   Intravenous Continuous Willodean Rosenthal, MD      . scopolamine (TRANSDERM-SCOP) 1.5 MG 1.5 mg  1 patch Transdermal Once Casimiro Needle A. Malen Gauze, MD   1.5 mg at 10/07/11 0721  . scopolamine (TRANSDERM-SCOP) 1.5 MG             Allergies: No Known Allergies  Review of Systems: negative  Physical Exam: Blood pressure 121/78, pulse 76, temperature 98.1 F (36.7 C), temperature source Oral, resp. rate 16, last menstrual period 01/07/2011, SpO2 99.00%. GENERAL: Well-developed, well-nourished female in no acute distress.  LUNGS: Clear to auscultation bilaterally.  HEART: Regular rate and rhythm. ABDOMEN: Soft, nontender, nondistended, gravid. EFW 7# lbs EXTREMITIES: Nontender, no edema, 2+ distal  pulses.    Pertinent Labs/Studies:  Bedside US done - transverse lie  Assessment : [name] is a 26 y.o. G2P1001 at [redacted]w[redacted]d being admitted for primary cesarean delivery for transverse lie.  Plan: The risks of cesarean section discussed with the patient included but were not limited to: bleeding which may require transfusion or reoperation; infection which may require antibiotics; injury to bowel, bladder, ureters or other surrounding organs; injury to the fetus; need for additional procedures including hysterectomy in the event of a life-threatening hemorrhage; placental abnormalities wth subsequent pregnancies, incisional problems, thromboembolic phenomenon and other postoperative/anesthesia complications. The patient  concurred with the proposed plan, giving informed written consent for the procedure.   Patient has been NPO since 7pm she will remain NPO for procedure. Anesthesia and OR aware.  Preoperative prophylactic Ancef ordered on call to the OR.  To OR when ready.   Abdulloh Ullom JEHIEL 10/07/2011, 7:41 AM

## 2011-10-07 NOTE — Anesthesia Procedure Notes (Signed)
Spinal  Patient location during procedure: OR Start time: 10/07/2011 8:50 AM Staffing Anesthesiologist: Jabar Krysiak A. Performed by: anesthesiologist  Preanesthetic Checklist Completed: patient identified, site marked, surgical consent, pre-op evaluation, timeout performed, IV checked, risks and benefits discussed and monitors and equipment checked Spinal Block Patient position: sitting Prep: site prepped and draped and DuraPrep Patient monitoring: heart rate, cardiac monitor, continuous pulse ox and blood pressure Approach: midline Location: L3-4 Injection technique: single-shot Needle Needle type: Sprotte  Needle gauge: 24 G Needle length: 9 cm Assessment Sensory level: T4 Additional Notes Patient tolerated procedure well. Adequate sensory level.

## 2011-10-07 NOTE — Transfer of Care (Signed)
Immediate Anesthesia Transfer of Care Note  Patient: [name]  Procedure(s) Performed: Procedure(s) (LRB): CESAREAN SECTION (N/A)  Patient Location: PACU  Anesthesia Type: Spinal  Level of Consciousness: awake, alert  and oriented  Airway & Oxygen Therapy: Patient Spontanous Breathing  Post-op Assessment: Report given to PACU RN and Post -op Vital signs reviewed and stable  Post vital signs: stable  Complications: No apparent anesthesia complications

## 2011-10-07 NOTE — Anesthesia Preprocedure Evaluation (Signed)
Anesthesia Evaluation  Patient identified by MRN, date of birth, ID band Patient awake    Reviewed: Allergy & Precautions, H&P , NPO status , Patient's Chart, lab work & pertinent test results  Airway Mallampati: II TM Distance: >3 FB Neck ROM: full    Dental No notable dental hx. (+) Teeth Intact   Pulmonary neg pulmonary ROS,  breath sounds clear to auscultation  Pulmonary exam normal       Cardiovascular negative cardio ROS  Rhythm:regular Rate:Normal     Neuro/Psych  Headaches, negative psych ROS   GI/Hepatic Neg liver ROS, GERD-  ,  Endo/Other  negative endocrine ROS  Renal/GU negative Renal ROS  negative genitourinary   Musculoskeletal   Abdominal Normal abdominal exam  (+)   Peds  Hematology negative hematology ROS (+)   Anesthesia Other Findings Pierced tongue  Reproductive/Obstetrics (+) Pregnancy                           Anesthesia Physical Anesthesia Plan  ASA: II  Anesthesia Plan: Spinal   Post-op Pain Management:    Induction:   Airway Management Planned:   Additional Equipment:   Intra-op Plan:   Post-operative Plan:   Informed Consent: I have reviewed the patients History and Physical, chart, labs and discussed the procedure including the risks, benefits and alternatives for the proposed anesthesia with the patient or authorized representative who has indicated his/her understanding and acceptance.     Plan Discussed with: Anesthesiologist  Anesthesia Plan Comments:         Anesthesia Quick Evaluation

## 2011-10-07 NOTE — Addendum Note (Signed)
Addendum  created 10/07/11 1843 by Len Blalock, CRNA   Modules edited:Notes Section

## 2011-10-07 NOTE — Anesthesia Postprocedure Evaluation (Signed)
  Anesthesia Post-op Note  Patient: [name]  Procedure(s) Performed: Procedure(s) (LRB): CESAREAN SECTION (N/A)  Patient Location: PACU and Mother/Baby  Anesthesia Type: Spinal  Level of Consciousness: awake, alert  and oriented  Airway and Oxygen Therapy: Patient Spontanous Breathing   Post-op Assessment: Patient's Cardiovascular Status Stable and Respiratory Function Stable  Post-op Vital Signs: stable  Complications: No apparent anesthesia complications

## 2011-10-08 LAB — CBC
Hemoglobin: 7.1 g/dL — ABNORMAL LOW (ref 12.0–15.0)
MCH: 28.7 pg (ref 26.0–34.0)
MCV: 86.2 fL (ref 78.0–100.0)
Platelets: 183 10*3/uL (ref 150–400)
RBC: 2.47 MIL/uL — ABNORMAL LOW (ref 3.87–5.11)
WBC: 9.8 10*3/uL (ref 4.0–10.5)

## 2011-10-08 NOTE — Progress Notes (Signed)
Ambulated to restroom, unable to void at this time, peri care done, pads changed, ambulated back to bed without difficulty

## 2011-10-08 NOTE — Progress Notes (Signed)
CSW spoke with MOB about anxiety.  MOB reported hx was from several years ago.  No current concerns.  Patient was referred for history of depression/anxiety. * Referral screened out by Clinical Social Worker because none of the following criteria appear to apply: ~ History of anxiety/depression during this pregnancy, or of post-partum depression. ~ Diagnosis of anxiety and/or depression within last 3 years ~ History of depression due to pregnancy loss/loss of child OR * Patient's symptoms currently being treated with medication and/or therapy. Please contact the Clinical Social Worker if needs arise, or by the patient's request.

## 2011-10-08 NOTE — Progress Notes (Signed)
Subjective: Postpartum/postop Day 1: Primary Cesarean Delivery at 39 weeks and 0 days gestation for transverse lie that initially responded to ECV (last week) but reverted back to transverse.  Ms. Pillay reports trouble voiding (they removed her Foley ~ 6 hrs ago and she has not urinated yet) and no flatus.  She has been able to eat some, though, with no N/V.  Her pain is controlled without medications.  She is bottle feeding.   Objective: Vital signs in last 24 hours: Temp:  [97.3 F (36.3 C)-98.2 F (36.8 C)] 97.8 F (36.6 C) (07/20 2130) Pulse Rate:  [60-120] 78  (07/20 2130) Resp:  [16-20] 16  (07/20 2130) BP: (86-123)/(57-77) 105/60 mmHg (07/20 2130) SpO2:  [98 %-100 %] 100 % (07/21 0145) Weight:  [81.647 kg (180 lb)] 81.647 kg (180 lb) (07/20 1000)  Intake/Output      07/20 0701 - 07/21 0700 07/21 0701 - 07/22 0700   P.O. 2160    I.V. (mL/kg) 5393.8 (66.1)    Total Intake(mL/kg) 7553.8 (92.5)    Urine (mL/kg/hr) 2225 (1.1)    Emesis 25    Blood 800    Total Output 3050    Net +4503.8   Physical Exam:  General: alert, cooperative and no distress Lochia: appropriate Abdomen: Sluggish bowel sounds but only mildly tender Uterine Fundus: firm Incision: healing well, no significant drainage, no significant erythema DVT Evaluation: No evidence of DVT seen on physical exam.   Basename 10/08/11 0500 10/06/11 1600  HGB 7.1* 10.0*  HCT 21.3* 30.0*    Assessment/Plan: Status post Cesarean section. Doing well postoperatively.   May have to I&O cath vs. Replace Foley if no voiding.  Encouraged ambulation.  Junious Silk S 10/08/2011, 7:46 AM

## 2011-10-09 ENCOUNTER — Encounter (HOSPITAL_COMMUNITY): Payer: Self-pay | Admitting: Obstetrics & Gynecology

## 2011-10-09 MED ORDER — IBUPROFEN 600 MG PO TABS: 600.0000 mg | ORAL_TABLET | Freq: Four times a day (QID) | ORAL | Status: AC | PRN

## 2011-10-09 MED ORDER — OXYCODONE-ACETAMINOPHEN 5-325 MG PO TABS: 1.0000 | ORAL_TABLET | ORAL | Status: AC | PRN

## 2011-10-09 NOTE — Progress Notes (Signed)
UR chart review completed.  

## 2011-10-09 NOTE — Progress Notes (Signed)
I have seen and examined this patient and I agree with the above. Cam Hai 9:55 AM 10/09/2011

## 2011-10-09 NOTE — Discharge Summary (Signed)
Obstetric Discharge Summary Reason for Admission: cesarean section @ 39 weeks, 0 days secondary to transverse lie. Prenatal Procedures: ultrasound Intrapartum Procedures: cesarean: low cervical, transverse Postpartum Procedures: none Complications-Operative and Postpartum: none  Pt has no complaints, up ad lib, denies dizziness. Pain is well controlled with Percocet. Minimal bleeding. Voiding and passing gas. Bottle-feeding, plans for IUD at follow-up. Pt amenable to discharge today.  Hemoglobin  Date Value Range Status  10/08/2011 7.1* 12.0 - 15.0 g/dL Final     DELTA CHECK NOTED     REPEATED TO VERIFY     HCT  Date Value Range Status  10/08/2011 21.3* 36.0 - 46.0 % Final    Physical Exam:  General: alert, cooperative and no distress Lochia: appropriate Uterine Fundus: firm Incision: healing well, no significant drainage, no dehiscence, no significant erythema DVT Evaluation: No evidence of DVT seen on physical exam.  Discharge Diagnoses: Term Pregnancy-delivered  Discharge Information: Date: 10/09/2011 Activity: pelvic rest Diet: routine Medications: Ibuprofen and Percocet Condition: stable Instructions: refer to practice specific booklet Discharge to: home Follow-up Information    Follow up with [facility].   Contact information:   [address] [place] [address]          Newborn Data: Live born female  Birth Weight: 6 lb 8.4 oz (2960 g) APGAR: 9, 9  Home with mother.  Latina Craver 10/09/2011, 7:33 AM  I have seen and examined this patient and I agree with the above. Clelia Croft, KIMBERLY 8:06 AM 10/09/2011

## 2011-10-11 NOTE — Op Note (Signed)
Jakera Beaupre L. Harraway-Smith, M.D., FACOG  

## 2011-10-31 ENCOUNTER — Ambulatory Visit (INDEPENDENT_AMBULATORY_CARE_PROVIDER_SITE_OTHER): Payer: Medicaid Other | Admitting: Obstetrics & Gynecology

## 2011-10-31 VITALS — BP 125/85 | HR 85 | Ht 67.0 in | Wt 160.5 lb

## 2011-10-31 DIAGNOSIS — O9279 Other disorders of lactation: Secondary | ICD-10-CM | POA: Insufficient documentation

## 2011-10-31 NOTE — Progress Notes (Signed)
Breastfed for one week, breasts are now dry, but she has a painful lump on right breast.

## 2011-10-31 NOTE — Progress Notes (Signed)
  Subjective:    Patient ID: Starr Lake, female    DOB: [birth year], 26 y.o.   MRN: [id number]  HPI3 weeks postpartum, CS, stopped nursing 1-2 weeks ago, right breat sore, not red, no feve. Current Outpatient Prescriptions on File Prior to Visit  Medication Sig Dispense Refill  . Prenatal Vit-Fe Fumarate-FA (PRENATAL MULTIVITAMIN) TABS Take 1 tablet by mouth every morning.       No Known Allergies Past Medical History  Diagnosis Date  . Rh negative state in antepartum period   . Abnormal Pap smear   . No pertinent past medical history    Past Surgical History  Procedure Date  . Tonsillectomy     26 yrs old  . Cesarean section 10/07/2011    Procedure: CESAREAN SECTION;  Surgeon: Willodean Rosenthal, MD;  Location: [facility];  Service: Obstetrics;  Laterality: N/A;    Review of Systems  Constitutional: Positive for fatigue. Negative for fever and chills.   Filed Vitals:   10/31/11 0940  BP: 125/85  Pulse: 85  Height: 5\' 7"  (1.702 m)  Weight: 160 lb 8 oz (72.802 kg)   Filed Vitals:   10/31/11 0940  BP: 125/85  Pulse: 85  Height: 5\' 7"  (1.702 m)  Weight: 160 lb 8 oz (72.802 kg)   afebrile    Objective:   Physical Exam  Pulmonary/Chest:       No breast redness, firm mildly tender areas both sides lateral c/w engorged glands          Assessment & Plan:  Breast engorgement, no sign mastitis    Cold packs, no stimulation, NSAID, report if sx worsen RTC 1 week  Zayveon Raschke 10/31/2011 10:31 AM

## 2011-10-31 NOTE — Patient Instructions (Addendum)
Breastfeeding BENEFITS OF BREASTFEEDING For the baby  The first milk (colostrum) helps the baby's digestive system function better.   There are antibodies from the mother in the milk that help the baby fight off infections.   The baby has a lower incidence of asthma, allergies, and SIDS (sudden infant death syndrome).   The nutrients in breast milk are better than formulas for the baby and helps the baby's brain grow better.   Babies who breastfeed have less gas, colic, and constipation.  For the mother  Breastfeeding helps develop a very special bond between mother and baby.   It is more convenient, always available at the correct temperature and cheaper than formula feeding.   It burns calories in the mother and helps with losing weight that was gained during pregnancy.   It makes the uterus contract back down to normal size faster and slows bleeding following delivery.   Breastfeeding mothers have a lower risk of developing breast cancer.  NURSE FREQUENTLY  A healthy, full-term baby may breastfeed as often as every hour or space his or her feedings to every 3 hours.   How often to nurse will vary from baby to baby. Watch your baby for signs of hunger, not the clock.   Nurse as often as the baby requests, or when you feel the need to reduce the fullness of your breasts.   Awaken the baby if it has been 3 to 4 hours since the last feeding.   Frequent feeding will help the mother make more milk and will prevent problems like sore nipples and engorgement of the breasts.  BABY'S POSITION AT THE BREAST  Whether lying down or sitting, be sure that the baby's tummy is facing your tummy.   Support the breast with 4 fingers underneath the breast and the thumb above. Make sure your fingers are well away from the nipple and baby's mouth.   Stroke the baby's lips and cheek closest to the breast gently with your finger or nipple.   When the baby's mouth is open wide enough, place all  of your nipple and as much of the dark area around the nipple as possible into your baby's mouth.   Pull the baby in close so the tip of the nose and the baby's cheeks touch the breast during the feeding.  FEEDINGS  The length of each feeding varies from baby to baby and from feeding to feeding.   The baby must suck about 2 to 3 minutes for your milk to get to him or her. This is called a "let down." For this reason, allow the baby to feed on each breast as long as he or she wants. Your baby will end the feeding when he or she has received the right balance of nutrients.   To break the suction, put your finger into the corner of the baby's mouth and slide it between his or her gums before removing your breast from his or her mouth. This will help prevent sore nipples.  REDUCING BREAST ENGORGEMENT  In the first week after your baby is born, you may experience signs of breast engorgement. When breasts are engorged, they feel heavy, warm, full, and may be tender to the touch. You can reduce engorgement if you:   Nurse frequently, every 2 to 3 hours. Mothers who breastfeed early and often have fewer problems with engorgement.   Place light ice packs on your breasts between feedings. This reduces swelling. Wrap the ice packs in a   lightweight towel to protect your skin.   Apply moist hot packs to your breast for 5 to 10 minutes before each feeding. This increases circulation and helps the milk flow.   Gently massage your breast before and during the feeding.   Make sure that the baby empties at least one breast at every feeding before switching sides.   Use a breast pump to empty the breasts if your baby is sleepy or not nursing well. You may also want to pump if you are returning to work or or you feel you are getting engorged.   Avoid bottle feeds, pacifiers or supplemental feedings of water or juice in place of breastfeeding.   Be sure the baby is latched on and positioned properly while  breastfeeding.   Prevent fatigue, stress, and anemia.   Wear a supportive bra, avoiding underwire styles.   Eat a balanced diet with enough fluids.  If you follow these suggestions, your engorgement should improve in 24 to 48 hours. If you are still experiencing difficulty, call your lactation consultant or caregiver. IS MY BABY GETTING ENOUGH MILK? Sometimes, mothers worry about whether their babies are getting enough milk. You can be assured that your baby is getting enough milk if:  The baby is actively sucking and you hear swallowing.   The baby nurses at least 8 to 12 times in a 24 hour time period. Nurse your baby until he or she unlatches or falls asleep at the first breast (at least 10 to 20 minutes), then offer the second side.   The baby is wetting 5 to 6 disposable diapers (6 to 8 cloth diapers) in a 24 hour period by 5 to 6 days of age.   The baby is having at least 2 to 3 stools every 24 hours for the first few months. Breast milk is all the food your baby needs. It is not necessary for your baby to have water or formula. In fact, to help your breasts make more milk, it is best not to give your baby supplemental feedings during the early weeks.   The stool should be soft and yellow.   The baby should gain 4 to 7 ounces per week after he is 4 days old.  TAKE CARE OF YOURSELF Take care of your breasts by:  Bathing or showering daily.   Avoiding the use of soaps on your nipples.   Start feedings on your left breast at one feeding and on your right breast at the next feeding.   You will notice an increase in your milk supply 2 to 5 days after delivery. You may feel some discomfort from engorgement, which makes your breasts very firm and often tender. Engorgement "peaks" out within 24 to 48 hours. In the meantime, apply warm moist towels to your breasts for 5 to 10 minutes before feeding. Gentle massage and expression of some milk before feeding will soften your breasts, making  it easier for your baby to latch on. Wear a well fitting nursing bra and air dry your nipples for 10 to 15 minutes after each feeding.   Only use cotton bra pads.   Only use pure lanolin on your nipples after nursing. You do not need to wash it off before nursing.  Take care of yourself by:   Eating well-balanced meals and nutritious snacks.   Drinking milk, fruit juice, and water to satisfy your thirst (about 8 glasses a day).   Getting plenty of rest.   Increasing calcium in   your diet (1200 mg a day).   Avoiding foods that you notice affect the baby in a bad way.  SEEK MEDICAL CARE IF:   You have any questions or difficulty with breastfeeding.   You need help.   You have a hard, red, sore area on your breast, accompanied by a fever of 100.5 F (38.1 C) or more.   Your baby is too sleepy to eat well or is having trouble sleeping.   Your baby is wetting less than 6 diapers per day, by 5 days of age.   Your baby's skin or white part of his or her eyes is more yellow than it was in the hospital.   You feel depressed.  Document Released: 03/06/2005 Document Revised: 02/23/2011 Document Reviewed: 10/19/2008 [employer] Patient Information 2012 [employer]. 

## 2011-11-07 ENCOUNTER — Ambulatory Visit (INDEPENDENT_AMBULATORY_CARE_PROVIDER_SITE_OTHER): Payer: Medicaid Other | Admitting: Family Medicine

## 2011-11-07 ENCOUNTER — Encounter: Payer: Self-pay | Admitting: Family Medicine

## 2011-11-07 ENCOUNTER — Other Ambulatory Visit (HOSPITAL_COMMUNITY)
Admission: RE | Admit: 2011-11-07 | Discharge: 2011-11-07 | Disposition: A | Discharge status: Home / Self Care | Payer: Medicaid Other | Source: Ambulatory Visit | Attending: Family Medicine | Admitting: Family Medicine

## 2011-11-07 DIAGNOSIS — Z01419 Encounter for gynecological examination (general) (routine) without abnormal findings: Secondary | ICD-10-CM | POA: Insufficient documentation

## 2011-11-07 NOTE — Patient Instructions (Signed)

## 2011-11-07 NOTE — Progress Notes (Signed)
  Subjective:     [name] is a 26 y.o. female who presents for a postpartum visit. She is 6 weeks postpartum following a low cervical transverse Cesarean section. I have fully reviewed the prenatal and intrapartum course. The delivery was at 39 gestational weeks. Outcome: primary cesarean section, low transverse incision. Anesthesia: spinal. Postpartum course has been normal. Baby's course has been normal. Baby is feeding by bottle - Enfamil with Iron. Bleeding staining only. Bowel function is normal. Bladder function is normal. Patient is sexually active, protected. Contraception method is none. Postpartum depression screening: negative.  The following portions of the patient's history were reviewed and updated as appropriate: allergies, current medications, past family history, past medical history, past social history, past surgical history and problem list.  Review of Systems A comprehensive review of systems was negative.   Objective:    BP 154/97  Pulse 60  Ht 5\' 7"  (1.702 m)  Wt 161 lb (73.029 kg)  BMI 25.22 kg/m2  Breastfeeding? No  General:  alert, cooperative and appears stated age  Skin: Incision well healed  Lungs: clear to auscultation bilaterally  Heart:  regular rate and rhythm, S1, S2 normal, no murmur, click, rub or gallop  Abdomen: soft, non-tender; bowel sounds normal; no masses,  no organomegaly   Vulva:  normal  Vagina: normal vagina  Cervix:  multiparous appearance  Corpus: normal size, contour, position, consistency, mobility, non-tender  Adnexa:  normal adnexa and no mass, fullness, tenderness  Rectal Exam: Not performed.        Assessment:    Normal postpartum exam. Pap smear done at today's visit.   Plan:    1. Contraception: IUD 2. To return for insertion on 8/27.  3. Follow up in: 1 week or as needed.

## 2011-11-07 NOTE — Progress Notes (Signed)
Patient is her for post partum check today, she is doing well and would like to have her pap today as well, as she is due in August and scheduled next week for IUD.

## 2011-11-14 ENCOUNTER — Ambulatory Visit (INDEPENDENT_AMBULATORY_CARE_PROVIDER_SITE_OTHER): Payer: Medicaid Other | Admitting: Family Medicine

## 2011-11-14 DIAGNOSIS — Z01812 Encounter for preprocedural laboratory examination: Secondary | ICD-10-CM

## 2011-11-14 DIAGNOSIS — Z30430 Encounter for insertion of intrauterine contraceptive device: Secondary | ICD-10-CM

## 2011-11-14 NOTE — Progress Notes (Signed)
Patient identified, informed consent performed, signed copy in chart, time out was performed.  Urine pregnancy test negative.  Speculum placed in the vagina.  Cervix visualized.  Cleaned with Betadine x 2.  Grasped anteriourly with a single tooth tenaculum.  Uterus sounded to 8 cm.  Mirena IUD placed per manufacturer's recommendations.  Strings trimmed to 3 cm.   Patient given post procedure instructions and Mirena care card with expiration date.  Patient is asked to check IUD strings periodically.

## 2011-11-14 NOTE — Patient Instructions (Signed)

## 2012-04-22 ENCOUNTER — Encounter: Payer: Self-pay | Admitting: *Deleted

## 2012-04-22 ENCOUNTER — Ambulatory Visit (INDEPENDENT_AMBULATORY_CARE_PROVIDER_SITE_OTHER): Payer: 59 | Admitting: *Deleted

## 2012-04-22 VITALS — BP 122/66 | Wt 163.0 lb

## 2012-04-22 DIAGNOSIS — Z3480 Encounter for supervision of other normal pregnancy, unspecified trimester: Secondary | ICD-10-CM

## 2012-04-22 NOTE — Addendum Note (Signed)
Addended by: Barbara Cower on: 04/22/2012 12:09 PM   Modules accepted: Orders

## 2012-04-22 NOTE — Progress Notes (Signed)
Patient is interested in first trimester screening this time, she would like to also talk about v-bac delivery.  She had IUD removed in September and took ocps for 3 months and then stopped the pill for one month and conceived.

## 2012-04-23 LAB — OBSTETRIC PANEL
Antibody Screen: NEGATIVE
Eosinophils Relative: 1 % (ref 0–5)
HCT: 35.4 % — ABNORMAL LOW (ref 36.0–46.0)
Hemoglobin: 12 g/dL (ref 12.0–15.0)
Lymphocytes Relative: 35 % (ref 12–46)
MCV: 81.2 fL (ref 78.0–100.0)
Monocytes Absolute: 0.4 10*3/uL (ref 0.1–1.0)
Monocytes Relative: 7 % (ref 3–12)
Neutro Abs: 3 10*3/uL (ref 1.7–7.7)
RDW: 16.2 % — ABNORMAL HIGH (ref 11.5–15.5)
Rh Type: NEGATIVE
Rubella: 12 Index — ABNORMAL HIGH (ref <0.90)
WBC: 5.3 10*3/uL (ref 4.0–10.5)

## 2012-04-23 LAB — HIV ANTIBODY (ROUTINE TESTING W REFLEX): HIV: NONREACTIVE

## 2012-05-04 ENCOUNTER — Other Ambulatory Visit: Payer: Self-pay

## 2012-05-07 ENCOUNTER — Encounter: Payer: Self-pay | Admitting: Family Medicine

## 2012-05-07 ENCOUNTER — Ambulatory Visit (INDEPENDENT_AMBULATORY_CARE_PROVIDER_SITE_OTHER): Payer: Medicaid Other | Admitting: Family Medicine

## 2012-05-07 VITALS — BP 107/70 | Wt 162.0 lb

## 2012-05-07 DIAGNOSIS — O34219 Maternal care for unspecified type scar from previous cesarean delivery: Secondary | ICD-10-CM

## 2012-05-07 DIAGNOSIS — Z6791 Unspecified blood type, Rh negative: Secondary | ICD-10-CM | POA: Insufficient documentation

## 2012-05-07 DIAGNOSIS — O360990 Maternal care for other rhesus isoimmunization, unspecified trimester, not applicable or unspecified: Secondary | ICD-10-CM

## 2012-05-07 DIAGNOSIS — Z3480 Encounter for supervision of other normal pregnancy, unspecified trimester: Secondary | ICD-10-CM | POA: Insufficient documentation

## 2012-05-07 DIAGNOSIS — O26899 Other specified pregnancy related conditions, unspecified trimester: Secondary | ICD-10-CM | POA: Insufficient documentation

## 2012-05-07 DIAGNOSIS — R8271 Bacteriuria: Secondary | ICD-10-CM

## 2012-05-07 DIAGNOSIS — M549 Dorsalgia, unspecified: Secondary | ICD-10-CM

## 2012-05-07 LAB — POCT URINALYSIS DIPSTICK
Bilirubin, UA: NEGATIVE
Blood, UA: NEGATIVE
Glucose, UA: NEGATIVE
Ketones, UA: NEGATIVE
Nitrite, UA: NEGATIVE
Spec Grav, UA: >=1.03
pH, UA: 6

## 2012-05-07 MED ORDER — AMOXICILLIN 500 MG PO CAPS: 500.0000 mg | ORAL_CAPSULE | Freq: Three times a day (TID) | ORAL | Status: DC

## 2012-05-07 MED ORDER — CONCEPT DHA 53.5-38-1 MG PO CAPS: 1.0000 | ORAL_CAPSULE | Freq: Every day | ORAL | Status: DC

## 2012-05-07 NOTE — Progress Notes (Signed)
   Subjective:    [name] is a [id number] [redacted]w[redacted]d being seen today for her first obstetrical visit.  Her obstetrical history is significant for large for gestational age and short interval between births and previous C-section. Patient does intend to breast feed. Pregnancy history fully reviewed.  Patient reports no complaints.  Filed Vitals:   05/07/12 0900  BP: 107/70  Weight: 162 lb (73.483 kg)    HISTORY: OB History   Grav Para Term Preterm Abortions TAB SAB Ect Mult Living   3 2 2  0 0 0 0 0 0 2     # Outc Date GA Lbr Len/2nd Wgt Sex Del Anes PTL Lv   1 TRM 5/12 [redacted]w[redacted]d  6lb14oz(3.118kg) M SVD EPI No Yes   2 TRM 7/13 [redacted]w[redacted]d 00:00 6lb8.4oz(2.96kg) F LTCS Spinal  Yes   3 CUR              Past Medical History  Diagnosis Date  . Rh negative state in antepartum period   . Abnormal Pap smear   . No pertinent past medical history    Past Surgical History  Procedure Laterality Date  . Tonsillectomy      27 yrs old  . Cesarean section  10/07/2011    Procedure: CESAREAN SECTION;  Surgeon: Willodean Rosenthal, MD;  Location: [facility];  Service: Obstetrics;  Laterality: N/A;   Family History  Problem Relation Age of Onset  . Diabetes Maternal Grandmother   . Diabetes Maternal Grandfather   . Anesthesia problems Neg Hx   . Hypotension Neg Hx   . Malignant hyperthermia Neg Hx   . Pseudochol deficiency Neg Hx      Exam    Uterus:     Pelvic Exam:                               System:     Skin: normal coloration and turgor, no rashes    Neurologic: oriented   Extremities: normal strength, tone, and muscle mass   HEENT sclera clear, anicteric   Mouth/Teeth mucous membranes moist, pharynx normal without lesions   Neck supple   Cardiovascular: regular rate and rhythm, no murmurs or gallops   Respiratory:  appears well, vitals normal, no respiratory distress, acyanotic, normal RR, ear and throat exam is normal, neck free of mass or lymphadenopathy, chest clear, no  wheezing, crepitations, rhonchi, normal symmetric air entry   Abdomen: soft, non-tender; bowel sounds normal; no masses,  no organomegaly          Assessment:    Pregnancy: [id number] Patient Active Problem List  Diagnosis  . Migraine headache without aura  . Anxiety  . Previous cesarean delivery, antepartum condition or complication  . Supervision of other normal pregnancy  . Rh negative status during pregnancy, antepartum  . Asymptomatic bacteriuria, antepartum        Plan:     Initial labs drawn. Prenatal vitamins. Problem list reviewed and updated. Genetic Screening discussed First Screen: ordered.  Ultrasound discussed; fetal survey: discussed.  Follow up in 4 weeks. Abx for ASB--enterococcus.     Melrose Kearse S 05/07/2012

## 2012-05-07 NOTE — Patient Instructions (Signed)
Pregnancy - First Trimester During sexual intercourse, millions of sperm go into the vagina. Only 1 sperm will penetrate and fertilize the female egg while it is in the Fallopian tube. One week later, the fertilized egg implants into the wall of the uterus. An embryo begins to develop into a baby. At 6 to 8 weeks, the eyes and face are formed and the heartbeat can be seen on ultrasound. At the end of 12 weeks (first trimester), all the baby's organs are formed. Now that you are pregnant, you will want to do everything you can to have a healthy baby. Two of the most important things are to get good prenatal care and follow your caregiver's instructions. Prenatal care is all the medical care you receive before the baby's birth. It is given to prevent, find, and treat problems during the pregnancy and childbirth. PRENATAL EXAMS  During prenatal visits, your weight, blood pressure and urine are checked. This is done to make sure you are healthy and progressing normally during the pregnancy.  A pregnant woman should gain 25 to 35 pounds during the pregnancy. However, if you are over weight or underweight, your caregiver will advise you regarding your weight.  Your caregiver will ask and answer questions for you.  Blood work, cervical cultures, other necessary tests and a Pap test are done during your prenatal exams. These tests are done to check on your health and the probable health of your baby. Tests are strongly recommended and done for HIV with your permission. This is the virus that causes AIDS. These tests are done because medications can be given to help prevent your baby from being born with this infection should you have been infected without knowing it. Blood work is also used to find out your blood type, previous infections and follow your blood levels (hemoglobin).  Low hemoglobin (anemia) is common during pregnancy. Iron and vitamins are given to help prevent this. Later in the pregnancy, blood  tests for diabetes will be done along with any other tests if any problems develop. You may need tests to make sure you and the baby are doing well.  You may need other tests to make sure you and the baby are doing well. CHANGES DURING THE FIRST TRIMESTER (THE FIRST 3 MONTHS OF PREGNANCY) Your body goes through many changes during pregnancy. They vary from person to person. Talk to your caregiver about changes you notice and are concerned about. Changes can include:  Your menstrual period stops.  The egg and sperm carry the genes that determine what you look like. Genes from you and your partner are forming a baby. The female genes determine whether the baby is a boy or a girl.  Your body increases in girth and you may feel bloated.  Feeling sick to your stomach (nauseous) and throwing up (vomiting). If the vomiting is uncontrollable, call your caregiver.  Your breasts will begin to enlarge and become tender.  Your nipples may stick out more and become darker.  The need to urinate more. Painful urination may mean you have a bladder infection.  Tiring easily.  Loss of appetite.  Cravings for certain kinds of food.  At first, you may gain or lose a couple of pounds.  You may have changes in your emotions from day to day (excited to be pregnant or concerned something may go wrong with the pregnancy and baby).  You may have more vivid and strange dreams. HOME CARE INSTRUCTIONS   It is very important   to avoid all smoking, alcohol and un-prescribed drugs during your pregnancy. These affect the formation and growth of the baby. Avoid chemicals while pregnant to ensure the delivery of a healthy infant.  Start your prenatal visits by the 12th week of pregnancy. They are usually scheduled monthly at first, then more often in the last 2 months before delivery. Keep your caregiver's appointments. Follow your caregiver's instructions regarding medication use, blood and lab tests, exercise, and  diet.  During pregnancy, you are providing food for you and your baby. Eat regular, well-balanced meals. Choose foods such as meat, fish, milk and other low fat dairy products, vegetables, fruits, and whole-grain breads and cereals. Your caregiver will tell you of the ideal weight gain.  You can help morning sickness by keeping soda crackers at the bedside. Eat a couple before arising in the morning. You may want to use the crackers without salt on them.  Eating 4 to 5 small meals rather than 3 large meals a day also may help the nausea and vomiting.  Drinking liquids between meals instead of during meals also seems to help nausea and vomiting.  A physical sexual relationship may be continued throughout pregnancy if there are no other problems. Problems may be early (premature) leaking of amniotic fluid from the membranes, vaginal bleeding, or belly (abdominal) pain.  Exercise regularly if there are no restrictions. Check with your caregiver or physical therapist if you are unsure of the safety of some of your exercises. Greater weight gain will occur in the last 2 trimesters of pregnancy. Exercising will help:  Control your weight.  Keep you in shape.  Prepare you for labor and delivery.  Help you lose your pregnancy weight after you deliver your baby.  Wear a good support or jogging bra for breast tenderness during pregnancy. This may help if worn during sleep too.  Ask when prenatal classes are available. Begin classes when they are offered.  Do not use hot tubs, steam rooms or saunas.  Wear your seat belt when driving. This protects you and your baby if you are in an accident.  Avoid raw meat, uncooked cheese, cat litter boxes and soil used by cats throughout the pregnancy. These carry germs that can cause birth defects in the baby.  The first trimester is a good time to visit your dentist for your dental health. Getting your teeth cleaned is OK. Use a softer toothbrush and brush  gently during pregnancy.  Ask for help if you have financial, counseling or nutritional needs during pregnancy. Your caregiver will be able to offer counseling for these needs as well as refer you for other special needs.  Do not take any medications or herbs unless told by your caregiver.  Inform your caregiver if there is any mental or physical domestic violence.  Make a list of emergency phone numbers of family, friends, hospital, and police and fire departments.  Write down your questions. Take them to your prenatal visit.  Do not douche.  Do not cross your legs.  If you have to stand for long periods of time, rotate you feet or take small steps in a circle.  You may have more vaginal secretions that may require a sanitary pad. Do not use tampons or scented sanitary pads. MEDICATIONS AND DRUG USE IN PREGNANCY  Take prenatal vitamins as directed. The vitamin should contain 1 milligram of folic acid. Keep all vitamins out of reach of children. Only a couple vitamins or tablets containing iron may be   fatal to a baby or young child when ingested.  Avoid use of all medications, including herbs, over-the-counter medications, not prescribed or suggested by your caregiver. Only take over-the-counter or prescription medicines for pain, discomfort, or fever as directed by your caregiver. Do not use aspirin, ibuprofen, or naproxen unless directed by your caregiver.  Let your caregiver also know about herbs you may be using.  Alcohol is related to a number of birth defects. This includes fetal alcohol syndrome. All alcohol, in any form, should be avoided completely. Smoking will cause low birth rate and premature babies.  Street or illegal drugs are very harmful to the baby. They are absolutely forbidden. A baby born to an addicted mother will be addicted at birth. The baby will go through the same withdrawal an adult does.  Let your caregiver know about any medications that you have to take  and for what reason you take them. MISCARRIAGE IS COMMON DURING PREGNANCY A miscarriage does not mean you did something wrong. It is not a reason to worry about getting pregnant again. Your caregiver will help you with questions you may have. If you have a miscarriage, you may need minor surgery. SEEK MEDICAL CARE IF:  You have any concerns or worries during your pregnancy. It is better to call with your questions if you feel they cannot wait, rather than worry about them. SEEK IMMEDIATE MEDICAL CARE IF:   An unexplained oral temperature above 102 F (38.9 C) develops, or as your caregiver suggests.  You have leaking of fluid from the vagina (birth canal). If leaking membranes are suspected, take your temperature and inform your caregiver of this when you call.  There is vaginal spotting or bleeding. Notify your caregiver of the amount and how many pads are used.  You develop a bad smelling vaginal discharge with a change in the color.  You continue to feel sick to your stomach (nauseated) and have no relief from remedies suggested. You vomit blood or coffee ground-like materials.  You lose more than 2 pounds of weight in 1 week.  You gain more than 2 pounds of weight in 1 week and you notice swelling of your face, hands, feet, or legs.  You gain 5 pounds or more in 1 week (even if you do not have swelling of your hands, face, legs, or feet).  You get exposed to German measles and have never had them.  You are exposed to fifth disease or chickenpox.  You develop belly (abdominal) pain. Round ligament discomfort is a common non-cancerous (benign) cause of abdominal pain in pregnancy. Your caregiver still must evaluate this.  You develop headache, fever, diarrhea, pain with urination, or shortness of breath.  You fall or are in a car accident or have any kind of trauma.  There is mental or physical violence in your home. Document Released: 02/28/2001 Document Revised: 05/29/2011  Document Reviewed: 09/01/2008 [employer] Patient Information 2013 [employer].  

## 2012-06-04 ENCOUNTER — Ambulatory Visit (INDEPENDENT_AMBULATORY_CARE_PROVIDER_SITE_OTHER): Payer: Medicaid Other | Admitting: Family Medicine

## 2012-06-04 ENCOUNTER — Encounter: Payer: Self-pay | Admitting: Family Medicine

## 2012-06-04 VITALS — BP 118/80 | Wt 161.0 lb

## 2012-06-04 DIAGNOSIS — Z3480 Encounter for supervision of other normal pregnancy, unspecified trimester: Secondary | ICD-10-CM

## 2012-06-04 NOTE — Progress Notes (Signed)
Doing well-- Order first screen

## 2012-06-04 NOTE — Patient Instructions (Addendum)
Pregnancy - First Trimester During sexual intercourse, millions of sperm go into the vagina. Only 1 sperm will penetrate and fertilize the female egg while it is in the Fallopian tube. One week later, the fertilized egg implants into the wall of the uterus. An embryo begins to develop into a baby. At 6 to 8 weeks, the eyes and face are formed and the heartbeat can be seen on ultrasound. At the end of 12 weeks (first trimester), all the baby's organs are formed. Now that you are pregnant, you will want to do everything you can to have a healthy baby. Two of the most important things are to get good prenatal care and follow your caregiver's instructions. Prenatal care is all the medical care you receive before the baby's birth. It is given to prevent, find, and treat problems during the pregnancy and childbirth. PRENATAL EXAMS  During prenatal visits, your weight, blood pressure and urine are checked. This is done to make sure you are healthy and progressing normally during the pregnancy.  A pregnant woman should gain 25 to 35 pounds during the pregnancy. However, if you are over weight or underweight, your caregiver will advise you regarding your weight.  Your caregiver will ask and answer questions for you.  Blood work, cervical cultures, other necessary tests and a Pap test are done during your prenatal exams. These tests are done to check on your health and the probable health of your baby. Tests are strongly recommended and done for HIV with your permission. This is the virus that causes AIDS. These tests are done because medications can be given to help prevent your baby from being born with this infection should you have been infected without knowing it. Blood work is also used to find out your blood type, previous infections and follow your blood levels (hemoglobin).  Low hemoglobin (anemia) is common during pregnancy. Iron and vitamins are given to help prevent this. Later in the pregnancy,  blood tests for diabetes will be done along with any other tests if any problems develop. You may need tests to make sure you and the baby are doing well.  You may need other tests to make sure you and the baby are doing well. CHANGES DURING THE FIRST TRIMESTER (THE FIRST 3 MONTHS OF PREGNANCY) Your body goes through many changes during pregnancy. They vary from person to person. Talk to your caregiver about changes you notice and are concerned about. Changes can include:  Your menstrual period stops.  The egg and sperm carry the genes that determine what you look like. Genes from you and your partner are forming a baby. The female genes determine whether the baby is a boy or a girl.  Your body increases in girth and you may feel bloated.  Feeling sick to your stomach (nauseous) and throwing up (vomiting). If the vomiting is uncontrollable, call your caregiver.  Your breasts will begin to enlarge and become tender.  Your nipples may stick out more and become darker.  The need to urinate more. Painful urination may mean you have a bladder infection.  Tiring easily.  Loss of appetite.  Cravings for certain kinds of food.  At first, you may gain or lose a couple of pounds.  You may have changes in your emotions from day to day (excited to be pregnant or concerned something may go wrong with the pregnancy and baby).  You may have more vivid and strange dreams. HOME CARE INSTRUCTIONS   It is very important   to avoid all smoking, alcohol and un-prescribed drugs during your pregnancy. These affect the formation and growth of the baby. Avoid chemicals while pregnant to ensure the delivery of a healthy infant.  Start your prenatal visits by the 12th week of pregnancy. They are usually scheduled monthly at first, then more often in the last 2 months before delivery. Keep your caregiver's appointments. Follow your caregiver's instructions regarding medication use, blood and lab tests, exercise,  and diet.  During pregnancy, you are providing food for you and your baby. Eat regular, well-balanced meals. Choose foods such as meat, fish, milk and other low fat dairy products, vegetables, fruits, and whole-grain breads and cereals. Your caregiver will tell you of the ideal weight gain.  You can help morning sickness by keeping soda crackers at the bedside. Eat a couple before arising in the morning. You may want to use the crackers without salt on them.  Eating 4 to 5 small meals rather than 3 large meals a day also may help the nausea and vomiting.  Drinking liquids between meals instead of during meals also seems to help nausea and vomiting.  A physical sexual relationship may be continued throughout pregnancy if there are no other problems. Problems may be early (premature) leaking of amniotic fluid from the membranes, vaginal bleeding, or belly (abdominal) pain.  Exercise regularly if there are no restrictions. Check with your caregiver or physical therapist if you are unsure of the safety of some of your exercises. Greater weight gain will occur in the last 2 trimesters of pregnancy. Exercising will help:  Control your weight.  Keep you in shape.  Prepare you for labor and delivery.  Help you lose your pregnancy weight after you deliver your baby.  Wear a good support or jogging bra for breast tenderness during pregnancy. This may help if worn during sleep too.  Ask when prenatal classes are available. Begin classes when they are offered.  Do not use hot tubs, steam rooms or saunas.  Wear your seat belt when driving. This protects you and your baby if you are in an accident.  Avoid raw meat, uncooked cheese, cat litter boxes and soil used by cats throughout the pregnancy. These carry germs that can cause birth defects in the baby.  The first trimester is a good time to visit your dentist for your dental health. Getting your teeth cleaned is OK. Use a softer toothbrush and  brush gently during pregnancy.  Ask for help if you have financial, counseling or nutritional needs during pregnancy. Your caregiver will be able to offer counseling for these needs as well as refer you for other special needs.  Do not take any medications or herbs unless told by your caregiver.  Inform your caregiver if there is any mental or physical domestic violence.  Make a list of emergency phone numbers of family, friends, hospital, and police and fire departments.  Write down your questions. Take them to your prenatal visit.  Do not douche.  Do not cross your legs.  If you have to stand for long periods of time, rotate you feet or take small steps in a circle.  You may have more vaginal secretions that may require a sanitary pad. Do not use tampons or scented sanitary pads. MEDICATIONS AND DRUG USE IN PREGNANCY  Take prenatal vitamins as directed. The vitamin should contain 1 milligram of folic acid. Keep all vitamins out of reach of children. Only a couple vitamins or tablets containing iron may be   fatal to a baby or young child when ingested.  Avoid use of all medications, including herbs, over-the-counter medications, not prescribed or suggested by your caregiver. Only take over-the-counter or prescription medicines for pain, discomfort, or fever as directed by your caregiver. Do not use aspirin, ibuprofen, or naproxen unless directed by your caregiver.  Let your caregiver also know about herbs you may be using.  Alcohol is related to a number of birth defects. This includes fetal alcohol syndrome. All alcohol, in any form, should be avoided completely. Smoking will cause low birth rate and premature babies.  Street or illegal drugs are very harmful to the baby. They are absolutely forbidden. A baby born to an addicted mother will be addicted at birth. The baby will go through the same withdrawal an adult does.  Let your caregiver know about any medications that you have to  take and for what reason you take them. MISCARRIAGE IS COMMON DURING PREGNANCY A miscarriage does not mean you did something wrong. It is not a reason to worry about getting pregnant again. Your caregiver will help you with questions you may have. If you have a miscarriage, you may need minor surgery. SEEK MEDICAL CARE IF:  You have any concerns or worries during your pregnancy. It is better to call with your questions if you feel they cannot wait, rather than worry about them. SEEK IMMEDIATE MEDICAL CARE IF:   An unexplained oral temperature above 102 F (38.9 C) develops, or as your caregiver suggests.  You have leaking of fluid from the vagina (birth canal). If leaking membranes are suspected, take your temperature and inform your caregiver of this when you call.  There is vaginal spotting or bleeding. Notify your caregiver of the amount and how many pads are used.  You develop a bad smelling vaginal discharge with a change in the color.  You continue to feel sick to your stomach (nauseated) and have no relief from remedies suggested. You vomit blood or coffee ground-like materials.  You lose more than 2 pounds of weight in 1 week.  You gain more than 2 pounds of weight in 1 week and you notice swelling of your face, hands, feet, or legs.  You gain 5 pounds or more in 1 week (even if you do not have swelling of your hands, face, legs, or feet).  You get exposed to German measles and have never had them.  You are exposed to fifth disease or chickenpox.  You develop belly (abdominal) pain. Round ligament discomfort is a common non-cancerous (benign) cause of abdominal pain in pregnancy. Your caregiver still must evaluate this.  You develop headache, fever, diarrhea, pain with urination, or shortness of breath.  You fall or are in a car accident or have any kind of trauma.  There is mental or physical violence in your home. Document Released: 02/28/2001 Document Revised: 05/29/2011  Document Reviewed: 09/01/2008 [employer] Patient Information 2013 [employer].  Breastfeeding Deciding to breastfeed is one of the best choices you can make for you and your baby. The information that follows gives a brief overview of the benefits of breastfeeding as well as common topics surrounding breastfeeding. BENEFITS OF BREASTFEEDING For the baby  The first milk (colostrum) helps the baby's digestive system function better.   There are antibodies in the mother's milk that help the baby fight off infections.   The baby has a lower incidence of asthma, allergies, and sudden infant death syndrome (SIDS).   The nutrients in breast milk   are better for the baby than infant formulas, and breast milk helps the baby's brain grow better.   Babies who breastfeed have less gas, colic, and constipation.  For the mother  Breastfeeding helps develop a very special bond between the mother and her baby.   Breastfeeding is convenient, always available at the correct temperature, and costs nothing.   Breastfeeding burns calories in the mother and helps her lose weight that was gained during pregnancy.   Breastfeeding makes the uterus contract back down to normal size faster and slows bleeding following delivery.   Breastfeeding mothers have a lower risk of developing breast cancer.  BREASTFEEDING FREQUENCY  A healthy, full-term baby may breastfeed as often as every hour or space his or her feedings to every 3 hours.   Watch your baby for signs of hunger. Nurse your baby if he or she shows signs of hunger. How often you nurse will vary from baby to baby.   Nurse as often as the baby requests, or when you feel the need to reduce the fullness of your breasts.   Awaken the baby if it has been 3 4 hours since the last feeding.   Frequent feeding will help the mother make more milk and will help prevent problems, such as sore nipples and engorgement of the breasts.  BABY'S  POSITION AT THE BREAST  Whether lying down or sitting, be sure that the baby's tummy is facing your tummy.   Support the breast with 4 fingers underneath the breast and the thumb above. Make sure your fingers are well away from the nipple and baby's mouth.   Stroke the baby's lips gently with your finger or nipple.   When the baby's mouth is open wide enough, place all of your nipple and as much of the areola as possible into your baby's mouth.   Pull the baby in close so the tip of the nose and the baby's cheeks touch the breast during the feeding.  FEEDINGS AND SUCTION  The length of each feeding varies from baby to baby and from feeding to feeding.   The baby must suck about 2 3 minutes for your milk to get to him or her. This is called a "let down." For this reason, allow the baby to feed on each breast as long as he or she wants. Your baby will end the feeding when he or she has received the right balance of nutrients.   To break the suction, put your finger into the corner of the baby's mouth and slide it between his or her gums before removing your breast from his or her mouth. This will help prevent sore nipples.  HOW TO TELL WHETHER YOUR BABY IS GETTING ENOUGH BREAST MILK. Wondering whether or not your baby is getting enough milk is a common concern among mothers. You can be assured that your baby is getting enough milk if:   Your baby is actively sucking and you hear swallowing.   Your baby seems relaxed and satisfied after a feeding.   Your baby nurses at least 8 12 times in a 24 hour time period. Nurse your baby until he or she unlatches or falls asleep at the first breast (at least 10 20 minutes), then offer the second side.   Your baby is wetting 5 6 disposable diapers (6 8 cloth diapers) in a 24 hour period by 5 6 days of age.   Your baby is having at least 3 4 stools every 24 hours   for the first 6 weeks. The stool should be soft and yellow.   Your baby  should gain 4 7 ounces per week after he or she is 4 days old.   Your breasts feel softer after nursing.  REDUCING BREAST ENGORGEMENT  In the first week after your baby is born, you may experience signs of breast engorgement. When breasts are engorged, they feel heavy, warm, full, and may be tender to the touch. You can reduce engorgement if you:   Nurse frequently, every 2 3 hours. Mothers who breastfeed early and often have fewer problems with engorgement.   Place light ice packs on your breasts for 10 20 minutes between feedings. This reduces swelling. Wrap the ice packs in a lightweight towel to protect your skin. Bags of frozen vegetables work well for this purpose.   Take a warm shower or apply warm, moist heat to your breast for 5 10 minutes just before each feeding. This increases circulation and helps the milk flow.   Gently massage your breast before and during the feeding. Using your finger tips, massage from the chest wall towards your nipple in a circular motion.   Make sure that the baby empties at least one breast at every feeding before switching sides.   Use a breast pump to empty the breasts if your baby is sleepy or not nursing well. You may also want to pump if you are returning to work oryou feel you are getting engorged.   Avoid bottle feeds, pacifiers, or supplemental feedings of water or juice in place of breastfeeding. Breast milk is all the food your baby needs. It is not necessary for your baby to have water or formula. In fact, to help your breasts make more milk, it is best not to give your baby supplemental feedings during the early weeks.   Be sure the baby is latched on and positioned properly while breastfeeding.   Wear a supportive bra, avoiding underwire styles.   Eat a balanced diet with enough fluids.   Rest often, relax, and take your prenatal vitamins to prevent fatigue, stress, and anemia.  If you follow these suggestions, your  engorgement should improve in 24 48 hours. If you are still experiencing difficulty, call your lactation consultant or caregiver.  CARING FOR YOURSELF Take care of your breasts  Bathe or shower daily.   Avoid using soap on your nipples.   Start feedings on your left breast at one feeding and on your right breast at the next feeding.   You will notice an increase in your milk supply 2 5 days after delivery. You may feel some discomfort from engorgement, which makes your breasts very firm and often tender. Engorgement "peaks" out within 24 48 hours. In the meantime, apply warm moist towels to your breasts for 5 10 minutes before feeding. Gentle massage and expression of some milk before feeding will soften your breasts, making it easier for your baby to latch on.   Wear a well-fitting nursing bra, and air dry your nipples for a 3 4minutes after each feeding.   Only use cotton bra pads.   Only use pure lanolin on your nipples after nursing. You do not need to wash it off before feeding the baby again. Another option is to express a few drops of breast milk and gently massage it into your nipples.  Take care of yourself  Eat well-balanced meals and nutritious snacks.   Drinking milk, fruit juice, and water to satisfy your thirst (  about 8 glasses a day).   Get plenty of rest.  Avoid foods that you notice affect the baby in a bad way.  SEEK MEDICAL CARE IF:   You have difficulty with breastfeeding and need help.   You have a hard, red, sore area on your breast that is accompanied by a fever.   Your baby is too sleepy to eat well or is having trouble sleeping.   Your baby is wetting less than 6 diapers a day, by 5 days of age.   Your baby's skin or white part of his or her eyes is more yellow than it was in the hospital.   You feel depressed.  Document Released: 03/06/2005 Document Revised: 09/05/2011 Document Reviewed: 06/04/2011 [employer] Patient Information 2013  [employer].  

## 2012-06-21 ENCOUNTER — Ambulatory Visit (HOSPITAL_COMMUNITY)
Admission: RE | Admit: 2012-06-21 | Discharge: 2012-06-21 | Disposition: A | Discharge status: Home / Self Care | Payer: 59 | Source: Ambulatory Visit | Attending: Obstetrics & Gynecology | Admitting: Obstetrics & Gynecology

## 2012-06-21 ENCOUNTER — Other Ambulatory Visit: Payer: Self-pay

## 2012-06-21 ENCOUNTER — Encounter (HOSPITAL_COMMUNITY): Payer: Self-pay

## 2012-06-21 ENCOUNTER — Ambulatory Visit (HOSPITAL_COMMUNITY)
Admission: RE | Admit: 2012-06-21 | Discharge: 2012-06-21 | Disposition: A | Discharge status: Home / Self Care | Payer: 59 | Source: Ambulatory Visit | Attending: Family Medicine | Admitting: Family Medicine

## 2012-06-21 ENCOUNTER — Encounter: Payer: Self-pay | Admitting: Family Medicine

## 2012-06-21 DIAGNOSIS — Z3480 Encounter for supervision of other normal pregnancy, unspecified trimester: Secondary | ICD-10-CM

## 2012-06-21 DIAGNOSIS — O34219 Maternal care for unspecified type scar from previous cesarean delivery: Secondary | ICD-10-CM | POA: Insufficient documentation

## 2012-06-21 DIAGNOSIS — O3510X0 Maternal care for (suspected) chromosomal abnormality in fetus, unspecified, not applicable or unspecified: Secondary | ICD-10-CM | POA: Insufficient documentation

## 2012-06-21 DIAGNOSIS — O351XX0 Maternal care for (suspected) chromosomal abnormality in fetus, not applicable or unspecified: Secondary | ICD-10-CM | POA: Insufficient documentation

## 2012-06-21 DIAGNOSIS — Z3689 Encounter for other specified antenatal screening: Secondary | ICD-10-CM | POA: Insufficient documentation

## 2012-06-21 NOTE — Progress Notes (Signed)
[name]  was seen today for an ultrasound appointment.  See full report in [facility].  Impression: Single IUP at 13 1/7 weeks NT of 1.2 mm noted.  A nasal bone was visualized. First trimester aneuploidy screen performed as noted above.   Recommendations: Please do not draw triple/quad screen, though patient should be offered MSAFP for neural tube defect screening.  Recommend follow-up ultrasound examination in 6 weeks for anatomy.  Alpha Gula, MD

## 2012-06-28 ENCOUNTER — Ambulatory Visit (INDEPENDENT_AMBULATORY_CARE_PROVIDER_SITE_OTHER): Payer: Medicaid Other | Admitting: Obstetrics & Gynecology

## 2012-06-28 VITALS — BP 112/66 | Wt 155.0 lb

## 2012-06-28 DIAGNOSIS — Z3480 Encounter for supervision of other normal pregnancy, unspecified trimester: Secondary | ICD-10-CM

## 2012-06-28 DIAGNOSIS — Z3482 Encounter for supervision of other normal pregnancy, second trimester: Secondary | ICD-10-CM

## 2012-06-28 DIAGNOSIS — O34219 Maternal care for unspecified type scar from previous cesarean delivery: Secondary | ICD-10-CM

## 2012-06-28 DIAGNOSIS — Z302 Encounter for sterilization: Secondary | ICD-10-CM

## 2012-06-28 NOTE — Patient Instructions (Signed)
Return to clinic for any obstetric concerns or go to [facility] for evaluation  

## 2012-06-28 NOTE — Progress Notes (Signed)
P=[id number] 

## 2012-06-28 NOTE — Progress Notes (Signed)
Normal NT, serum testing pending.  MSAFP draw next visit.  Anatomy scan ordered.  No other complaints or concerns.  Routine obstetric precautions reviewed.  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

## 2012-07-25 ENCOUNTER — Encounter: Payer: Self-pay | Admitting: Obstetrics & Gynecology

## 2012-07-25 ENCOUNTER — Ambulatory Visit (INDEPENDENT_AMBULATORY_CARE_PROVIDER_SITE_OTHER): Payer: 59 | Admitting: Obstetrics & Gynecology

## 2012-07-25 VITALS — BP 110/68 | Wt 154.0 lb

## 2012-07-25 DIAGNOSIS — Z3482 Encounter for supervision of other normal pregnancy, second trimester: Secondary | ICD-10-CM

## 2012-07-25 DIAGNOSIS — O34219 Maternal care for unspecified type scar from previous cesarean delivery: Secondary | ICD-10-CM

## 2012-07-25 DIAGNOSIS — Z3480 Encounter for supervision of other normal pregnancy, unspecified trimester: Secondary | ICD-10-CM

## 2012-07-25 NOTE — Progress Notes (Signed)
Anatomy scan scheduled tomorrow.  AFP draw today.  No other complaints or concerns.  Routine obstetric precautions reviewed.

## 2012-07-25 NOTE — Patient Instructions (Signed)
Return to clinic for any obstetric concerns or go to [facility] for evaluation  

## 2012-07-26 ENCOUNTER — Encounter: Payer: Self-pay | Admitting: Obstetrics & Gynecology

## 2012-07-26 ENCOUNTER — Ambulatory Visit (HOSPITAL_COMMUNITY)
Admission: RE | Admit: 2012-07-26 | Discharge: 2012-07-26 | Disposition: A | Discharge status: Home / Self Care | Payer: 59 | Source: Ambulatory Visit | Attending: Obstetrics & Gynecology | Admitting: Obstetrics & Gynecology

## 2012-07-26 DIAGNOSIS — O4440 Low lying placenta NOS or without hemorrhage, unspecified trimester: Secondary | ICD-10-CM | POA: Insufficient documentation

## 2012-07-26 DIAGNOSIS — Z363 Encounter for antenatal screening for malformations: Secondary | ICD-10-CM | POA: Insufficient documentation

## 2012-07-26 DIAGNOSIS — O34219 Maternal care for unspecified type scar from previous cesarean delivery: Secondary | ICD-10-CM

## 2012-07-26 DIAGNOSIS — Z1389 Encounter for screening for other disorder: Secondary | ICD-10-CM | POA: Insufficient documentation

## 2012-07-26 DIAGNOSIS — O358XX0 Maternal care for other (suspected) fetal abnormality and damage, not applicable or unspecified: Secondary | ICD-10-CM | POA: Insufficient documentation

## 2012-07-26 DIAGNOSIS — Z3482 Encounter for supervision of other normal pregnancy, second trimester: Secondary | ICD-10-CM

## 2012-07-30 ENCOUNTER — Encounter: Payer: Self-pay | Admitting: Obstetrics & Gynecology

## 2012-07-30 LAB — ALPHA FETOPROTEIN, MATERNAL
AFP: 61.9 IU/mL
MoM for AFP: 1.57
Open Spina bifida: NEGATIVE
Osb Risk: 1:2270 {titer}

## 2012-08-27 ENCOUNTER — Ambulatory Visit (INDEPENDENT_AMBULATORY_CARE_PROVIDER_SITE_OTHER): Payer: 59 | Admitting: Family Medicine

## 2012-08-27 ENCOUNTER — Encounter: Payer: Self-pay | Admitting: Family Medicine

## 2012-08-27 VITALS — BP 103/66 | Wt 156.0 lb

## 2012-08-27 DIAGNOSIS — O34219 Maternal care for unspecified type scar from previous cesarean delivery: Secondary | ICD-10-CM

## 2012-08-27 DIAGNOSIS — O360990 Maternal care for other rhesus isoimmunization, unspecified trimester, not applicable or unspecified: Secondary | ICD-10-CM

## 2012-08-27 DIAGNOSIS — O360121 Maternal care for anti-D [Rh] antibodies, second trimester, fetus 1: Secondary | ICD-10-CM

## 2012-08-27 DIAGNOSIS — Z3480 Encounter for supervision of other normal pregnancy, unspecified trimester: Secondary | ICD-10-CM

## 2012-08-27 NOTE — Progress Notes (Signed)
TDaP, Rhogam and 28 wk labs next visit. Will need U/s scheduled at next visit.

## 2012-08-27 NOTE — Patient Instructions (Signed)
Breastfeeding A change in hormones during your pregnancy causes growth of your breast tissue and an increase in number and size of milk ducts. The hormone prolactin allows proteins, sugars, and fats from your blood supply to make breast milk in your milk-producing glands. The hormone progesterone prevents breast milk from being released before the birth of your baby. After the birth of your baby, your progesterone level decreases allowing breast milk to be released. Thoughts of your baby, as well as his or her sucking or crying, can stimulate the release of milk from the milk-producing glands. Deciding to breastfeed (nurse) is one of the best choices you can make for you and your baby. The information that follows gives a brief review of the benefits, as well as other important skills to know about breastfeeding. BENEFITS OF BREASTFEEDING For your baby  The first milk (colostrum) helps your baby's digestive system function better.   There are antibodies in your milk that help your baby fight off infections.   Your baby has a lower incidence of asthma, allergies, and sudden infant death syndrome (SIDS).   The nutrients in breast milk are better for your baby than infant formulas.  Breast milk improves your baby's brain development.   Your baby will have less gas, colic, and constipation.  Your baby is less likely to develop other conditions, such as childhood obesity, asthma, or diabetes mellitus. For you  Breastfeeding helps develop a very special bond between you and your baby.   Breastfeeding is convenient, always available at the correct temperature, and costs nothing.   Breastfeeding helps to burn calories and helps you lose the weight gained during pregnancy.   Breastfeeding makes your uterus contract back down to normal size faster and slows bleeding following delivery.   Breastfeeding mothers have a lower risk of developing osteoporosis or breast or ovarian cancer later  in life.  BREASTFEEDING FREQUENCY  A healthy, full-term baby may breastfeed as often as every hour or space his or her feedings to every 3 hours. Breastfeeding frequency will vary from baby to baby.   Newborns should be fed no less than every 2 3 hours during the day and every 4 5 hours during the night. You should breastfeed a minimum of 8 feedings in a 24 hour period.  Awaken your baby to breastfeed if it has been 3 4 hours since the last feeding.  Breastfeed when you feel the need to reduce the fullness of your breasts or when your newborn shows signs of hunger. Signs that your baby may be hungry include:  Increased alertness or activity.  Stretching.  Movement of the head from side to side.  Movement of the head and opening of the mouth when the corner of the mouth or cheek is stroked (rooting).  Increased sucking sounds, smacking lips, cooing, sighing, or squeaking.  Hand-to-mouth movements.  Increased sucking of fingers or hands.  Fussing.  Intermittent crying.  Signs of extreme hunger will require calming and consoling before you try to feed your baby. Signs of extreme hunger may include:  Restlessness.  A loud, strong cry.  Screaming.  Frequent feeding will help you make more milk and will help prevent problems, such as sore nipples and engorgement of the breasts.  BREASTFEEDING   Whether lying down or sitting, be sure that the baby's abdomen is facing your abdomen.   Support your breast with 4 fingers under your breast and your thumb above your nipple. Make sure your fingers are well away from   your nipple and your baby's mouth.   Stroke your baby's lips gently with your finger or nipple.   When your baby's mouth is open wide enough, place all of your nipple and as much of the colored area around your nipple (areola) as possible into your baby's mouth.  More areola should be visible above his or her upper lip than below his or her lower lip.  Your  baby's tongue should be between his or her lower gum and your breast.  Ensure that your baby's mouth is correctly positioned around the nipple (latched). Your baby's lips should create a seal on your breast.  Signs that your baby has effectively latched onto your nipple include:  Tugging or sucking without pain.  Swallowing heard between sucks.  Absent click or smacking sound.  Muscle movement above and in front of his or her ears with sucking.  Your baby must suck about 2 3 minutes in order to get your milk. Allow your baby to feed on each breast as long as he or she wants. Nurse your baby until he or she unlatches or falls asleep at the first breast, then offer the second breast.  Signs that your baby is full and satisfied include:  A gradual decrease in the number of sucks or complete cessation of sucking.  Falling asleep.  Extension or relaxation of his or her body.  Retention of a small amount of milk in his or her mouth.  Letting go of your breast by himself or herself.  Signs of effective breastfeeding in you include:  Breasts that have increased firmness, weight, and size prior to feeding.  Breasts that are softer after nursing.  Increased milk volume, as well as a change in milk consistency and color by the 5th day of breastfeeding.  Breast fullness relieved by breastfeeding.  Nipples are not sore, cracked, or bleeding.  If needed, break the suction by putting your finger into the corner of your baby's mouth and sliding your finger between his or her gums. Then, remove your breast from his or her mouth.  It is common for babies to spit up a small amount after a feeding.  Babies often swallow air during feeding. This can make babies fussy. Burping your baby between breasts can help with this.  Vitamin D supplements are recommended for babies who get only breast milk.  Avoid using a pacifier during your baby's first 4 6 weeks.  Avoid supplemental feedings of  water, formula, or juice in place of breastfeeding. Breast milk is all the food your baby needs. It is not necessary for your baby to have water or formula. Your breasts will make more milk if supplemental feedings are avoided during the early weeks. HOW TO TELL WHETHER YOUR BABY IS GETTING ENOUGH BREAST MILK Wondering whether or not your baby is getting enough milk is a common concern among mothers. You can be assured that your baby is getting enough milk if:   Your baby is actively sucking and you hear swallowing.   Your baby seems relaxed and satisfied after a feeding.   Your baby nurses at least 8 12 times in a 24 hour time period.  During the first 3 5 days of age:  Your baby is wetting at least 3 5 diapers in a 24 hour period. The urine should be clear and pale yellow.  Your baby is having at least 3 4 stools in a 24 hour period. The stool should be soft and yellow.  At   5 7 days of age, your baby is having at least 3 6 stools in a 24 hour period. The stool should be seedy and yellow by 5 days of age.  Your baby has a weight loss less than 7 10% during the first 3 days of age.  Your baby does not lose weight after 3 7 days of age.  Your baby gains 4 7 ounces each week after he or she is 4 days of age.  Your baby gains weight by 5 days of age and is back to birth weight within 2 weeks. ENGORGEMENT In the first week after your baby is born, you may experience extremely full breasts (engorgement). When engorged, your breasts may feel heavy, warm, or tender to the touch. Engorgement peaks within 24 48 hours after delivery of your baby.  Engorgement may be reduced by:  Continuing to breastfeed.  Increasing the frequency of breastfeeding.  Taking warm showers or applying warm, moist heat to your breasts just before each feeding. This increases circulation and helps the milk flow.   Gently massaging your breast before and during the feedings. With your fingertips, massage from  your chest wall towards your nipple in a circular motion.   Ensuring that your baby empties at least one breast at every feeding. It also helps to start the next feeding on the opposite breast.   Expressing breast milk by hand or by using a breast pump to empty the breasts if your baby is sleepy, or not nursing well. You may also want to express milk if you are returning to work oryou feel you are getting engorged.  Ensuring your baby is latched on and positioned properly while breastfeeding. If you follow these suggestions, your engorgement should improve in 24 48 hours. If you are still experiencing difficulty, call your lactation consultant or caregiver.  CARING FOR YOURSELF Take care of your breasts.  Bathe or shower daily.   Avoid using soap on your nipples.   Wear a supportive bra. Avoid wearing underwire style bras.  Air dry your nipples for a 3 4minutes after each feeding.   Use only cotton bra pads to absorb breast milk leakage. Leaking of breast milk between feedings is normal.   Use only pure lanolin on your nipples after nursing. You do not need to wash it off before feeding your baby again. Another option is to express a few drops of breast milk and gently massage that milk into your nipples.  Continue breast self-awareness checks. Take care of yourself.  Eat healthy foods. Alternate 3 meals with 3 snacks.  Avoid foods that you notice affect your baby in a bad way.  Drink milk, fruit juice, and water to satisfy your thirst (about 8 glasses a day).   Rest often, relax, and take your prenatal vitamins to prevent fatigue, stress, and anemia.  Avoid chewing and smoking tobacco.  Avoid alcohol and drug use.  Take over-the-counter and prescribed medicine only as directed by your caregiver or pharmacist. You should always check with your caregiver or pharmacist before taking any new medicine, vitamin, or herbal supplement.  Know that pregnancy is possible while  breastfeeding. If desired, talk to your caregiver about family planning and safe birth control methods that may be used while breastfeeding. SEEK MEDICAL CARE IF:   You feel like you want to stop breastfeeding or have become frustrated with breastfeeding.  You have painful breasts or nipples.  Your nipples are cracked or bleeding.  Your breasts are red, tender,   or warm.  You have a swollen area on either breast.  You have a fever or chills.  You have nausea or vomiting.  You have drainage from your nipples.  Your breasts do not become full before feedings by the 5th day after delivery.  You feel sad and depressed.  Your baby is too sleepy to eat well.  Your baby is having trouble sleeping.   Your baby is wetting less than 3 diapers in a 24 hour period.  Your baby has less than 3 stools in a 24 hour period.  Your baby's skin or the white part of his or her eyes becomes more yellow.   Your baby is not gaining weight by 5 days of age. MAKE SURE YOU:   Understand these instructions.  Will watch your condition.  Will get help right away if you are not doing well or get worse. Document Released: 03/06/2005 Document Revised: 11/29/2011 Document Reviewed: 10/11/2011 [employer] Patient Information 2014 [employer].  

## 2012-09-24 ENCOUNTER — Ambulatory Visit (INDEPENDENT_AMBULATORY_CARE_PROVIDER_SITE_OTHER): Payer: 59 | Admitting: Obstetrics & Gynecology

## 2012-09-24 VITALS — BP 118/73 | Wt 163.0 lb

## 2012-09-24 DIAGNOSIS — O4413 Placenta previa with hemorrhage, third trimester: Secondary | ICD-10-CM

## 2012-09-24 DIAGNOSIS — O360131 Maternal care for anti-D [Rh] antibodies, third trimester, fetus 1: Secondary | ICD-10-CM

## 2012-09-24 DIAGNOSIS — Z3480 Encounter for supervision of other normal pregnancy, unspecified trimester: Secondary | ICD-10-CM

## 2012-09-24 DIAGNOSIS — Z302 Encounter for sterilization: Secondary | ICD-10-CM

## 2012-09-24 DIAGNOSIS — O34219 Maternal care for unspecified type scar from previous cesarean delivery: Secondary | ICD-10-CM

## 2012-09-24 DIAGNOSIS — Z3483 Encounter for supervision of other normal pregnancy, third trimester: Secondary | ICD-10-CM

## 2012-09-24 DIAGNOSIS — O360990 Maternal care for other rhesus isoimmunization, unspecified trimester, not applicable or unspecified: Secondary | ICD-10-CM

## 2012-09-24 DIAGNOSIS — Z23 Encounter for immunization: Secondary | ICD-10-CM

## 2012-09-24 DIAGNOSIS — O4410 Placenta previa with hemorrhage, unspecified trimester: Secondary | ICD-10-CM

## 2012-09-24 LAB — CBC
HCT: 32.4 % — ABNORMAL LOW (ref 36.0–46.0)
Hemoglobin: 11.3 g/dL — ABNORMAL LOW (ref 12.0–15.0)
MCH: 30.9 pg (ref 26.0–34.0)
MCV: 88.5 fL (ref 78.0–100.0)
Platelets: 202 10*3/uL (ref 150–400)
RBC: 3.66 MIL/uL — ABNORMAL LOW (ref 3.87–5.11)

## 2012-09-24 MED ORDER — RHO D IMMUNE GLOBULIN 1500 UNIT/2ML IJ SOLN
300.0000 ug | Freq: Once | INTRAMUSCULAR | Status: AC
  Administered 2012-09-24: 300 ug via INTRAMUSCULAR

## 2012-09-24 MED ORDER — TETANUS-DIPHTH-ACELL PERTUSSIS 5-2.5-18.5 LF-MCG/0.5 IM SUSP
0.5000 mL | Freq: Once | INTRAMUSCULAR | Status: AC
  Administered 2012-09-24: 0.5 mL via INTRAMUSCULAR

## 2012-09-24 NOTE — Progress Notes (Signed)
Counseled about TOLAC vs RCS, risks reviewed.  TOLAC consent signed.  Also signed BTL papers.  Counseed about Tdap, will receive today.  Third trimester labs and Rhogam also today. Will also schedule ultrasound to follow up low lying placenta.   No other complaints or concerns.  Fetal movement and labor precautions reviewed.

## 2012-09-24 NOTE — Progress Notes (Signed)
[id number] 

## 2012-09-24 NOTE — Patient Instructions (Addendum)
Return to clinic for any obstetric concerns or go to [facility] for evaluation Tetanus, Diphtheria (Td) or Tetanus, Diphtheria, Pertussis (Tdap) Vaccine What You Need to Know WHY GET VACCINATED? Tetanus, diphtheria and pertussis can be very serious diseases. TETANUS (Lockjaw) causes painful muscle spasms and stiffness, usually all over the body.  Tetanus can lead to tightening of muscles in the head and neck so the victim cannot open his mouth or swallow, or sometimes even breathe. Tetanus kills about 1 out of 5 people who are infected. DIPHTHERIA can cause a thick membrane to cover the back of the throat.  Diphtheria can lead to breathing problems, paralysis, heart failure, and even death. PERTUSSIS (Whooping Cough) causes severe coughing spells which can lead to difficulty breathing, vomiting, and disturbed sleep.  Pertussis can lead to weight loss, incontinence, rib fractures and passing out from violent coughing. Up to 2 in 100 adolescents and 5 in 100 adults with pertussis are hospitalized or have complications, including pneumonia and death. These 3 diseases are all caused by bacteria. Diphtheria and pertussis are spread from person to person. Tetanus enters the body through cuts, scratches, or wounds. The [place] saw as many as 200,000 cases a year of diphtheria and pertussis before vaccines were available, and hundreds of cases of tetanus. Since then, tetanus and diphtheria cases have dropped by about 99% and pertussis cases by about 92%. Children 6 years of age and younger get DTaP vaccine to protect them from these three diseases. But older children, adolescents, and adults need protection too. VACCINES FOR ADOLESCENTS AND ADULTS: TD AND TDAP Two vaccines are available to protect people 7 years of age and older from these diseases:  Td vaccine has been used for many years. It protects against tetanus and diphtheria.  Tdap vaccine was licensed in 2005. It is the first vaccine for  adolescents and adults that protects against pertussis as well as tetanus and diphtheria. A Td booster dose is recommended every 10 years. Tdap is given only once. WHICH VACCINE, AND WHEN? Ages 7 through 18 years  A dose of Tdap is recommended at age 11 or 12. This dose could be given as early as age 7 for children who missed one or more childhood doses of DTaP.  Children and adolescents who did not get a complete series of DTaP shots by age 7 should complete the series using a combination of Td and Tdap. Age 19 years and Older  All adults should get a booster dose of Td every 10 years. Adults under 65 who have never gotten Tdap should get a dose of Tdap as their next booster dose. Adults 65 and older may get one booster dose of Tdap.  Adults (including women who may become pregnant and adults 65 and older) who expect to have close contact with a baby younger than 12 months of age should get a dose of Tdap to help protect the baby from pertussis.  Healthcare professionals who have direct patient contact in hospitals or clinics should get one dose of Tdap. Protection After a Wound  A person who gets a severe cut or burn might need a dose of Td or Tdap to prevent tetanus infection. Tdap should be used for anyone who has never had a dose previously. Td should be used if Tdap is not available, or for:  Anybody who has already had a dose of Tdap.  Children 7 through 9 years of age who completed the childhood DTaP series.  Adults 65 and older.   Pregnant Women  Pregnant women who have never had a dose of Tdap should get one, after the 20th week of gestation and preferably during the 3rd trimester. If they do not get Tdap during their pregnancy they should get a dose as soon as possible after delivery. Pregnant women who have previously received Tdap and need tetanus or diphtheria vaccine while pregnant should get Td. Tdap and Td may be given at the same time as other vaccines. SOME PEOPLE SHOULD  NOT BE VACCINATED OR SHOULD WAIT  Anyone who has had a life-threatening allergic reaction after a dose of any tetanus, diphtheria, or pertussis containing vaccine should not get Td or Tdap.  Anyone who has a severe allergy to any component of a vaccine should not get that vaccine. Tell your doctor if the person getting the vaccine has any severe allergies.  Anyone who had a coma, or long or multiple seizures within 7 days after a dose of DTP or DTaP should not get Tdap, unless a cause other than the vaccine was found. These people may get Td.  Talk to your doctor if the person getting either vaccine:  Has epilepsy or another nervous system problem.  Had severe swelling or severe pain after a previous dose of DTP, DTaP, DT, Td, or Tdap vaccine.  Has had Guillain Barr Syndrome (GBS). Anyone who has a moderate or severe illness on the day the shot is scheduled should usually wait until they recover before getting Tdap or Td vaccine. A person with a mild illness or low fever can usually be vaccinated. WHAT ARE THE RISKS FROM TDAP AND TD VACCINES? With a vaccine, as with any medicine, there is always a small risk of a life-threatening allergic reaction or other serious problem. Brief fainting spells and related symptoms (such as jerking movements) can happen after any medical procedure, including vaccination. Sitting or lying down for about 15 minutes after a vaccination can help prevent fainting and injuries caused by falls. Tell your doctor if the patient feels dizzy or lightheaded, or has vision changes or ringing in the ears. Getting tetanus, diphtheria, or pertussis would be much more likely to lead to severe problems than getting either Td or Tdap vaccine. Problems reported after Td and Tdap vaccines are listed below. Mild Problems (noticeable, but did not interfere with activities) Tdap  Pain (about 3 in 4 adolescents and 2 in 3 adults).  Redness or swelling (about 1 in 5).  Mild fever  of at least 100.4 F (38 C) (up to about 1 in 25 adolescents and 1 in 100 adults).  Headache (about 4 in 10 adolescents and 3 in 10 adults).  Tiredness (about 1 in 3 adolescents and 1 in 4 adults).  Nausea, vomiting, diarrhea, or stomach ache (up to 1 in 4 adolescents and 1 in 10 adults).  Chills, body aches, sore joints, rash, or swollen glands (uncommon). Td  Pain (up to about 8 in 10).  Redness or swelling at the injection site (up to about 1 in 3).  Mild fever (up to about 1 in 15).  Headache or tiredness (uncommon). Moderate Problems (interfered with activities, but did not require medical attention) Tdap  Pain at the injection site (about 1 in 20 adolescents and 1 in 100 adults).  Redness or swelling at the injection site (up to about 1 in 16 adolescents and 1 in 25 adults).  Fever over 102 F (38.9 C) (about 1 in 100 adolescents and 1 in 250 adults).  Headache (  1 in 300).  Nausea, vomiting, diarrhea, or stomach ache (up to 3 in 100 adolescents and 1 in 100 adults). Td  Fever over 102 F (38.9 C) (rare). Tdap or Td  Extensive swelling of the arm where the shot was given (up to about 3 in 100). Severe Problems (Unable to perform usual activities; required medical attention) Tdap or Td  Swelling, severe pain, bleeding, and redness in the arm where the shot was given (rare). A severe allergic reaction could occur after any vaccine. They are estimated to occur less than once in a million doses. WHAT IF THERE IS A SEVERE REACTION? What should I look for? Any unusual condition, such as a severe allergic reaction or a high fever. If a severe allergic reaction occurred, it would be within a few minutes to an hour after the shot. Signs of a serious allergic reaction can include difficulty breathing, weakness, hoarseness or wheezing, a fast heartbeat, hives, dizziness, paleness, or swelling of the throat. What should I do?  Call a doctor, or get the person to a doctor  right away.  Tell your doctor what happened, the date and time it happened, and when the vaccination was given.  Ask your provider to report the reaction by filing a Vaccine Adverse Event Reporting System (VAERS) form. Or, you can file this report through the VAERS website at www.vaers.hhs.gov or by calling 1-800-822-7967. VAERS does not provide medical advice. THE NATIONAL VACCINE INJURY COMPENSATION PROGRAM The National Vaccine Injury Compensation Program (VICP) was created in 1986. Persons who believe they may have been injured by a vaccine can learn about the program and about filing a claim by calling 1-800-338-2382 or visiting the VICP website at www.hrsa.gov/vaccinecompensation. HOW CAN I LEARN MORE?  Your doctor can give you the vaccine package insert or suggest other sources of information.  Call your local or state health department.  Contact the Centers for Disease Control and Prevention (CDC):  Call 1-800-232-4636 (1-800-CDC-INFO).  Visit the CDC website at www.cdc.gov/vaccines. CDC Td and Tdap Interim VIS (04/12/10) Document Released: 01/01/2006 Document Revised: 05/29/2011 Document Reviewed: 04/12/2010 [employer] Patient Information 2014 [employer].  

## 2012-09-25 ENCOUNTER — Encounter: Payer: Self-pay | Admitting: Obstetrics & Gynecology

## 2012-09-25 LAB — GLUCOSE TOLERANCE, 1 HOUR (50G) W/O FASTING: Glucose, 1 Hour GTT: 117 mg/dL (ref 70–140)

## 2012-09-30 ENCOUNTER — Encounter: Payer: Self-pay | Admitting: Obstetrics & Gynecology

## 2012-09-30 ENCOUNTER — Ambulatory Visit (HOSPITAL_COMMUNITY)
Admission: RE | Admit: 2012-09-30 | Discharge: 2012-09-30 | Disposition: A | Discharge status: Home / Self Care | Payer: 59 | Source: Ambulatory Visit | Attending: Obstetrics & Gynecology | Admitting: Obstetrics & Gynecology

## 2012-09-30 DIAGNOSIS — O365990 Maternal care for other known or suspected poor fetal growth, unspecified trimester, not applicable or unspecified: Secondary | ICD-10-CM | POA: Insufficient documentation

## 2012-09-30 DIAGNOSIS — O34219 Maternal care for unspecified type scar from previous cesarean delivery: Secondary | ICD-10-CM | POA: Insufficient documentation

## 2012-09-30 DIAGNOSIS — O4400 Placenta previa specified as without hemorrhage, unspecified trimester: Secondary | ICD-10-CM | POA: Insufficient documentation

## 2012-09-30 DIAGNOSIS — O4413 Placenta previa with hemorrhage, third trimester: Secondary | ICD-10-CM

## 2012-10-16 ENCOUNTER — Encounter: Payer: Self-pay | Admitting: Family Medicine

## 2012-10-16 ENCOUNTER — Ambulatory Visit (INDEPENDENT_AMBULATORY_CARE_PROVIDER_SITE_OTHER): Payer: 59 | Admitting: Family Medicine

## 2012-10-16 VITALS — BP 122/80 | Wt 168.0 lb

## 2012-10-16 DIAGNOSIS — O4410 Placenta previa with hemorrhage, unspecified trimester: Secondary | ICD-10-CM

## 2012-10-16 DIAGNOSIS — Z3480 Encounter for supervision of other normal pregnancy, unspecified trimester: Secondary | ICD-10-CM

## 2012-10-16 DIAGNOSIS — O4413 Placenta previa with hemorrhage, third trimester: Secondary | ICD-10-CM

## 2012-10-16 NOTE — Progress Notes (Signed)
P= [id number] 

## 2012-10-16 NOTE — Patient Instructions (Signed)
Pregnancy - Third Trimester The third trimester of pregnancy (the last 3 months) is a period of the most rapid growth for you and your baby. The baby approaches a length of 20 inches and a weight of 6 to 10 pounds. The baby is adding on fat and getting ready for life outside your body. While inside, babies have periods of sleeping and waking, sucking thumbs, and hiccuping. You can often feel small contractions of the uterus. This is false labor. It is also called Braxton-Hicks contractions. This is like a practice for labor. The usual problems in this stage of pregnancy include more difficulty breathing, swelling of the hands and feet from water retention, and having to urinate more often because of the uterus and baby pressing on your bladder.  PRENATAL EXAMS  Blood work may continue to be done during prenatal exams. These tests are done to check on your health and the probable health of your baby. Blood work is used to follow your blood levels (hemoglobin). Anemia (low hemoglobin) is common during pregnancy. Iron and vitamins are given to help prevent this. You may also continue to be checked for diabetes. Some of the past blood tests may be done again.  The size of the uterus is measured during each visit. This makes sure your baby is growing properly according to your pregnancy dates.  Your blood pressure is checked every prenatal visit. This is to make sure you are not getting toxemia.  Your urine is checked every prenatal visit for infection, diabetes, and protein.  Your weight is checked at each visit. This is done to make sure gains are happening at the suggested rate and that you and your baby are growing normally.  Sometimes, an ultrasound is performed to confirm the position and the proper growth and development of the baby. This is a test done that bounces harmless sound waves off the baby so your caregiver can more accurately determine a due date.  Discuss the type of pain medicine and  anesthesia you will have during your labor and delivery.  Discuss the possibility and anesthesia if a cesarean section might be necessary.  Inform your caregiver if there is any mental or physical violence at home. Sometimes, a specialized non-stress test, contraction stress test, and biophysical profile are done to make sure the baby is not having a problem. Checking the amniotic fluid surrounding the baby is called an amniocentesis. The amniotic fluid is removed by sticking a needle into the belly (abdomen). This is sometimes done near the end of pregnancy if an early delivery is required. In this case, it is done to help make sure the baby's lungs are mature enough for the baby to live outside of the womb. If the lungs are not mature and it is unsafe to deliver the baby, an injection of cortisone medicine is given to the mother 1 to 2 days before the delivery. This helps the baby's lungs mature and makes it safer to deliver the baby. CHANGES OCCURING IN THE THIRD TRIMESTER OF PREGNANCY Your body goes through many changes during pregnancy. They vary from person to person. Talk to your caregiver about changes you notice and are concerned about.  During the last trimester, you have probably had an increase in your appetite. It is normal to have cravings for certain foods. This varies from person to person and pregnancy to pregnancy.  You may begin to get stretch marks on your hips, abdomen, and breasts. These are normal changes in the body   during pregnancy. There are no exercises or medicines to take which prevent this change.  Constipation may be treated with a stool softener or adding bulk to your diet. Drinking lots of fluids, fiber in vegetables, fruits, and whole grains are helpful.  Exercising is also helpful. If you have been very active up until your pregnancy, most of these activities can be continued during your pregnancy. If you have been less active, it is helpful to start an exercise  program such as walking. Consult your caregiver before starting exercise programs.  Avoid all smoking, alcohol, non-prescribed drugs, herbs and "street drugs" during your pregnancy. These chemicals affect the formation and growth of the baby. Avoid chemicals throughout the pregnancy to ensure the delivery of a healthy infant.  Backache, varicose veins, and hemorrhoids may develop or get worse.  You will tire more easily in the third trimester, which is normal.  The baby's movements may be stronger and more often.  You may become short of breath easily.  Your belly button may stick out.  A yellow discharge may leak from your breasts called colostrum.  You may have a bloody mucus discharge. This usually occurs a few days to a week before labor begins. HOME CARE INSTRUCTIONS   Keep your caregiver's appointments. Follow your caregiver's instructions regarding medicine use, exercise, and diet.  During pregnancy, you are providing food for you and your baby. Continue to eat regular, well-balanced meals. Choose foods such as meat, fish, milk and other low fat dairy products, vegetables, fruits, and whole-grain breads and cereals. Your caregiver will tell you of the ideal weight gain.  A physical sexual relationship may be continued throughout pregnancy if there are no other problems such as early (premature) leaking of amniotic fluid from the membranes, vaginal bleeding, or belly (abdominal) pain.  Exercise regularly if there are no restrictions. Check with your caregiver if you are unsure of the safety of your exercises. Greater weight gain will occur in the last 2 trimesters of pregnancy. Exercising helps:  Control your weight.  Get you in shape for labor and delivery.  You lose weight after you deliver.  Rest a lot with legs elevated, or as needed for leg cramps or low back pain.  Wear a good support or jogging bra for breast tenderness during pregnancy. This may help if worn during  sleep. Pads or tissues may be used in the bra if you are leaking colostrum.  Do not use hot tubs, steam rooms, or saunas.  Wear your seat belt when driving. This protects you and your baby if you are in an accident.  Avoid raw meat, cat litter boxes and soil used by cats. These carry germs that can cause birth defects in the baby.  It is easier to leak urine during pregnancy. Tightening up and strengthening the pelvic muscles will help with this problem. You can practice stopping your urination while you are going to the bathroom. These are the same muscles you need to strengthen. It is also the muscles you would use if you were trying to stop from passing gas. You can practice tightening these muscles up 10 times a set and repeating this about 3 times per day. Once you know what muscles to tighten up, do not perform these exercises during urination. It is more likely to cause an infection by backing up the urine.  Ask for help if you have financial, counseling, or nutritional needs during pregnancy. Your caregiver will be able to offer counseling for these   needs as well as refer you for other special needs.  Make a list of emergency phone numbers and have them available.  Plan on getting help from family or friends when you go home from the hospital.  Make a trial run to the hospital.  Take prenatal classes with the father to understand, practice, and ask questions about the labor and delivery.  Prepare the baby's room or nursery.  Do not travel out of the city unless it is absolutely necessary and with the advice of your caregiver.  Wear only low or no heal shoes to have better balance and prevent falling. MEDICINES AND DRUG USE IN PREGNANCY  Take prenatal vitamins as directed. The vitamin should contain 1 milligram of folic acid. Keep all vitamins out of reach of children. Only a couple vitamins or tablets containing iron may be fatal to a baby or young child when ingested.  Avoid use  of all medicines, including herbs, over-the-counter medicines, not prescribed or suggested by your caregiver. Only take over-the-counter or prescription medicines for pain, discomfort, or fever as directed by your caregiver. Do not use aspirin, ibuprofen or naproxen unless approved by your caregiver.  Let your caregiver also know about herbs you may be using.  Alcohol is related to a number of birth defects. This includes fetal alcohol syndrome. All alcohol, in any form, should be avoided completely. Smoking will cause low birth rate and premature babies.  Illegal drugs are very harmful to the baby. They are absolutely forbidden. A baby born to an addicted mother will be addicted at birth. The baby will go through the same withdrawal an adult does. SEEK MEDICAL CARE IF: You have any concerns or worries during your pregnancy. It is better to call with your questions if you feel they cannot wait, rather than worry about them. SEEK IMMEDIATE MEDICAL CARE IF:   An unexplained oral temperature above 102 F (38.9 C) develops, or as your caregiver suggests.  You have leaking of fluid from the vagina. If leaking membranes are suspected, take your temperature and tell your caregiver of this when you call.  There is vaginal spotting, bleeding or passing clots. Tell your caregiver of the amount and how many pads are used.  You develop a bad smelling vaginal discharge with a change in the color from clear to white.  You develop vomiting that lasts more than 24 hours.  You develop chills or fever.  You develop shortness of breath.  You develop burning on urination.  You loose more than 2 pounds of weight or gain more than 2 pounds of weight or as suggested by your caregiver.  You notice sudden swelling of your face, hands, and feet or legs.  You develop belly (abdominal) pain. Round ligament discomfort is a common non-cancerous (benign) cause of abdominal pain in pregnancy. Your caregiver still  must evaluate you.  You develop a severe headache that does not go away.  You develop visual problems, blurred or double vision.  If you have not felt your baby move for more than 1 hour. If you think the baby is not moving as much as usual, eat something with sugar in it and lie down on your left side for an hour. The baby should move at least 4 to 5 times per hour. Call right away if your baby moves less than that.  You fall, are in a car accident, or any kind of trauma.  There is mental or physical violence at home. Document Released: 02/28/2001   Document Revised: 11/29/2011 Document Reviewed: 09/02/2008 [employer] Patient Information 2014 [employer].  Breastfeeding A change in hormones during your pregnancy causes growth of your breast tissue and an increase in number and size of milk ducts. The hormone prolactin allows proteins, sugars, and fats from your blood supply to make breast milk in your milk-producing glands. The hormone progesterone prevents breast milk from being released before the birth of your baby. After the birth of your baby, your progesterone level decreases allowing breast milk to be released. Thoughts of your baby, as well as his or her sucking or crying, can stimulate the release of milk from the milk-producing glands. Deciding to breastfeed (nurse) is one of the best choices you can make for you and your baby. The information that follows gives a brief review of the benefits, as well as other important skills to know about breastfeeding. BENEFITS OF BREASTFEEDING For your baby  The first milk (colostrum) helps your baby's digestive system function better.   There are antibodies in your milk that help your baby fight off infections.   Your baby has a lower incidence of asthma, allergies, and sudden infant death syndrome (SIDS).   The nutrients in breast milk are better for your baby than infant formulas.  Breast milk improves your baby's brain development.    Your baby will have less gas, colic, and constipation.  Your baby is less likely to develop other conditions, such as childhood obesity, asthma, or diabetes mellitus. For you  Breastfeeding helps develop a very special bond between you and your baby.   Breastfeeding is convenient, always available at the correct temperature, and costs nothing.   Breastfeeding helps to burn calories and helps you lose the weight gained during pregnancy.   Breastfeeding makes your uterus contract back down to normal size faster and slows bleeding following delivery.   Breastfeeding mothers have a lower risk of developing osteoporosis or breast or ovarian cancer later in life.  BREASTFEEDING FREQUENCY  A healthy, full-term baby may breastfeed as often as every hour or space his or her feedings to every 3 hours. Breastfeeding frequency will vary from baby to baby.   Newborns should be fed no less than every 2 3 hours during the day and every 4 5 hours during the night. You should breastfeed a minimum of 8 feedings in a 24 hour period.  Awaken your baby to breastfeed if it has been 3 4 hours since the last feeding.  Breastfeed when you feel the need to reduce the fullness of your breasts or when your newborn shows signs of hunger. Signs that your baby may be hungry include:  Increased alertness or activity.  Stretching.  Movement of the head from side to side.  Movement of the head and opening of the mouth when the corner of the mouth or cheek is stroked (rooting).  Increased sucking sounds, smacking lips, cooing, sighing, or squeaking.  Hand-to-mouth movements.  Increased sucking of fingers or hands.  Fussing.  Intermittent crying.  Signs of extreme hunger will require calming and consoling before you try to feed your baby. Signs of extreme hunger may include:  Restlessness.  A loud, strong cry.  Screaming.  Frequent feeding will help you make more milk and will help prevent  problems, such as sore nipples and engorgement of the breasts.  BREASTFEEDING   Whether lying down or sitting, be sure that the baby's abdomen is facing your abdomen.   Support your breast with 4 fingers under your breast   and your thumb above your nipple. Make sure your fingers are well away from your nipple and your baby's mouth.   Stroke your baby's lips gently with your finger or nipple.   When your baby's mouth is open wide enough, place all of your nipple and as much of the colored area around your nipple (areola) as possible into your baby's mouth.  More areola should be visible above his or her upper lip than below his or her lower lip.  Your baby's tongue should be between his or her lower gum and your breast.  Ensure that your baby's mouth is correctly positioned around the nipple (latched). Your baby's lips should create a seal on your breast.  Signs that your baby has effectively latched onto your nipple include:  Tugging or sucking without pain.  Swallowing heard between sucks.  Absent click or smacking sound.  Muscle movement above and in front of his or her ears with sucking.  Your baby must suck about 2 3 minutes in order to get your milk. Allow your baby to feed on each breast as long as he or she wants. Nurse your baby until he or she unlatches or falls asleep at the first breast, then offer the second breast.  Signs that your baby is full and satisfied include:  A gradual decrease in the number of sucks or complete cessation of sucking.  Falling asleep.  Extension or relaxation of his or her body.  Retention of a small amount of milk in his or her mouth.  Letting go of your breast by himself or herself.  Signs of effective breastfeeding in you include:  Breasts that have increased firmness, weight, and size prior to feeding.  Breasts that are softer after nursing.  Increased milk volume, as well as a change in milk consistency and color by the 5th  day of breastfeeding.  Breast fullness relieved by breastfeeding.  Nipples are not sore, cracked, or bleeding.  If needed, break the suction by putting your finger into the corner of your baby's mouth and sliding your finger between his or her gums. Then, remove your breast from his or her mouth.  It is common for babies to spit up a small amount after a feeding.  Babies often swallow air during feeding. This can make babies fussy. Burping your baby between breasts can help with this.  Vitamin D supplements are recommended for babies who get only breast milk.  Avoid using a pacifier during your baby's first 4 6 weeks.  Avoid supplemental feedings of water, formula, or juice in place of breastfeeding. Breast milk is all the food your baby needs. It is not necessary for your baby to have water or formula. Your breasts will make more milk if supplemental feedings are avoided during the early weeks. HOW TO TELL WHETHER YOUR BABY IS GETTING ENOUGH BREAST MILK Wondering whether or not your baby is getting enough milk is a common concern among mothers. You can be assured that your baby is getting enough milk if:   Your baby is actively sucking and you hear swallowing.   Your baby seems relaxed and satisfied after a feeding.   Your baby nurses at least 8 12 times in a 24 hour time period.  During the first 3 5 days of age:  Your baby is wetting at least 3 5 diapers in a 24 hour period. The urine should be clear and pale yellow.  Your baby is having at least 3 4 stools in   a 24 hour period. The stool should be soft and yellow.  At 5 7 days of age, your baby is having at least 3 6 stools in a 24 hour period. The stool should be seedy and yellow by 5 days of age.  Your baby has a weight loss less than 7 10% during the first 3 days of age.  Your baby does not lose weight after 3 7 days of age.  Your baby gains 4 7 ounces each week after he or she is 4 days of age.  Your baby gains weight  by 5 days of age and is back to birth weight within 2 weeks. ENGORGEMENT In the first week after your baby is born, you may experience extremely full breasts (engorgement). When engorged, your breasts may feel heavy, warm, or tender to the touch. Engorgement peaks within 24 48 hours after delivery of your baby.  Engorgement may be reduced by:  Continuing to breastfeed.  Increasing the frequency of breastfeeding.  Taking warm showers or applying warm, moist heat to your breasts just before each feeding. This increases circulation and helps the milk flow.   Gently massaging your breast before and during the feedings. With your fingertips, massage from your chest wall towards your nipple in a circular motion.   Ensuring that your baby empties at least one breast at every feeding. It also helps to start the next feeding on the opposite breast.   Expressing breast milk by hand or by using a breast pump to empty the breasts if your baby is sleepy, or not nursing well. You may also want to express milk if you are returning to work oryou feel you are getting engorged.  Ensuring your baby is latched on and positioned properly while breastfeeding. If you follow these suggestions, your engorgement should improve in 24 48 hours. If you are still experiencing difficulty, call your lactation consultant or caregiver.  CARING FOR YOURSELF Take care of your breasts.  Bathe or shower daily.   Avoid using soap on your nipples.   Wear a supportive bra. Avoid wearing underwire style bras.  Air dry your nipples for a 3 4minutes after each feeding.   Use only cotton bra pads to absorb breast milk leakage. Leaking of breast milk between feedings is normal.   Use only pure lanolin on your nipples after nursing. You do not need to wash it off before feeding your baby again. Another option is to express a few drops of breast milk and gently massage that milk into your nipples.  Continue breast  self-awareness checks. Take care of yourself.  Eat healthy foods. Alternate 3 meals with 3 snacks.  Avoid foods that you notice affect your baby in a bad way.  Drink milk, fruit juice, and water to satisfy your thirst (about 8 glasses a day).   Rest often, relax, and take your prenatal vitamins to prevent fatigue, stress, and anemia.  Avoid chewing and smoking tobacco.  Avoid alcohol and drug use.  Take over-the-counter and prescribed medicine only as directed by your caregiver or pharmacist. You should always check with your caregiver or pharmacist before taking any new medicine, vitamin, or herbal supplement.  Know that pregnancy is possible while breastfeeding. If desired, talk to your caregiver about family planning and safe birth control methods that may be used while breastfeeding. SEEK MEDICAL CARE IF:   You feel like you want to stop breastfeeding or have become frustrated with breastfeeding.  You have painful breasts or nipples.    Your nipples are cracked or bleeding.  Your breasts are red, tender, or warm.  You have a swollen area on either breast.  You have a fever or chills.  You have nausea or vomiting.  You have drainage from your nipples.  Your breasts do not become full before feedings by the 5th day after delivery.  You feel sad and depressed.  Your baby is too sleepy to eat well.  Your baby is having trouble sleeping.   Your baby is wetting less than 3 diapers in a 24 hour period.  Your baby has less than 3 stools in a 24 hour period.  Your baby's skin or the white part of his or her eyes becomes more yellow.   Your baby is not gaining weight by 5 days of age. MAKE SURE YOU:   Understand these instructions.  Will watch your condition.  Will get help right away if you are not doing well or get worse. Document Released: 03/06/2005 Document Revised: 11/29/2011 Document Reviewed: 10/11/2011 [employer] Patient Information 2014 [employer].  

## 2012-10-16 NOTE — Progress Notes (Signed)
Discussed placental location--very interested in TOLAC.  Will repeat U/s between 35-36 wks, to give enough time to move.

## 2012-10-31 ENCOUNTER — Ambulatory Visit (INDEPENDENT_AMBULATORY_CARE_PROVIDER_SITE_OTHER): Payer: 59 | Admitting: Obstetrics & Gynecology

## 2012-10-31 ENCOUNTER — Encounter: Payer: Self-pay | Admitting: Obstetrics & Gynecology

## 2012-10-31 VITALS — BP 131/83 | Wt 168.0 lb

## 2012-10-31 DIAGNOSIS — O360131 Maternal care for anti-D [Rh] antibodies, third trimester, fetus 1: Secondary | ICD-10-CM

## 2012-10-31 DIAGNOSIS — O34219 Maternal care for unspecified type scar from previous cesarean delivery: Secondary | ICD-10-CM

## 2012-10-31 DIAGNOSIS — O4413 Placenta previa with hemorrhage, third trimester: Secondary | ICD-10-CM

## 2012-10-31 DIAGNOSIS — O4410 Placenta previa with hemorrhage, unspecified trimester: Secondary | ICD-10-CM

## 2012-10-31 DIAGNOSIS — Z302 Encounter for sterilization: Secondary | ICD-10-CM

## 2012-10-31 DIAGNOSIS — Z3480 Encounter for supervision of other normal pregnancy, unspecified trimester: Secondary | ICD-10-CM

## 2012-10-31 DIAGNOSIS — O360990 Maternal care for other rhesus isoimmunization, unspecified trimester, not applicable or unspecified: Secondary | ICD-10-CM

## 2012-10-31 NOTE — Patient Instructions (Signed)
Return to clinic for any obstetric concerns or go to [facility] for evaluation  

## 2012-10-31 NOTE — Progress Notes (Signed)
Next ultrasound to be scheduled in 2-3 weeks to follow up placenta.  No other complaints or concerns.  Fetal movement and labor precautions reviewed.

## 2012-11-04 ENCOUNTER — Ambulatory Visit (INDEPENDENT_AMBULATORY_CARE_PROVIDER_SITE_OTHER): Payer: 59 | Admitting: Obstetrics & Gynecology

## 2012-11-04 VITALS — BP 117/63 | Wt 170.0 lb

## 2012-11-04 DIAGNOSIS — O34219 Maternal care for unspecified type scar from previous cesarean delivery: Secondary | ICD-10-CM

## 2012-11-04 DIAGNOSIS — Z3483 Encounter for supervision of other normal pregnancy, third trimester: Secondary | ICD-10-CM

## 2012-11-04 DIAGNOSIS — Z3480 Encounter for supervision of other normal pregnancy, unspecified trimester: Secondary | ICD-10-CM

## 2012-11-04 DIAGNOSIS — O4410 Placenta previa with hemorrhage, unspecified trimester: Secondary | ICD-10-CM

## 2012-11-04 DIAGNOSIS — O4413 Placenta previa with hemorrhage, third trimester: Secondary | ICD-10-CM

## 2012-11-04 NOTE — Patient Instructions (Signed)
Return to clinic for any obstetric concerns or go to [facility] for evaluation  

## 2012-11-04 NOTE — Progress Notes (Signed)
Ultrasound on 11/20/12 to follow up low lying placenta.  No other complaints or concerns.  Fetal movement and labor precautions reviewed.

## 2012-11-13 ENCOUNTER — Ambulatory Visit (INDEPENDENT_AMBULATORY_CARE_PROVIDER_SITE_OTHER): Payer: 59 | Admitting: Obstetrics & Gynecology

## 2012-11-13 VITALS — BP 121/67 | Wt 173.0 lb

## 2012-11-13 DIAGNOSIS — O360131 Maternal care for anti-D [Rh] antibodies, third trimester, fetus 1: Secondary | ICD-10-CM

## 2012-11-13 DIAGNOSIS — Z3483 Encounter for supervision of other normal pregnancy, third trimester: Secondary | ICD-10-CM

## 2012-11-13 DIAGNOSIS — O34219 Maternal care for unspecified type scar from previous cesarean delivery: Secondary | ICD-10-CM

## 2012-11-13 DIAGNOSIS — Z3480 Encounter for supervision of other normal pregnancy, unspecified trimester: Secondary | ICD-10-CM

## 2012-11-13 DIAGNOSIS — O360990 Maternal care for other rhesus isoimmunization, unspecified trimester, not applicable or unspecified: Secondary | ICD-10-CM

## 2012-11-13 MED ORDER — BREAST PUMP MISC: Status: DC

## 2012-11-13 NOTE — Progress Notes (Signed)
No complaints or concerns. Pelvic cultures to be done next visit.  Will  followup results of 11/20/12 ultrasound to reevaluate placental location.  Fetal movement and labor precautions reviewed.

## 2012-11-13 NOTE — Patient Instructions (Signed)
Return to clinic for any obstetric concerns or go to [facility] for evaluation  

## 2012-11-13 NOTE — Progress Notes (Signed)
P-84  

## 2012-11-20 ENCOUNTER — Other Ambulatory Visit: Payer: Self-pay | Admitting: Family Medicine

## 2012-11-20 ENCOUNTER — Ambulatory Visit (HOSPITAL_COMMUNITY): Admission: RE | Admit: 2012-11-20 | Payer: 59 | Source: Ambulatory Visit

## 2012-11-20 ENCOUNTER — Ambulatory Visit (HOSPITAL_COMMUNITY)
Admission: RE | Admit: 2012-11-20 | Discharge: 2012-11-20 | Disposition: A | Discharge status: Home / Self Care | Payer: 59 | Source: Ambulatory Visit | Attending: Family Medicine | Admitting: Family Medicine

## 2012-11-20 DIAGNOSIS — O4413 Placenta previa with hemorrhage, third trimester: Secondary | ICD-10-CM

## 2012-11-20 DIAGNOSIS — O4400 Placenta previa specified as without hemorrhage, unspecified trimester: Secondary | ICD-10-CM | POA: Insufficient documentation

## 2012-11-20 DIAGNOSIS — O34219 Maternal care for unspecified type scar from previous cesarean delivery: Secondary | ICD-10-CM | POA: Insufficient documentation

## 2012-11-20 DIAGNOSIS — O365990 Maternal care for other known or suspected poor fetal growth, unspecified trimester, not applicable or unspecified: Secondary | ICD-10-CM | POA: Insufficient documentation

## 2012-11-20 NOTE — Progress Notes (Signed)
[name]  was seen today for an ultrasound appointment.  See full report in [facility].  Impression: Single IUP at 33 6/7 weeks Fetal growth is appropriate (48th %tile) Normal interval anatomy Normal amniotic fluid volume  TVUS - cervical length 3.4 cm.  Posterior placenta without evidence of previa / low lying placenta  Recommendations: Follow-up ultrasounds as clinically indicated.   Alpha Gula, MD

## 2012-11-25 ENCOUNTER — Ambulatory Visit (INDEPENDENT_AMBULATORY_CARE_PROVIDER_SITE_OTHER): Payer: 59 | Admitting: Family Medicine

## 2012-11-25 VITALS — BP 115/75 | Wt 175.4 lb

## 2012-11-25 DIAGNOSIS — Z3480 Encounter for supervision of other normal pregnancy, unspecified trimester: Secondary | ICD-10-CM

## 2012-11-25 DIAGNOSIS — O4413 Placenta previa with hemorrhage, third trimester: Secondary | ICD-10-CM

## 2012-11-25 DIAGNOSIS — O4410 Placenta previa with hemorrhage, unspecified trimester: Secondary | ICD-10-CM

## 2012-11-25 LAB — OB RESULTS CONSOLE GBS: GBS: NEGATIVE

## 2012-11-25 NOTE — Patient Instructions (Signed)
Pregnancy - Third Trimester The third trimester of pregnancy (the last 3 months) is a period of the most rapid growth for you and your baby. The baby approaches a length of 20 inches and a weight of 6 to 10 pounds. The baby is adding on fat and getting ready for life outside your body. While inside, babies have periods of sleeping and waking, sucking thumbs, and hiccuping. You can often feel small contractions of the uterus. This is false labor. It is also called Braxton-Hicks contractions. This is like a practice for labor. The usual problems in this stage of pregnancy include more difficulty breathing, swelling of the hands and feet from water retention, and having to urinate more often because of the uterus and baby pressing on your bladder.  PRENATAL EXAMS  Blood work may continue to be done during prenatal exams. These tests are done to check on your health and the probable health of your baby. Blood work is used to follow your blood levels (hemoglobin). Anemia (low hemoglobin) is common during pregnancy. Iron and vitamins are given to help prevent this. You may also continue to be checked for diabetes. Some of the past blood tests may be done again.  The size of the uterus is measured during each visit. This makes sure your baby is growing properly according to your pregnancy dates.  Your blood pressure is checked every prenatal visit. This is to make sure you are not getting toxemia.  Your urine is checked every prenatal visit for infection, diabetes, and protein.  Your weight is checked at each visit. This is done to make sure gains are happening at the suggested rate and that you and your baby are growing normally.  Sometimes, an ultrasound is performed to confirm the position and the proper growth and development of the baby. This is a test done that bounces harmless sound waves off the baby so your caregiver can more accurately determine a due date.  Discuss the type of pain medicine and  anesthesia you will have during your labor and delivery.  Discuss the possibility and anesthesia if a cesarean section might be necessary.  Inform your caregiver if there is any mental or physical violence at home. Sometimes, a specialized non-stress test, contraction stress test, and biophysical profile are done to make sure the baby is not having a problem. Checking the amniotic fluid surrounding the baby is called an amniocentesis. The amniotic fluid is removed by sticking a needle into the belly (abdomen). This is sometimes done near the end of pregnancy if an early delivery is required. In this case, it is done to help make sure the baby's lungs are mature enough for the baby to live outside of the womb. If the lungs are not mature and it is unsafe to deliver the baby, an injection of cortisone medicine is given to the mother 1 to 2 days before the delivery. This helps the baby's lungs mature and makes it safer to deliver the baby. CHANGES OCCURING IN THE THIRD TRIMESTER OF PREGNANCY Your body goes through many changes during pregnancy. They vary from person to person. Talk to your caregiver about changes you notice and are concerned about.  During the last trimester, you have probably had an increase in your appetite. It is normal to have cravings for certain foods. This varies from person to person and pregnancy to pregnancy.  You may begin to get stretch marks on your hips, abdomen, and breasts. These are normal changes in the body   during pregnancy. There are no exercises or medicines to take which prevent this change.  Constipation may be treated with a stool softener or adding bulk to your diet. Drinking lots of fluids, fiber in vegetables, fruits, and whole grains are helpful.  Exercising is also helpful. If you have been very active up until your pregnancy, most of these activities can be continued during your pregnancy. If you have been less active, it is helpful to start an exercise  program such as walking. Consult your caregiver before starting exercise programs.  Avoid all smoking, alcohol, non-prescribed drugs, herbs and "street drugs" during your pregnancy. These chemicals affect the formation and growth of the baby. Avoid chemicals throughout the pregnancy to ensure the delivery of a healthy infant.  Backache, varicose veins, and hemorrhoids may develop or get worse.  You will tire more easily in the third trimester, which is normal.  The baby's movements may be stronger and more often.  You may become short of breath easily.  Your belly button may stick out.  A yellow discharge may leak from your breasts called colostrum.  You may have a bloody mucus discharge. This usually occurs a few days to a week before labor begins. HOME CARE INSTRUCTIONS   Keep your caregiver's appointments. Follow your caregiver's instructions regarding medicine use, exercise, and diet.  During pregnancy, you are providing food for you and your baby. Continue to eat regular, well-balanced meals. Choose foods such as meat, fish, milk and other low fat dairy products, vegetables, fruits, and whole-grain breads and cereals. Your caregiver will tell you of the ideal weight gain.  A physical sexual relationship may be continued throughout pregnancy if there are no other problems such as early (premature) leaking of amniotic fluid from the membranes, vaginal bleeding, or belly (abdominal) pain.  Exercise regularly if there are no restrictions. Check with your caregiver if you are unsure of the safety of your exercises. Greater weight gain will occur in the last 2 trimesters of pregnancy. Exercising helps:  Control your weight.  Get you in shape for labor and delivery.  You lose weight after you deliver.  Rest a lot with legs elevated, or as needed for leg cramps or low back pain.  Wear a good support or jogging bra for breast tenderness during pregnancy. This may help if worn during  sleep. Pads or tissues may be used in the bra if you are leaking colostrum.  Do not use hot tubs, steam rooms, or saunas.  Wear your seat belt when driving. This protects you and your baby if you are in an accident.  Avoid raw meat, cat litter boxes and soil used by cats. These carry germs that can cause birth defects in the baby.  It is easier to leak urine during pregnancy. Tightening up and strengthening the pelvic muscles will help with this problem. You can practice stopping your urination while you are going to the bathroom. These are the same muscles you need to strengthen. It is also the muscles you would use if you were trying to stop from passing gas. You can practice tightening these muscles up 10 times a set and repeating this about 3 times per day. Once you know what muscles to tighten up, do not perform these exercises during urination. It is more likely to cause an infection by backing up the urine.  Ask for help if you have financial, counseling, or nutritional needs during pregnancy. Your caregiver will be able to offer counseling for these   needs as well as refer you for other special needs.  Make a list of emergency phone numbers and have them available.  Plan on getting help from family or friends when you go home from the hospital.  Make a trial run to the hospital.  Take prenatal classes with the father to understand, practice, and ask questions about the labor and delivery.  Prepare the baby's room or nursery.  Do not travel out of the city unless it is absolutely necessary and with the advice of your caregiver.  Wear only low or no heal shoes to have better balance and prevent falling. MEDICINES AND DRUG USE IN PREGNANCY  Take prenatal vitamins as directed. The vitamin should contain 1 milligram of folic acid. Keep all vitamins out of reach of children. Only a couple vitamins or tablets containing iron may be fatal to a baby or young child when ingested.  Avoid use  of all medicines, including herbs, over-the-counter medicines, not prescribed or suggested by your caregiver. Only take over-the-counter or prescription medicines for pain, discomfort, or fever as directed by your caregiver. Do not use aspirin, ibuprofen or naproxen unless approved by your caregiver.  Let your caregiver also know about herbs you may be using.  Alcohol is related to a number of birth defects. This includes fetal alcohol syndrome. All alcohol, in any form, should be avoided completely. Smoking will cause low birth rate and premature babies.  Illegal drugs are very harmful to the baby. They are absolutely forbidden. A baby born to an addicted mother will be addicted at birth. The baby will go through the same withdrawal an adult does. SEEK MEDICAL CARE IF: You have any concerns or worries during your pregnancy. It is better to call with your questions if you feel they cannot wait, rather than worry about them. SEEK IMMEDIATE MEDICAL CARE IF:   An unexplained oral temperature above 102 F (38.9 C) develops, or as your caregiver suggests.  You have leaking of fluid from the vagina. If leaking membranes are suspected, take your temperature and tell your caregiver of this when you call.  There is vaginal spotting, bleeding or passing clots. Tell your caregiver of the amount and how many pads are used.  You develop a bad smelling vaginal discharge with a change in the color from clear to white.  You develop vomiting that lasts more than 24 hours.  You develop chills or fever.  You develop shortness of breath.  You develop burning on urination.  You loose more than 2 pounds of weight or gain more than 2 pounds of weight or as suggested by your caregiver.  You notice sudden swelling of your face, hands, and feet or legs.  You develop belly (abdominal) pain. Round ligament discomfort is a common non-cancerous (benign) cause of abdominal pain in pregnancy. Your caregiver still  must evaluate you.  You develop a severe headache that does not go away.  You develop visual problems, blurred or double vision.  If you have not felt your baby move for more than 1 hour. If you think the baby is not moving as much as usual, eat something with sugar in it and lie down on your left side for an hour. The baby should move at least 4 to 5 times per hour. Call right away if your baby moves less than that.  You fall, are in a car accident, or any kind of trauma.  There is mental or physical violence at home. Document Released: 02/28/2001   Document Revised: 11/29/2011 Document Reviewed: 09/02/2008 [employer] Patient Information 2014 [employer].  Breastfeeding A change in hormones during your pregnancy causes growth of your breast tissue and an increase in number and size of milk ducts. The hormone prolactin allows proteins, sugars, and fats from your blood supply to make breast milk in your milk-producing glands. The hormone progesterone prevents breast milk from being released before the birth of your baby. After the birth of your baby, your progesterone level decreases allowing breast milk to be released. Thoughts of your baby, as well as his or her sucking or crying, can stimulate the release of milk from the milk-producing glands. Deciding to breastfeed (nurse) is one of the best choices you can make for you and your baby. The information that follows gives a brief review of the benefits, as well as other important skills to know about breastfeeding. BENEFITS OF BREASTFEEDING For your baby  The first milk (colostrum) helps your baby's digestive system function better.   There are antibodies in your milk that help your baby fight off infections.   Your baby has a lower incidence of asthma, allergies, and sudden infant death syndrome (SIDS).   The nutrients in breast milk are better for your baby than infant formulas.  Breast milk improves your baby's brain development.    Your baby will have less gas, colic, and constipation.  Your baby is less likely to develop other conditions, such as childhood obesity, asthma, or diabetes mellitus. For you  Breastfeeding helps develop a very special bond between you and your baby.   Breastfeeding is convenient, always available at the correct temperature, and costs nothing.   Breastfeeding helps to burn calories and helps you lose the weight gained during pregnancy.   Breastfeeding makes your uterus contract back down to normal size faster and slows bleeding following delivery.   Breastfeeding mothers have a lower risk of developing osteoporosis or breast or ovarian cancer later in life.  BREASTFEEDING FREQUENCY  A healthy, full-term baby may breastfeed as often as every hour or space his or her feedings to every 3 hours. Breastfeeding frequency will vary from baby to baby.   Newborns should be fed no less than every 2 3 hours during the day and every 4 5 hours during the night. You should breastfeed a minimum of 8 feedings in a 24 hour period.  Awaken your baby to breastfeed if it has been 3 4 hours since the last feeding.  Breastfeed when you feel the need to reduce the fullness of your breasts or when your newborn shows signs of hunger. Signs that your baby may be hungry include:  Increased alertness or activity.  Stretching.  Movement of the head from side to side.  Movement of the head and opening of the mouth when the corner of the mouth or cheek is stroked (rooting).  Increased sucking sounds, smacking lips, cooing, sighing, or squeaking.  Hand-to-mouth movements.  Increased sucking of fingers or hands.  Fussing.  Intermittent crying.  Signs of extreme hunger will require calming and consoling before you try to feed your baby. Signs of extreme hunger may include:  Restlessness.  A loud, strong cry.  Screaming.  Frequent feeding will help you make more milk and will help prevent  problems, such as sore nipples and engorgement of the breasts.  BREASTFEEDING   Whether lying down or sitting, be sure that the baby's abdomen is facing your abdomen.   Support your breast with 4 fingers under your breast   and your thumb above your nipple. Make sure your fingers are well away from your nipple and your baby's mouth.   Stroke your baby's lips gently with your finger or nipple.   When your baby's mouth is open wide enough, place all of your nipple and as much of the colored area around your nipple (areola) as possible into your baby's mouth.  More areola should be visible above his or her upper lip than below his or her lower lip.  Your baby's tongue should be between his or her lower gum and your breast.  Ensure that your baby's mouth is correctly positioned around the nipple (latched). Your baby's lips should create a seal on your breast.  Signs that your baby has effectively latched onto your nipple include:  Tugging or sucking without pain.  Swallowing heard between sucks.  Absent click or smacking sound.  Muscle movement above and in front of his or her ears with sucking.  Your baby must suck about 2 3 minutes in order to get your milk. Allow your baby to feed on each breast as long as he or she wants. Nurse your baby until he or she unlatches or falls asleep at the first breast, then offer the second breast.  Signs that your baby is full and satisfied include:  A gradual decrease in the number of sucks or complete cessation of sucking.  Falling asleep.  Extension or relaxation of his or her body.  Retention of a small amount of milk in his or her mouth.  Letting go of your breast by himself or herself.  Signs of effective breastfeeding in you include:  Breasts that have increased firmness, weight, and size prior to feeding.  Breasts that are softer after nursing.  Increased milk volume, as well as a change in milk consistency and color by the 5th  day of breastfeeding.  Breast fullness relieved by breastfeeding.  Nipples are not sore, cracked, or bleeding.  If needed, break the suction by putting your finger into the corner of your baby's mouth and sliding your finger between his or her gums. Then, remove your breast from his or her mouth.  It is common for babies to spit up a small amount after a feeding.  Babies often swallow air during feeding. This can make babies fussy. Burping your baby between breasts can help with this.  Vitamin D supplements are recommended for babies who get only breast milk.  Avoid using a pacifier during your baby's first 4 6 weeks.  Avoid supplemental feedings of water, formula, or juice in place of breastfeeding. Breast milk is all the food your baby needs. It is not necessary for your baby to have water or formula. Your breasts will make more milk if supplemental feedings are avoided during the early weeks. HOW TO TELL WHETHER YOUR BABY IS GETTING ENOUGH BREAST MILK Wondering whether or not your baby is getting enough milk is a common concern among mothers. You can be assured that your baby is getting enough milk if:   Your baby is actively sucking and you hear swallowing.   Your baby seems relaxed and satisfied after a feeding.   Your baby nurses at least 8 12 times in a 24 hour time period.  During the first 3 5 days of age:  Your baby is wetting at least 3 5 diapers in a 24 hour period. The urine should be clear and pale yellow.  Your baby is having at least 3 4 stools in   a 24 hour period. The stool should be soft and yellow.  At 5 7 days of age, your baby is having at least 3 6 stools in a 24 hour period. The stool should be seedy and yellow by 5 days of age.  Your baby has a weight loss less than 7 10% during the first 3 days of age.  Your baby does not lose weight after 3 7 days of age.  Your baby gains 4 7 ounces each week after he or she is 4 days of age.  Your baby gains weight  by 5 days of age and is back to birth weight within 2 weeks. ENGORGEMENT In the first week after your baby is born, you may experience extremely full breasts (engorgement). When engorged, your breasts may feel heavy, warm, or tender to the touch. Engorgement peaks within 24 48 hours after delivery of your baby.  Engorgement may be reduced by:  Continuing to breastfeed.  Increasing the frequency of breastfeeding.  Taking warm showers or applying warm, moist heat to your breasts just before each feeding. This increases circulation and helps the milk flow.   Gently massaging your breast before and during the feedings. With your fingertips, massage from your chest wall towards your nipple in a circular motion.   Ensuring that your baby empties at least one breast at every feeding. It also helps to start the next feeding on the opposite breast.   Expressing breast milk by hand or by using a breast pump to empty the breasts if your baby is sleepy, or not nursing well. You may also want to express milk if you are returning to work oryou feel you are getting engorged.  Ensuring your baby is latched on and positioned properly while breastfeeding. If you follow these suggestions, your engorgement should improve in 24 48 hours. If you are still experiencing difficulty, call your lactation consultant or caregiver.  CARING FOR YOURSELF Take care of your breasts.  Bathe or shower daily.   Avoid using soap on your nipples.   Wear a supportive bra. Avoid wearing underwire style bras.  Air dry your nipples for a 3 4minutes after each feeding.   Use only cotton bra pads to absorb breast milk leakage. Leaking of breast milk between feedings is normal.   Use only pure lanolin on your nipples after nursing. You do not need to wash it off before feeding your baby again. Another option is to express a few drops of breast milk and gently massage that milk into your nipples.  Continue breast  self-awareness checks. Take care of yourself.  Eat healthy foods. Alternate 3 meals with 3 snacks.  Avoid foods that you notice affect your baby in a bad way.  Drink milk, fruit juice, and water to satisfy your thirst (about 8 glasses a day).   Rest often, relax, and take your prenatal vitamins to prevent fatigue, stress, and anemia.  Avoid chewing and smoking tobacco.  Avoid alcohol and drug use.  Take over-the-counter and prescribed medicine only as directed by your caregiver or pharmacist. You should always check with your caregiver or pharmacist before taking any new medicine, vitamin, or herbal supplement.  Know that pregnancy is possible while breastfeeding. If desired, talk to your caregiver about family planning and safe birth control methods that may be used while breastfeeding. SEEK MEDICAL CARE IF:   You feel like you want to stop breastfeeding or have become frustrated with breastfeeding.  You have painful breasts or nipples.    Your nipples are cracked or bleeding.  Your breasts are red, tender, or warm.  You have a swollen area on either breast.  You have a fever or chills.  You have nausea or vomiting.  You have drainage from your nipples.  Your breasts do not become full before feedings by the 5th day after delivery.  You feel sad and depressed.  Your baby is too sleepy to eat well.  Your baby is having trouble sleeping.   Your baby is wetting less than 3 diapers in a 24 hour period.  Your baby has less than 3 stools in a 24 hour period.  Your baby's skin or the white part of his or her eyes becomes more yellow.   Your baby is not gaining weight by 5 days of age. MAKE SURE YOU:   Understand these instructions.  Will watch your condition.  Will get help right away if you are not doing well or get worse. Document Released: 03/06/2005 Document Revised: 11/29/2011 Document Reviewed: 10/11/2011 [employer] Patient Information 2014 [employer].  

## 2012-11-25 NOTE — Progress Notes (Signed)
P-89 

## 2012-11-25 NOTE — Progress Notes (Signed)
Low lying placenta is resolved.  Infant is breech again.--does not desire ECV until closer to due date if at all.

## 2012-11-25 NOTE — Addendum Note (Signed)
Addended by: Barbara Cower on: 11/25/2012 04:25 PM   Modules accepted: Orders

## 2012-11-26 LAB — GC/CHLAMYDIA PROBE AMP: GC Probe RNA: NEGATIVE

## 2012-11-26 LAB — OB RESULTS CONSOLE GC/CHLAMYDIA
Chlamydia: NEGATIVE
Gonorrhea: NEGATIVE

## 2012-11-28 ENCOUNTER — Encounter: Payer: Self-pay | Admitting: Family Medicine

## 2012-12-03 ENCOUNTER — Ambulatory Visit (INDEPENDENT_AMBULATORY_CARE_PROVIDER_SITE_OTHER): Payer: 59 | Admitting: Obstetrics & Gynecology

## 2012-12-03 VITALS — BP 120/86 | Wt 175.0 lb

## 2012-12-03 DIAGNOSIS — O321XX0 Maternal care for breech presentation, not applicable or unspecified: Secondary | ICD-10-CM

## 2012-12-03 DIAGNOSIS — Z3483 Encounter for supervision of other normal pregnancy, third trimester: Secondary | ICD-10-CM

## 2012-12-03 DIAGNOSIS — Z3480 Encounter for supervision of other normal pregnancy, unspecified trimester: Secondary | ICD-10-CM

## 2012-12-03 DIAGNOSIS — O34219 Maternal care for unspecified type scar from previous cesarean delivery: Secondary | ICD-10-CM

## 2012-12-03 DIAGNOSIS — O321XX1 Maternal care for breech presentation, fetus 1: Secondary | ICD-10-CM

## 2012-12-03 NOTE — Patient Instructions (Signed)
Version scheduled 12/11/12 at 7 am with Dr. Debroah Loop  External Cephalic Version External cephalic version is turning a baby that is presenting their buttocks first (breech) or is lying sideways in the uterus (transverse) to a head-first position. This makes the labor and delivery faster, safer for the mother and baby, and lessens the chance for a Cesarean section. It should not be tried until the pregnancy is [redacted] weeks along or longer. BEFORE THE PROCEDURE   Do not take aspirin.  Do not eat for 4 hours before the procedure.  Tell your caregiver if you have a cold, fever or an infection.  Tell your caregiver if you are having contractions.  Tell your caregiver if you are leaking or had a gush of fluid from your vagina.  Tell your caregiver if you have any vaginal bleeding or abnormal discharge.  If you are being admitted the same day, arrive at the hospital at least one hour before the procedure to sign any necessary documents and to get prepared for the procedure.  Tell your caregiver if you had any problems with anesthetics in the past.  Tell your caregiver if you are taking any medications that your caregiver does not know about. This includes over-the-counter and prescription drugs, herbs, eye drops and creams. PROCEDURE  First, an ultrasound is done to make sure the baby is breech or transverse.  A non-stress test or biophysical profile is done on the baby before the ECV. This is done to make sure it is safe for the baby to have the ECV. It may also be done after the procedure to make sure the baby is OK.  ECV is done in the delivery/surgical room with an anesthesiologist present. There should be a setup for an emergency Cesarean section with a full nursing and nursery staff available and ready.  The patient may be given a medication to relax the uterine muscles. An epidural may be given for any discomfort. It is helpful for the success of the ECV.  An electronic fetal monitor is  placed on the uterus during the procedure to make sure the baby is OK.  If the mother is Rh negative, Rho-gam will be given to her to prevent Rh problems for future pregnancies.  The mother is followed closely for 2 to 3 hours after the procedure to make sure no problems develop. BENEFITS OF ECV  Easier and safer labor and delivery for the mother and baby.  Lower incidence of Cesarean section.  Lower costs with a vaginal delivery. RISKS OF ECV  The placenta pulls away from the wall of the uterus before delivery (abruption of the placenta).  Rupture of the uterus, especially in patients with a previous Cesarean section.  Fetal distress.  Early (premature) labor.  Premature rupture of the membranes.  The baby will return to the breech or transverse lie position.  Death of the fetus can happen, but is very rare. ECV SHOULD BE STOPPED IF:  The fetal heart tones drop.  The mother is having a lot of pain.  You cannot turn the baby after several attempts. ECV SHOULD NOT BE DONE IF:  The non-stress test or biophysical profile is abnormal.  There is vaginal bleeding.  An abnormal shaped uterus is present.  There is heart disease or uncontrolled high blood pressure in the mother.  There are twins or more.  The placenta covers the opening of the cervix (placenta previa).  You had a previous cesarean section with a classical incision or major  surgery of the uterus.  There is not enough amniotic fluid in the sac (oligohydramnios).  The baby is too small for the pregnancy or has not developed normally (anomaly).  Your membranes have ruptured. HOME CARE INSTRUCTIONS   Have someone take you home after the procedure.  Rest at home for several hours.  Have someone stay with you for a few hours after you get home.  After ECV, continue with your prenatal visits as directed.  Continue your regular diet, rest and activities.  Do not do any strenuous activities for a  couple of days. SEEK IMMEDIATE MEDICAL CARE IF:   You develop vaginal bleeding.  You have fluid coming out of your vagina (bag of water may have broken).  You develop uterine contractions.  You do not feel the baby move or there is less movement of the baby.  You develop abdominal pain.  You develop an oral temperature of 102 F (38.9 C) or higher. Document Released: 08/29/2006 Document Revised: 05/29/2011 Document Reviewed: 06/24/2008 [facility] Patient Information 2014 [place], [place].

## 2012-12-03 NOTE — Progress Notes (Signed)
Cultures negative.  Patient desires ECV, risks reviewed.  ECV booked 12/11/12 with Dr. Debroah Loop.  No other complaints or concerns.  Fetal movement and labor precautions reviewed.

## 2012-12-09 ENCOUNTER — Encounter (HOSPITAL_COMMUNITY): Payer: Self-pay | Admitting: *Deleted

## 2012-12-09 ENCOUNTER — Telehealth (HOSPITAL_COMMUNITY): Payer: Self-pay | Admitting: *Deleted

## 2012-12-09 NOTE — Telephone Encounter (Signed)
Preadmission screen  

## 2012-12-10 ENCOUNTER — Ambulatory Visit (INDEPENDENT_AMBULATORY_CARE_PROVIDER_SITE_OTHER): Payer: 59 | Admitting: Family Medicine

## 2012-12-10 VITALS — BP 131/80 | Wt 177.0 lb

## 2012-12-10 DIAGNOSIS — Z3480 Encounter for supervision of other normal pregnancy, unspecified trimester: Secondary | ICD-10-CM

## 2012-12-10 DIAGNOSIS — O321XX1 Maternal care for breech presentation, fetus 1: Secondary | ICD-10-CM

## 2012-12-10 DIAGNOSIS — O321XX0 Maternal care for breech presentation, not applicable or unspecified: Secondary | ICD-10-CM

## 2012-12-10 NOTE — Progress Notes (Signed)
For ECV tomorrow.

## 2012-12-10 NOTE — Progress Notes (Signed)
P = 91 

## 2012-12-10 NOTE — Assessment & Plan Note (Signed)
Continue routine prenatal care.  

## 2012-12-10 NOTE — Patient Instructions (Addendum)
Pregnancy - Third Trimester The third trimester of pregnancy (the last 3 months) is a period of the most rapid growth for you and your baby. The baby approaches a length of 20 inches and a weight of 6 to 10 pounds. The baby is adding on fat and getting ready for life outside your body. While inside, babies have periods of sleeping and waking, sucking thumbs, and hiccuping. You can often feel small contractions of the uterus. This is false labor. It is also called Braxton-Hicks contractions. This is like a practice for labor. The usual problems in this stage of pregnancy include more difficulty breathing, swelling of the hands and feet from water retention, and having to urinate more often because of the uterus and baby pressing on your bladder.  PRENATAL EXAMS  Blood work may continue to be done during prenatal exams. These tests are done to check on your health and the probable health of your baby. Blood work is used to follow your blood levels (hemoglobin). Anemia (low hemoglobin) is common during pregnancy. Iron and vitamins are given to help prevent this. You may also continue to be checked for diabetes. Some of the past blood tests may be done again.  The size of the uterus is measured during each visit. This makes sure your baby is growing properly according to your pregnancy dates.  Your blood pressure is checked every prenatal visit. This is to make sure you are not getting toxemia.  Your urine is checked every prenatal visit for infection, diabetes, and protein.  Your weight is checked at each visit. This is done to make sure gains are happening at the suggested rate and that you and your baby are growing normally.  Sometimes, an ultrasound is performed to confirm the position and the proper growth and development of the baby. This is a test done that bounces harmless sound waves off the baby so your caregiver can more accurately determine a due date.  Discuss the type of pain medicine and  anesthesia you will have during your labor and delivery.  Discuss the possibility and anesthesia if a cesarean section might be necessary.  Inform your caregiver if there is any mental or physical violence at home. Sometimes, a specialized non-stress test, contraction stress test, and biophysical profile are done to make sure the baby is not having a problem. Checking the amniotic fluid surrounding the baby is called an amniocentesis. The amniotic fluid is removed by sticking a needle into the belly (abdomen). This is sometimes done near the end of pregnancy if an early delivery is required. In this case, it is done to help make sure the baby's lungs are mature enough for the baby to live outside of the womb. If the lungs are not mature and it is unsafe to deliver the baby, an injection of cortisone medicine is given to the mother 1 to 2 days before the delivery. This helps the baby's lungs mature and makes it safer to deliver the baby. CHANGES OCCURING IN THE THIRD TRIMESTER OF PREGNANCY Your body goes through many changes during pregnancy. They vary from person to person. Talk to your caregiver about changes you notice and are concerned about.  During the last trimester, you have probably had an increase in your appetite. It is normal to have cravings for certain foods. This varies from person to person and pregnancy to pregnancy.  You may begin to get stretch marks on your hips, abdomen, and breasts. These are normal changes in the body   during pregnancy. There are no exercises or medicines to take which prevent this change.  Constipation may be treated with a stool softener or adding bulk to your diet. Drinking lots of fluids, fiber in vegetables, fruits, and whole grains are helpful.  Exercising is also helpful. If you have been very active up until your pregnancy, most of these activities can be continued during your pregnancy. If you have been less active, it is helpful to start an exercise  program such as walking. Consult your caregiver before starting exercise programs.  Avoid all smoking, alcohol, non-prescribed drugs, herbs and "street drugs" during your pregnancy. These chemicals affect the formation and growth of the baby. Avoid chemicals throughout the pregnancy to ensure the delivery of a healthy infant.  Backache, varicose veins, and hemorrhoids may develop or get worse.  You will tire more easily in the third trimester, which is normal.  The baby's movements may be stronger and more often.  You may become short of breath easily.  Your belly button may stick out.  A yellow discharge may leak from your breasts called colostrum.  You may have a bloody mucus discharge. This usually occurs a few days to a week before labor begins. HOME CARE INSTRUCTIONS   Keep your caregiver's appointments. Follow your caregiver's instructions regarding medicine use, exercise, and diet.  During pregnancy, you are providing food for you and your baby. Continue to eat regular, well-balanced meals. Choose foods such as meat, fish, milk and other low fat dairy products, vegetables, fruits, and whole-grain breads and cereals. Your caregiver will tell you of the ideal weight gain.  A physical sexual relationship may be continued throughout pregnancy if there are no other problems such as early (premature) leaking of amniotic fluid from the membranes, vaginal bleeding, or belly (abdominal) pain.  Exercise regularly if there are no restrictions. Check with your caregiver if you are unsure of the safety of your exercises. Greater weight gain will occur in the last 2 trimesters of pregnancy. Exercising helps:  Control your weight.  Get you in shape for labor and delivery.  You lose weight after you deliver.  Rest a lot with legs elevated, or as needed for leg cramps or low back pain.  Wear a good support or jogging bra for breast tenderness during pregnancy. This may help if worn during  sleep. Pads or tissues may be used in the bra if you are leaking colostrum.  Do not use hot tubs, steam rooms, or saunas.  Wear your seat belt when driving. This protects you and your baby if you are in an accident.  Avoid raw meat, cat litter boxes and soil used by cats. These carry germs that can cause birth defects in the baby.  It is easier to leak urine during pregnancy. Tightening up and strengthening the pelvic muscles will help with this problem. You can practice stopping your urination while you are going to the bathroom. These are the same muscles you need to strengthen. It is also the muscles you would use if you were trying to stop from passing gas. You can practice tightening these muscles up 10 times a set and repeating this about 3 times per day. Once you know what muscles to tighten up, do not perform these exercises during urination. It is more likely to cause an infection by backing up the urine.  Ask for help if you have financial, counseling, or nutritional needs during pregnancy. Your caregiver will be able to offer counseling for these   needs as well as refer you for other special needs.  Make a list of emergency phone numbers and have them available.  Plan on getting help from family or friends when you go home from the hospital.  Make a trial run to the hospital.  Take prenatal classes with the father to understand, practice, and ask questions about the labor and delivery.  Prepare the baby's room or nursery.  Do not travel out of the city unless it is absolutely necessary and with the advice of your caregiver.  Wear only low or no heal shoes to have better balance and prevent falling. MEDICINES AND DRUG USE IN PREGNANCY  Take prenatal vitamins as directed. The vitamin should contain 1 milligram of folic acid. Keep all vitamins out of reach of children. Only a couple vitamins or tablets containing iron may be fatal to a baby or young child when ingested.  Avoid use  of all medicines, including herbs, over-the-counter medicines, not prescribed or suggested by your caregiver. Only take over-the-counter or prescription medicines for pain, discomfort, or fever as directed by your caregiver. Do not use aspirin, ibuprofen or naproxen unless approved by your caregiver.  Let your caregiver also know about herbs you may be using.  Alcohol is related to a number of birth defects. This includes fetal alcohol syndrome. All alcohol, in any form, should be avoided completely. Smoking will cause low birth rate and premature babies.  Illegal drugs are very harmful to the baby. They are absolutely forbidden. A baby born to an addicted mother will be addicted at birth. The baby will go through the same withdrawal an adult does. SEEK MEDICAL CARE IF: You have any concerns or worries during your pregnancy. It is better to call with your questions if you feel they cannot wait, rather than worry about them. SEEK IMMEDIATE MEDICAL CARE IF:   An unexplained oral temperature above 102 F (38.9 C) develops, or as your caregiver suggests.  You have leaking of fluid from the vagina. If leaking membranes are suspected, take your temperature and tell your caregiver of this when you call.  There is vaginal spotting, bleeding or passing clots. Tell your caregiver of the amount and how many pads are used.  You develop a bad smelling vaginal discharge with a change in the color from clear to white.  You develop vomiting that lasts more than 24 hours.  You develop chills or fever.  You develop shortness of breath.  You develop burning on urination.  You loose more than 2 pounds of weight or gain more than 2 pounds of weight or as suggested by your caregiver.  You notice sudden swelling of your face, hands, and feet or legs.  You develop belly (abdominal) pain. Round ligament discomfort is a common non-cancerous (benign) cause of abdominal pain in pregnancy. Your caregiver still  must evaluate you.  You develop a severe headache that does not go away.  You develop visual problems, blurred or double vision.  If you have not felt your baby move for more than 1 hour. If you think the baby is not moving as much as usual, eat something with sugar in it and lie down on your left side for an hour. The baby should move at least 4 to 5 times per hour. Call right away if your baby moves less than that.  You fall, are in a car accident, or any kind of trauma.  There is mental or physical violence at home. Document Released: 02/28/2001   Document Revised: 11/29/2011 Document Reviewed: 09/02/2008 [employer] Patient Information 2014 [employer].  Breastfeeding A change in hormones during your pregnancy causes growth of your breast tissue and an increase in number and size of milk ducts. The hormone prolactin allows proteins, sugars, and fats from your blood supply to make breast milk in your milk-producing glands. The hormone progesterone prevents breast milk from being released before the birth of your baby. After the birth of your baby, your progesterone level decreases allowing breast milk to be released. Thoughts of your baby, as well as his or her sucking or crying, can stimulate the release of milk from the milk-producing glands. Deciding to breastfeed (nurse) is one of the best choices you can make for you and your baby. The information that follows gives a brief review of the benefits, as well as other important skills to know about breastfeeding. BENEFITS OF BREASTFEEDING For your baby  The first milk (colostrum) helps your baby's digestive system function better.   There are antibodies in your milk that help your baby fight off infections.   Your baby has a lower incidence of asthma, allergies, and sudden infant death syndrome (SIDS).   The nutrients in breast milk are better for your baby than infant formulas.  Breast milk improves your baby's brain development.    Your baby will have less gas, colic, and constipation.  Your baby is less likely to develop other conditions, such as childhood obesity, asthma, or diabetes mellitus. For you  Breastfeeding helps develop a very special bond between you and your baby.   Breastfeeding is convenient, always available at the correct temperature, and costs nothing.   Breastfeeding helps to burn calories and helps you lose the weight gained during pregnancy.   Breastfeeding makes your uterus contract back down to normal size faster and slows bleeding following delivery.   Breastfeeding mothers have a lower risk of developing osteoporosis or breast or ovarian cancer later in life.  BREASTFEEDING FREQUENCY  A healthy, full-term baby may breastfeed as often as every hour or space his or her feedings to every 3 hours. Breastfeeding frequency will vary from baby to baby.   Newborns should be fed no less than every 2 3 hours during the day and every 4 5 hours during the night. You should breastfeed a minimum of 8 feedings in a 24 hour period.  Awaken your baby to breastfeed if it has been 3 4 hours since the last feeding.  Breastfeed when you feel the need to reduce the fullness of your breasts or when your newborn shows signs of hunger. Signs that your baby may be hungry include:  Increased alertness or activity.  Stretching.  Movement of the head from side to side.  Movement of the head and opening of the mouth when the corner of the mouth or cheek is stroked (rooting).  Increased sucking sounds, smacking lips, cooing, sighing, or squeaking.  Hand-to-mouth movements.  Increased sucking of fingers or hands.  Fussing.  Intermittent crying.  Signs of extreme hunger will require calming and consoling before you try to feed your baby. Signs of extreme hunger may include:  Restlessness.  A loud, strong cry.  Screaming.  Frequent feeding will help you make more milk and will help prevent  problems, such as sore nipples and engorgement of the breasts.  BREASTFEEDING   Whether lying down or sitting, be sure that the baby's abdomen is facing your abdomen.   Support your breast with 4 fingers under your breast   and your thumb above your nipple. Make sure your fingers are well away from your nipple and your baby's mouth.   Stroke your baby's lips gently with your finger or nipple.   When your baby's mouth is open wide enough, place all of your nipple and as much of the colored area around your nipple (areola) as possible into your baby's mouth.  More areola should be visible above his or her upper lip than below his or her lower lip.  Your baby's tongue should be between his or her lower gum and your breast.  Ensure that your baby's mouth is correctly positioned around the nipple (latched). Your baby's lips should create a seal on your breast.  Signs that your baby has effectively latched onto your nipple include:  Tugging or sucking without pain.  Swallowing heard between sucks.  Absent click or smacking sound.  Muscle movement above and in front of his or her ears with sucking.  Your baby must suck about 2 3 minutes in order to get your milk. Allow your baby to feed on each breast as long as he or she wants. Nurse your baby until he or she unlatches or falls asleep at the first breast, then offer the second breast.  Signs that your baby is full and satisfied include:  A gradual decrease in the number of sucks or complete cessation of sucking.  Falling asleep.  Extension or relaxation of his or her body.  Retention of a small amount of milk in his or her mouth.  Letting go of your breast by himself or herself.  Signs of effective breastfeeding in you include:  Breasts that have increased firmness, weight, and size prior to feeding.  Breasts that are softer after nursing.  Increased milk volume, as well as a change in milk consistency and color by the 5th  day of breastfeeding.  Breast fullness relieved by breastfeeding.  Nipples are not sore, cracked, or bleeding.  If needed, break the suction by putting your finger into the corner of your baby's mouth and sliding your finger between his or her gums. Then, remove your breast from his or her mouth.  It is common for babies to spit up a small amount after a feeding.  Babies often swallow air during feeding. This can make babies fussy. Burping your baby between breasts can help with this.  Vitamin D supplements are recommended for babies who get only breast milk.  Avoid using a pacifier during your baby's first 4 6 weeks.  Avoid supplemental feedings of water, formula, or juice in place of breastfeeding. Breast milk is all the food your baby needs. It is not necessary for your baby to have water or formula. Your breasts will make more milk if supplemental feedings are avoided during the early weeks. HOW TO TELL WHETHER YOUR BABY IS GETTING ENOUGH BREAST MILK Wondering whether or not your baby is getting enough milk is a common concern among mothers. You can be assured that your baby is getting enough milk if:   Your baby is actively sucking and you hear swallowing.   Your baby seems relaxed and satisfied after a feeding.   Your baby nurses at least 8 12 times in a 24 hour time period.  During the first 3 5 days of age:  Your baby is wetting at least 3 5 diapers in a 24 hour period. The urine should be clear and pale yellow.  Your baby is having at least 3 4 stools in   a 24 hour period. The stool should be soft and yellow.  At 5 7 days of age, your baby is having at least 3 6 stools in a 24 hour period. The stool should be seedy and yellow by 5 days of age.  Your baby has a weight loss less than 7 10% during the first 3 days of age.  Your baby does not lose weight after 3 7 days of age.  Your baby gains 4 7 ounces each week after he or she is 4 days of age.  Your baby gains weight  by 5 days of age and is back to birth weight within 2 weeks. ENGORGEMENT In the first week after your baby is born, you may experience extremely full breasts (engorgement). When engorged, your breasts may feel heavy, warm, or tender to the touch. Engorgement peaks within 24 48 hours after delivery of your baby.  Engorgement may be reduced by:  Continuing to breastfeed.  Increasing the frequency of breastfeeding.  Taking warm showers or applying warm, moist heat to your breasts just before each feeding. This increases circulation and helps the milk flow.   Gently massaging your breast before and during the feedings. With your fingertips, massage from your chest wall towards your nipple in a circular motion.   Ensuring that your baby empties at least one breast at every feeding. It also helps to start the next feeding on the opposite breast.   Expressing breast milk by hand or by using a breast pump to empty the breasts if your baby is sleepy, or not nursing well. You may also want to express milk if you are returning to work oryou feel you are getting engorged.  Ensuring your baby is latched on and positioned properly while breastfeeding. If you follow these suggestions, your engorgement should improve in 24 48 hours. If you are still experiencing difficulty, call your lactation consultant or caregiver.  CARING FOR YOURSELF Take care of your breasts.  Bathe or shower daily.   Avoid using soap on your nipples.   Wear a supportive bra. Avoid wearing underwire style bras.  Air dry your nipples for a 3 4minutes after each feeding.   Use only cotton bra pads to absorb breast milk leakage. Leaking of breast milk between feedings is normal.   Use only pure lanolin on your nipples after nursing. You do not need to wash it off before feeding your baby again. Another option is to express a few drops of breast milk and gently massage that milk into your nipples.  Continue breast  self-awareness checks. Take care of yourself.  Eat healthy foods. Alternate 3 meals with 3 snacks.  Avoid foods that you notice affect your baby in a bad way.  Drink milk, fruit juice, and water to satisfy your thirst (about 8 glasses a day).   Rest often, relax, and take your prenatal vitamins to prevent fatigue, stress, and anemia.  Avoid chewing and smoking tobacco.  Avoid alcohol and drug use.  Take over-the-counter and prescribed medicine only as directed by your caregiver or pharmacist. You should always check with your caregiver or pharmacist before taking any new medicine, vitamin, or herbal supplement.  Know that pregnancy is possible while breastfeeding. If desired, talk to your caregiver about family planning and safe birth control methods that may be used while breastfeeding. SEEK MEDICAL CARE IF:   You feel like you want to stop breastfeeding or have become frustrated with breastfeeding.  You have painful breasts or nipples.    Your nipples are cracked or bleeding.  Your breasts are red, tender, or warm.  You have a swollen area on either breast.  You have a fever or chills.  You have nausea or vomiting.  You have drainage from your nipples.  Your breasts do not become full before feedings by the 5th day after delivery.  You feel sad and depressed.  Your baby is too sleepy to eat well.  Your baby is having trouble sleeping.   Your baby is wetting less than 3 diapers in a 24 hour period.  Your baby has less than 3 stools in a 24 hour period.  Your baby's skin or the white part of his or her eyes becomes more yellow.   Your baby is not gaining weight by 33 days of age. MAKE SURE YOU:   Understand these instructions.  Will watch your condition.  Will get help right away if you are not doing well or get worse. Document Released: 03/06/2005 Document Revised: 11/29/2011 Document Reviewed: 10/11/2011 [facility] Patient Information 2014 [place],  [place]. External Cephalic Version External cephalic version is turning a baby that is presenting their buttocks first (breech) or is lying sideways in the uterus (transverse) to a head-first position. This makes the labor and delivery faster, safer for the mother and baby, and lessens the chance for a Cesarean section. It should not be tried until the pregnancy is [redacted] weeks along or longer. BEFORE THE PROCEDURE   Do not take aspirin.  Do not eat for 4 hours before the procedure.  Tell your caregiver if you have a cold, fever or an infection.  Tell your caregiver if you are having contractions.  Tell your caregiver if you are leaking or had a gush of fluid from your vagina.  Tell your caregiver if you have any vaginal bleeding or abnormal discharge.  If you are being admitted the same day, arrive at the hospital at least one hour before the procedure to sign any necessary documents and to get prepared for the procedure.  Tell your caregiver if you had any problems with anesthetics in the past.  Tell your caregiver if you are taking any medications that your caregiver does not know about. This includes over-the-counter and prescription drugs, herbs, eye drops and creams. PROCEDURE  First, an ultrasound is done to make sure the baby is breech or transverse.  A non-stress test or biophysical profile is done on the baby before the ECV. This is done to make sure it is safe for the baby to have the ECV. It may also be done after the procedure to make sure the baby is OK.  ECV is done in the delivery/surgical room with an anesthesiologist present. There should be a setup for an emergency Cesarean section with a full nursing and nursery staff available and ready.  The patient may be given a medication to relax the uterine muscles. An epidural may be given for any discomfort. It is helpful for the success of the ECV.  An electronic fetal monitor is placed on the uterus during the procedure to make  sure the baby is OK.  If the mother is Rh negative, Rho-gam will be given to her to prevent Rh problems for future pregnancies.  The mother is followed closely for 2 to 3 hours after the procedure to make sure no problems develop. BENEFITS OF ECV  Easier and safer labor and delivery for the mother and baby.  Lower incidence of Cesarean section.  Lower costs with  a vaginal delivery. RISKS OF ECV  The placenta pulls away from the wall of the uterus before delivery (abruption of the placenta).  Rupture of the uterus, especially in patients with a previous Cesarean section.  Fetal distress.  Early (premature) labor.  Premature rupture of the membranes.  The baby will return to the breech or transverse lie position.  Death of the fetus can happen, but is very rare. ECV SHOULD BE STOPPED IF:  The fetal heart tones drop.  The mother is having a lot of pain.  You cannot turn the baby after several attempts. ECV SHOULD NOT BE DONE IF:  The non-stress test or biophysical profile is abnormal.  There is vaginal bleeding.  An abnormal shaped uterus is present.  There is heart disease or uncontrolled high blood pressure in the mother.  There are twins or more.  The placenta covers the opening of the cervix (placenta previa).  You had a previous cesarean section with a classical incision or major surgery of the uterus.  There is not enough amniotic fluid in the sac (oligohydramnios).  The baby is too small for the pregnancy or has not developed normally (anomaly).  Your membranes have ruptured. HOME CARE INSTRUCTIONS   Have someone take you home after the procedure.  Rest at home for several hours.  Have someone stay with you for a few hours after you get home.  After ECV, continue with your prenatal visits as directed.  Continue your regular diet, rest and activities.  Do not do any strenuous activities for a couple of days. SEEK IMMEDIATE MEDICAL CARE IF:    You develop vaginal bleeding.  You have fluid coming out of your vagina (bag of water may have broken).  You develop uterine contractions.  You do not feel the baby move or there is less movement of the baby.  You develop abdominal pain.  You develop an oral temperature of 102 F (38.9 C) or higher. Document Released: 08/29/2006 Document Revised: 05/29/2011 Document Reviewed: 06/24/2008 [facility] Patient Information 2014 [place], [place].

## 2012-12-11 ENCOUNTER — Inpatient Hospital Stay (HOSPITAL_COMMUNITY)
Admission: RE | Admit: 2012-12-11 | Discharge: 2012-12-11 | DRG: 782 | Disposition: A | Discharge status: Home / Self Care | Payer: 59 | Source: Ambulatory Visit | Attending: Obstetrics & Gynecology | Admitting: Obstetrics & Gynecology

## 2012-12-11 VITALS — BP 124/77 | HR 92 | Temp 97.9°F | Resp 18

## 2012-12-11 DIAGNOSIS — O321XX1 Maternal care for breech presentation, fetus 1: Secondary | ICD-10-CM

## 2012-12-11 DIAGNOSIS — O321XX0 Maternal care for breech presentation, not applicable or unspecified: Principal | ICD-10-CM | POA: Diagnosis present

## 2012-12-11 MED ORDER — TERBUTALINE SULFATE 1 MG/ML IJ SOLN
INTRAMUSCULAR | Status: AC
  Administered 2012-12-11: 0.25 mg via SUBCUTANEOUS
  Filled 2012-12-11: qty 1

## 2012-12-11 MED ORDER — TERBUTALINE SULFATE 1 MG/ML IJ SOLN: 0.2500 mg | Freq: Once | INTRAMUSCULAR | Status: DC

## 2012-12-11 MED ORDER — LACTATED RINGERS IV SOLN
INTRAVENOUS | Status: DC
  Administered 2012-12-11: 08:00:00 via INTRAVENOUS

## 2012-12-11 NOTE — Progress Notes (Signed)
External version performed by Dr Debroah Loop. Successful

## 2012-12-11 NOTE — Progress Notes (Signed)
ECV performed under US guidance and with good maternal and fetal tolerance. FHR 140s, no decels. OK for discharge with f/u at [facility] next week.  Adam Phenix, MD 12/11/2012 10:15 AM

## 2012-12-11 NOTE — Progress Notes (Signed)
Pt discharged home states good understanding of D/C instructions

## 2012-12-11 NOTE — H&P (Signed)
[name] is a 27 y.o. female presenting for ECV for breech . Maternal Medical History:  Reason for admission: Nausea. ECV for breech presentation scheduled by Dr. Shawnie Pons  Contractions: Frequency: rare.    Fetal activity: Perceived fetal activity is normal.    Prenatal Complications - Diabetes: none.    OB History   Grav Para Term Preterm Abortions TAB SAB Ect Mult Living   3 2 2  0 0 0 0 0 0 2     Past Medical History  Diagnosis Date  . Rh negative state in antepartum period   . Abnormal Pap smear   . No pertinent past medical history    Past Surgical History  Procedure Laterality Date  . Tonsillectomy      27 yrs old  . Cesarean section  10/07/2011    Procedure: CESAREAN SECTION;  Surgeon: Willodean Rosenthal, MD;  Location: [facility];  Service: Obstetrics;  Laterality: N/A;  . Dilation and curettage of uterus     Family History: family history includes Diabetes in her maternal grandfather and maternal grandmother. There is no history of Anesthesia problems, Hypotension, Malignant hyperthermia, or Pseudochol deficiency. Social History:  reports that she has never smoked. She has never used smokeless tobacco. She reports that she does not drink alcohol or use illicit drugs.   Prenatal Transfer Tool  Maternal Diabetes: No Genetic Screening: Normal Maternal Ultrasounds/Referrals: Normal Fetal Ultrasounds or other Referrals:  None Maternal Substance Abuse:  No Significant Maternal Medications:  None Significant Maternal Lab Results:  None Other Comments:  None  Review of Systems  Constitutional: Negative for fever.  Respiratory: Negative for cough.   Gastrointestinal: Negative for nausea and abdominal pain.  Genitourinary: Negative for dysuria.      Blood pressure 118/77, pulse 80, temperature 97.9 F (36.6 C), temperature source Oral, resp. rate 18, last menstrual period 03/21/2012. Maternal Exam:  Uterine Assessment: Contraction strength is mild.  Contraction  frequency is rare.   Abdomen: Surgical scars: low transverse.   Fundal height is 37 cm.   Estimated fetal weight is 6.5 lb.   Fetal presentation: breech  Introitus: not evaluated.   Cervix: not evaluated.   Physical Exam  Constitutional: She is oriented to person, place, and time. She appears well-developed and well-nourished. No distress.  Respiratory: Effort normal. No respiratory distress.  GI: Soft. There is no tenderness.  Musculoskeletal: She exhibits no edema.  Neurological: She is alert and oriented to person, place, and time.  Skin: Skin is warm and dry.  Psychiatric: She has a normal mood and affect. Her behavior is normal.    Prenatal labs: ABO, Rh: O/NEG/-- (02/03 1207) Antibody: NEG (07/08 0946) Rubella: 12.00 (02/03 1207) RPR: NON REAC (07/08 0946)  HBsAg: NEGATIVE (02/03 1207)  HIV: NON REACTIVE (07/08 0946)  GBS: Negative (09/08 0000)   Assessment/Plan: Breech presentation, previous cesarean section and previous SVD, wants ECV and TOLAC. The procedure and the risk of failure, need for delivery, labor, ROM, pain, bleeding discussed and her questions were answered.   [name] 12/11/2012, 9:16 AM

## 2012-12-16 ENCOUNTER — Encounter: Payer: Self-pay | Admitting: Obstetrics & Gynecology

## 2012-12-16 ENCOUNTER — Ambulatory Visit (INDEPENDENT_AMBULATORY_CARE_PROVIDER_SITE_OTHER): Payer: 59 | Admitting: Obstetrics & Gynecology

## 2012-12-16 VITALS — BP 127/74 | Wt 177.0 lb

## 2012-12-16 DIAGNOSIS — O34219 Maternal care for unspecified type scar from previous cesarean delivery: Secondary | ICD-10-CM

## 2012-12-16 DIAGNOSIS — Z3483 Encounter for supervision of other normal pregnancy, third trimester: Secondary | ICD-10-CM

## 2012-12-16 DIAGNOSIS — Z3480 Encounter for supervision of other normal pregnancy, unspecified trimester: Secondary | ICD-10-CM

## 2012-12-16 NOTE — Progress Notes (Signed)
p-89 

## 2012-12-16 NOTE — Progress Notes (Signed)
ECV successful on 12/11/12, still cephalic today on exam.  Membranes swept, was only able to use one digit. No other complaints or concerns.  Fetal movement and labor precautions reviewed.

## 2012-12-16 NOTE — Patient Instructions (Signed)
Return to clinic for any obstetric concerns or go to [facility] for evaluation  

## 2012-12-17 ENCOUNTER — Ambulatory Visit (INDEPENDENT_AMBULATORY_CARE_PROVIDER_SITE_OTHER): Payer: 59 | Admitting: Obstetrics & Gynecology

## 2012-12-17 ENCOUNTER — Encounter: Payer: Self-pay | Admitting: Obstetrics & Gynecology

## 2012-12-17 DIAGNOSIS — Z3480 Encounter for supervision of other normal pregnancy, unspecified trimester: Secondary | ICD-10-CM

## 2012-12-17 DIAGNOSIS — O34219 Maternal care for unspecified type scar from previous cesarean delivery: Secondary | ICD-10-CM

## 2012-12-17 NOTE — Progress Notes (Signed)
She is here today because of 2 reasons. The first is that she is concerned that the baby may be breech again. I did an u/s and confirmed VTX lie. She also tells me and Inetta Fermo that she has changed her mind about TOLAC. She wants RLTCS at 39 weeks. She wants a PPS during this surgery. I spoke with [facility] about scheduling this for 39 weeks.

## 2012-12-17 NOTE — Patient Instructions (Signed)
Cesarean Delivery  Care After  Refer to this sheet in the next few weeks. These instructions provide you with information on caring for yourself after your procedure. Your caregiver may also give you specific instructions. Your treatment has been planned according to current medical practices, but problems sometimes occur. Call your caregiver if you have any problems or questions after you go home.  HOME CARE INSTRUCTIONS   · Only take over-the-counter or prescription medicines as directed by your caregiver.  · Do not drink alcohol, especially if you are breastfeeding or taking medicine to relieve pain.  · Do not chew or smoke tobacco.  · Continue to use good perineal care. Good perineal care includes:  · Wiping your perineum from front to back.  · Keeping your perineum clean.  · Check your cut (incision) daily for increased redness, drainage, swelling, or separation of skin.  · Clean your incision gently with soap and water every day, and then pat it dry. If your caregiver says it is okay, leave the incision uncovered. Use a bandage (dressing) if the incision is draining fluid or appears irritated. If the adhesive strips across the incision do not fall off within 7 days, carefully peel them off.  · Hug a pillow when coughing or sneezing until your incision is healed. This helps to relieve pain.  · Do not use tampons or douche until your caregiver says it is okay.  · Shower, wash your hair, and take tub baths as directed by your caregiver.  · Wear a well-fitting bra that provides breast support.  · Limit wearing support panties or control-top hose.  · Drink enough fluids to keep your urine clear or pale yellow.  · Eat high-fiber foods such as whole grain cereals and breads, brown rice, beans, and fresh fruits and vegetables every day. These foods may help prevent or relieve constipation.  · Resume activities such as climbing stairs, driving, lifting, exercising, or traveling as directed by your caregiver.  · Talk to  your caregiver about resuming sexual activities. This is dependent upon your risk of infection, your rate of healing, and your comfort and desire to resume sexual activity.  · Try to have someone help you with your household activities and your newborn for at least a few days after you leave the hospital.  · Rest as much as possible. Try to rest or take a nap when your newborn is sleeping.  · Increase your activities gradually.  · Keep all of your scheduled postpartum appointments. It is very important to keep your scheduled follow-up appointments. At these appointments, your caregiver will be checking to make sure that you are healing physically and emotionally.  SEEK MEDICAL CARE IF:   · You are passing large clots from your vagina. Save any clots to show your caregiver.  · You have a foul smelling discharge from your vagina.  · You have trouble urinating.  · You are urinating frequently.  · You have pain when you urinate.  · You have a change in your bowel movements.  · You have increasing redness, pain, or swelling near your incision.  · You have pus draining from your incision.  · Your incision is separating.  · You have painful, hard, or reddened breasts.  · You have a severe headache.  · You have blurred vision or see spots.  · You feel sad or depressed.  · You have thoughts of hurting yourself or your newborn.  · You have questions about your care, the   care of your newborn, or medicines.  · You are dizzy or lightheaded.  · You have a rash.  · You have pain, redness, or swelling at the site of the removed intravenous access (IV) tube.  · You have nausea or vomiting.  · You stopped breastfeeding and have not had a menstrual period within 12 weeks of stopping.  · You are not breastfeeding and have not had a menstrual period within 12 weeks of delivery.  · You have a fever.  SEEK IMMEDIATE MEDICAL CARE IF:  · You have persistent pain.  · You have chest pain.  · You have shortness of breath.  · You faint.  · You  have leg pain.  · You have stomach pain.  · Your vaginal bleeding saturates 2 or more sanitary pads in 1 hour.  MAKE SURE YOU:   · Understand these instructions.  · Will watch your condition.  · Will get help right away if you are not doing well or get worse.  Document Released: 11/26/2001 Document Revised: 11/29/2011 Document Reviewed: 11/01/2011  [employer]® Patient Information ©2014 [employer].

## 2012-12-17 NOTE — Discharge Summary (Signed)
Obstetric Discharge Summary Reason for Admission: breech presentation Prenatal Procedures: ultrasound Intrapartum Procedures: n/a Postpartum Procedures: n/a Complications-Operative and Postpartum: not delivered, ECV successful Hemoglobin  Date Value Range Status  09/24/2012 11.3* 12.0 - 15.0 g/dL Final     HCT  Date Value Range Status  09/24/2012 32.4* 36.0 - 46.0 % Final    Physical Exam:  General: alert and cooperative Uterine Fundus: gravid c/w dates DVT Evaluation: No evidence of DVT seen on physical exam.  Discharge Diagnoses: 38w   Discharge Information: Date: Activity: unrestricted Diet: routine Medications: None Condition: improved Instructions: normal labor Discharge to: home Follow-up Information   [facility] In 5 days. (As needed as needed)    Specialty:  Obstetrics and Gynecology   Contact information:   [address] [place] [address] [phone]      Newborn Data: <<This patient has not yet delivered during this pregnancy.>> Adam Phenix, MD

## 2012-12-18 ENCOUNTER — Encounter (HOSPITAL_COMMUNITY): Payer: Self-pay | Admitting: Pharmacist

## 2012-12-19 ENCOUNTER — Ambulatory Visit (INDEPENDENT_AMBULATORY_CARE_PROVIDER_SITE_OTHER): Payer: 59 | Admitting: Obstetrics & Gynecology

## 2012-12-19 ENCOUNTER — Encounter (HOSPITAL_COMMUNITY)
Admission: AD | Disposition: A | Discharge status: Home / Self Care | Payer: Self-pay | Source: Ambulatory Visit | Attending: Obstetrics & Gynecology

## 2012-12-19 ENCOUNTER — Encounter (HOSPITAL_COMMUNITY): Payer: Self-pay | Admitting: Anesthesiology

## 2012-12-19 ENCOUNTER — Inpatient Hospital Stay (HOSPITAL_COMMUNITY): Payer: 59 | Admitting: Anesthesiology

## 2012-12-19 ENCOUNTER — Encounter (HOSPITAL_COMMUNITY): Payer: Self-pay | Admitting: *Deleted

## 2012-12-19 ENCOUNTER — Inpatient Hospital Stay (HOSPITAL_COMMUNITY): Admission: RE | Admit: 2012-12-19 | Payer: 59 | Source: Ambulatory Visit | Admitting: Obstetrics & Gynecology

## 2012-12-19 ENCOUNTER — Encounter: Payer: Self-pay | Admitting: Obstetrics & Gynecology

## 2012-12-19 ENCOUNTER — Inpatient Hospital Stay (HOSPITAL_COMMUNITY)
Admission: AD | Admit: 2012-12-19 | Discharge: 2012-12-21 | DRG: 765 | Disposition: A | Discharge status: Home / Self Care | Payer: 59 | Source: Ambulatory Visit | Attending: Obstetrics & Gynecology | Admitting: Obstetrics & Gynecology

## 2012-12-19 VITALS — BP 154/77 | Wt 176.0 lb

## 2012-12-19 DIAGNOSIS — Z3480 Encounter for supervision of other normal pregnancy, unspecified trimester: Secondary | ICD-10-CM

## 2012-12-19 DIAGNOSIS — O4400 Placenta previa specified as without hemorrhage, unspecified trimester: Secondary | ICD-10-CM

## 2012-12-19 DIAGNOSIS — O360990 Maternal care for other rhesus isoimmunization, unspecified trimester, not applicable or unspecified: Secondary | ICD-10-CM

## 2012-12-19 DIAGNOSIS — Z302 Encounter for sterilization: Secondary | ICD-10-CM

## 2012-12-19 DIAGNOSIS — O34219 Maternal care for unspecified type scar from previous cesarean delivery: Secondary | ICD-10-CM

## 2012-12-19 DIAGNOSIS — O360121 Maternal care for anti-D [Rh] antibodies, second trimester, fetus 1: Secondary | ICD-10-CM

## 2012-12-19 DIAGNOSIS — O360131 Maternal care for anti-D [Rh] antibodies, third trimester, fetus 1: Secondary | ICD-10-CM

## 2012-12-19 DIAGNOSIS — Z3483 Encounter for supervision of other normal pregnancy, third trimester: Secondary | ICD-10-CM

## 2012-12-19 LAB — CBC
HCT: 32.9 % — ABNORMAL LOW (ref 36.0–46.0)
Hemoglobin: 11.1 g/dL — ABNORMAL LOW (ref 12.0–15.0)
MCHC: 33.7 g/dL (ref 30.0–36.0)
MCV: 87 fL (ref 78.0–100.0)
Platelets: 175 10*3/uL (ref 150–400)
RDW: 14.7 % (ref 11.5–15.5)

## 2012-12-19 LAB — TYPE AND SCREEN: Antibody Screen: NEGATIVE

## 2012-12-19 SURGERY — Surgical Case
Anesthesia: Spinal | Site: Abdomen | Wound class: Clean Contaminated

## 2012-12-19 SURGERY — Surgical Case
Anesthesia: Regional | Laterality: Bilateral

## 2012-12-19 MED ORDER — OXYTOCIN 40 UNITS IN LACTATED RINGERS INFUSION - SIMPLE MED: 62.5000 mL/h | INTRAVENOUS | Status: DC

## 2012-12-19 MED ORDER — SCOPOLAMINE 1 MG/3DAYS TD PT72
1.0000 | MEDICATED_PATCH | Freq: Once | TRANSDERMAL | Status: DC
  Administered 2012-12-19: 1.5 mg via TRANSDERMAL

## 2012-12-19 MED ORDER — BUPIVACAINE IN DEXTROSE 0.75-8.25 % IT SOLN
INTRATHECAL | Status: DC | PRN
  Administered 2012-12-19: 1.5 mL via INTRATHECAL

## 2012-12-19 MED ORDER — LACTATED RINGERS IV SOLN
INTRAVENOUS | Status: DC | PRN
  Administered 2012-12-19: 22:00:00 via INTRAVENOUS

## 2012-12-19 MED ORDER — MEPERIDINE HCL 25 MG/ML IJ SOLN: 6.2500 mg | INTRAMUSCULAR | Status: DC | PRN

## 2012-12-19 MED ORDER — METOCLOPRAMIDE HCL 5 MG/ML IJ SOLN: 10.0000 mg | Freq: Once | INTRAMUSCULAR | Status: AC | PRN

## 2012-12-19 MED ORDER — KETOROLAC TROMETHAMINE 30 MG/ML IJ SOLN: 30.0000 mg | Freq: Four times a day (QID) | INTRAMUSCULAR | Status: DC | PRN

## 2012-12-19 MED ORDER — OXYTOCIN 10 UNIT/ML IJ SOLN
INTRAMUSCULAR | Status: AC
  Filled 2012-12-19: qty 4

## 2012-12-19 MED ORDER — CITRIC ACID-SODIUM CITRATE 334-500 MG/5ML PO SOLN
30.0000 mL | ORAL | Status: DC | PRN
  Administered 2012-12-19: 30 mL via ORAL
  Filled 2012-12-19: qty 15

## 2012-12-19 MED ORDER — FENTANYL CITRATE 0.05 MG/ML IJ SOLN
INTRAMUSCULAR | Status: AC
  Filled 2012-12-19: qty 2

## 2012-12-19 MED ORDER — FAMOTIDINE IN NACL 20-0.9 MG/50ML-% IV SOLN
20.0000 mg | Freq: Once | INTRAVENOUS | Status: AC
  Administered 2012-12-19: 20 mg via INTRAVENOUS
  Filled 2012-12-19: qty 50

## 2012-12-19 MED ORDER — OXYTOCIN BOLUS FROM INFUSION: 500.0000 mL | INTRAVENOUS | Status: DC

## 2012-12-19 MED ORDER — FENTANYL CITRATE 0.05 MG/ML IJ SOLN: 25.0000 ug | INTRAMUSCULAR | Status: DC | PRN

## 2012-12-19 MED ORDER — ACETAMINOPHEN 325 MG PO TABS: 650.0000 mg | ORAL_TABLET | ORAL | Status: DC | PRN

## 2012-12-19 MED ORDER — LACTATED RINGERS IV SOLN
INTRAVENOUS | Status: DC
  Administered 2012-12-19 (×2): via INTRAVENOUS

## 2012-12-19 MED ORDER — FLEET ENEMA 7-19 GM/118ML RE ENEM: 1.0000 | ENEMA | RECTAL | Status: DC | PRN

## 2012-12-19 MED ORDER — LACTATED RINGERS IV SOLN: 500.0000 mL | INTRAVENOUS | Status: DC | PRN

## 2012-12-19 MED ORDER — ONDANSETRON HCL 4 MG/2ML IJ SOLN: 4.0000 mg | Freq: Four times a day (QID) | INTRAMUSCULAR | Status: DC | PRN

## 2012-12-19 MED ORDER — MORPHINE SULFATE (PF) 0.5 MG/ML IJ SOLN
INTRAMUSCULAR | Status: DC | PRN
  Administered 2012-12-19: .1 mg via INTRATHECAL

## 2012-12-19 MED ORDER — OXYTOCIN 10 UNIT/ML IJ SOLN
40.0000 [IU] | INTRAVENOUS | Status: DC | PRN
  Administered 2012-12-19: 40 [IU] via INTRAVENOUS

## 2012-12-19 MED ORDER — ONDANSETRON HCL 4 MG/2ML IJ SOLN
INTRAMUSCULAR | Status: DC | PRN
  Administered 2012-12-19: 4 mg via INTRAVENOUS

## 2012-12-19 MED ORDER — EPHEDRINE SULFATE 50 MG/ML IJ SOLN
INTRAMUSCULAR | Status: DC | PRN
  Administered 2012-12-19 (×3): 10 mg via INTRAVENOUS

## 2012-12-19 MED ORDER — TERBUTALINE SULFATE 1 MG/ML IJ SOLN: 0.2500 mg | Freq: Once | INTRAMUSCULAR | Status: AC | PRN

## 2012-12-19 MED ORDER — LIDOCAINE HCL (PF) 1 % IJ SOLN
30.0000 mL | INTRAMUSCULAR | Status: DC | PRN
  Filled 2012-12-19: qty 30

## 2012-12-19 MED ORDER — MORPHINE SULFATE 0.5 MG/ML IJ SOLN
INTRAMUSCULAR | Status: AC
  Filled 2012-12-19: qty 10

## 2012-12-19 MED ORDER — METOCLOPRAMIDE HCL 5 MG/ML IJ SOLN: 10.0000 mg | Freq: Once | INTRAMUSCULAR | Status: DC

## 2012-12-19 MED ORDER — CITRIC ACID-SODIUM CITRATE 334-500 MG/5ML PO SOLN
30.0000 mL | Freq: Once | ORAL | Status: AC
  Administered 2012-12-19: 30 mL via ORAL

## 2012-12-19 MED ORDER — OXYCODONE-ACETAMINOPHEN 5-325 MG PO TABS: 1.0000 | ORAL_TABLET | ORAL | Status: DC | PRN

## 2012-12-19 MED ORDER — CEFAZOLIN SODIUM-DEXTROSE 2-3 GM-% IV SOLR
2.0000 g | INTRAVENOUS | Status: AC
  Administered 2012-12-19: 2 g via INTRAVENOUS
  Filled 2012-12-19: qty 50

## 2012-12-19 MED ORDER — SCOPOLAMINE 1 MG/3DAYS TD PT72
MEDICATED_PATCH | TRANSDERMAL | Status: AC
  Administered 2012-12-19: 1.5 mg via TRANSDERMAL
  Filled 2012-12-19: qty 1

## 2012-12-19 MED ORDER — OXYTOCIN 40 UNITS IN LACTATED RINGERS INFUSION - SIMPLE MED
1.0000 m[IU]/min | INTRAVENOUS | Status: DC
  Administered 2012-12-19: 2 m[IU]/min via INTRAVENOUS
  Filled 2012-12-19: qty 1000

## 2012-12-19 MED ORDER — FENTANYL CITRATE 0.05 MG/ML IJ SOLN
INTRAMUSCULAR | Status: DC | PRN
  Administered 2012-12-19: 15 ug via INTRATHECAL

## 2012-12-19 MED ORDER — ONDANSETRON HCL 4 MG/2ML IJ SOLN
INTRAMUSCULAR | Status: AC
  Filled 2012-12-19: qty 2

## 2012-12-19 MED ORDER — IBUPROFEN 600 MG PO TABS: 600.0000 mg | ORAL_TABLET | Freq: Four times a day (QID) | ORAL | Status: DC | PRN

## 2012-12-19 MED ORDER — KETOROLAC TROMETHAMINE 60 MG/2ML IM SOLN: 60.0000 mg | Freq: Once | INTRAMUSCULAR | Status: AC | PRN

## 2012-12-19 SURGICAL SUPPLY — 28 items
CLAMP CORD UMBIL (MISCELLANEOUS) ×3 IMPLANT
CONTAINER PREFILL 10% NBF 15ML (MISCELLANEOUS) ×6 IMPLANT
DRAPE LG THREE QUARTER DISP (DRAPES) ×6 IMPLANT
DRAPE WARM FLUID 44X44 (DRAPE) IMPLANT
DRSG OPSITE POSTOP 4X10 (GAUZE/BANDAGES/DRESSINGS) ×3 IMPLANT
DURAPREP 26ML APPLICATOR (WOUND CARE) ×3 IMPLANT
ELECT REM PT RETURN 9FT ADLT (ELECTROSURGICAL) ×3
ELECTRODE REM PT RTRN 9FT ADLT (ELECTROSURGICAL) ×2 IMPLANT
EXTRACTOR VACUUM M CUP 4 TUBE (SUCTIONS) IMPLANT
GLOVE BIOGEL PI IND STRL 6.5 (GLOVE) ×2 IMPLANT
GLOVE BIOGEL PI INDICATOR 6.5 (GLOVE) ×1
GLOVE SURG SS PI 6.0 STRL IVOR (GLOVE) ×3 IMPLANT
GOWN PREVENTION PLUS XLARGE (GOWN DISPOSABLE) ×6 IMPLANT
GOWN STRL REIN XL XLG (GOWN DISPOSABLE) ×6 IMPLANT
KIT ABG SYR 3ML LUER SLIP (SYRINGE) IMPLANT
NEEDLE HYPO 25X5/8 SAFETYGLIDE (NEEDLE) IMPLANT
NS IRRIG 1000ML POUR BTL (IV SOLUTION) ×3 IMPLANT
PACK C SECTION WH (CUSTOM PROCEDURE TRAY) ×3 IMPLANT
PAD OB MATERNITY 4.3X12.25 (PERSONAL CARE ITEMS) ×3 IMPLANT
RTRCTR C-SECT PINK 25CM LRG (MISCELLANEOUS) ×3 IMPLANT
SEPRAFILM MEMBRANE 5X6 (MISCELLANEOUS) IMPLANT
STAPLER VISISTAT 35W (STAPLE) IMPLANT
SUT PLAIN 0 NONE (SUTURE) ×3 IMPLANT
SUT VIC AB 0 CT1 36 (SUTURE) ×12 IMPLANT
SUT VIC AB 4-0 KS 27 (SUTURE) ×3 IMPLANT
TOWEL OR 17X24 6PK STRL BLUE (TOWEL DISPOSABLE) ×3 IMPLANT
TRAY FOLEY CATH 14FR (SET/KITS/TRAYS/PACK) ×3 IMPLANT
WATER STERILE IRR 1000ML POUR (IV SOLUTION) ×3 IMPLANT

## 2012-12-19 NOTE — Progress Notes (Signed)
Maternal heart rate noted.

## 2012-12-19 NOTE — Patient Instructions (Signed)
Return to clinic for any obstetric concerns or go to [facility] for evaluation  

## 2012-12-19 NOTE — Op Note (Signed)
[name] PROCEDURE DATE: 12/19/2012  PREOPERATIVE DIAGNOSIS: Intrauterine pregnancy at  [redacted]w[redacted]d weeks gestation; elective repeat cesarean section and desire for permanent sterilization  POSTOPERATIVE DIAGNOSIS: The same  PROCEDURE:     Cesarean Section and bilateral tubal ligation using Pomeroy method  SURGEON:  Dr. Gigi Gin Viveka Wilmeth  ASSISTANT:  none  INDICATIONS: [name] is a 27 y.o. G3P2002 at [redacted]w[redacted]d scheduled for cesarean section secondary to elective repeat cesarean section.  The risks of cesarean section discussed with the patient included but were not limited to: bleeding which may require transfusion or reoperation; infection which may require antibiotics; injury to bowel, bladder, ureters or other surrounding organs; injury to the fetus; need for additional procedures including hysterectomy in the event of a life-threatening hemorrhage; placental abnormalities wth subsequent pregnancies, incisional problems, thromboembolic phenomenon and other postoperative/anesthesia complications. The patient concurred with the proposed plan, giving informed written consent for the procedure.  Patient also desires permanent sterilization. Risks and benefits of procedure discussed with patient including permanence of method, bleeding, infection, injury to surrounding organs and need for additional procedures. Risk failure of 0.5-1% with increased risk of ectopic gestation if pregnancy occurs was also discussed with patient.    FINDINGS:  Viable female infant in cephalic presentation.  Apgars 9 and 9.  Clear amniotic fluid.  Intact placenta, three vessel cord.  Normal uterus, fallopian tubes and ovaries bilaterally.  ANESTHESIA:    Spinal INTRAVENOUS FLUIDS:1700 ml ESTIMATED BLOOD LOSS: 600 ml URINE OUTPUT:  50 ml SPECIMENS: Placenta sent to L&D COMPLICATIONS: None immediate  PROCEDURE IN DETAIL:  The patient received intravenous antibiotics and had sequential compression devices applied to  her lower extremities while in the preoperative area.  She was then taken to the operating room where anesthesia was induced and was found to be adequate. A foley catheter was placed into her bladder and attached to Mickie Badders gravity. She was then placed in a dorsal supine position with a leftward tilt, and prepped and draped in a sterile manner. After an adequate timeout was performed, a Pfannenstiel skin incision was made with scalpel and carried through to the underlying layer of fascia. The fascia was incised in the midline and this incision was extended bilaterally using the Mayo scissors. Kocher clamps were applied to the superior aspect of the fascial incision and the underlying rectus muscles were dissected off bluntly. A similar process was carried out on the inferior aspect of the facial incision. The rectus muscles were separated in the midline bluntly and the peritoneum was entered bluntly. The Alexis self-retaining retractor was introduced into the abdominal cavity. Attention was turned to the lower uterine segment where a bladder flap was created, and a transverse hysterotomy was made with a scalpel and extended bilaterally bluntly. The infant was successfully delivered, and cord was clamped and cut and infant was handed over to awaiting neonatology team. Uterine massage was then administered and the placenta delivered intact with three-vessel cord. The uterus was cleared of clot and debris.  The hysterotomy was closed with 0 Vicryl in a running locked fashion, and an imbricating layer was also placed with a 0 Vicryl. Overall, excellent hemostasis was noted. The patient's left fallopian tube was then identified, brought to the incision, and grasped with a Babcock clamp. The tube was then followed out to the fimbria. The Babcock clamp was then used to grasp the tube approximately 4 cm from the cornual region. A 3 cm segment of the tube was then ligated with free tie of plain gut  suture, transected and  excised. Good hemostasis was noted and the tube was returned to the abdomen. The right fallopian tube was then identified to its fimbriated end, ligated, and a 3 cm segment excised in a similar fashion. Excellent hemostasis was noted, and the tube returned to the abdomen. The pelvis copiously irrigated and cleared of all clot and debris. Hemostasis was confirmed on all surfaces.  The peritoneum and the muscles were reapproximated using 0 vicryl interrupted stitches. The fascia was then closed using 0 Vicryl in a running locked fashion.  The subcutaneous layer was reapproximated with plain gut and the skin was closed in a subcuticular fashion using 3.0 Vicryl. The patient tolerated the procedure well. Sponge, lap, instrument and needle counts were correct x 2. She was taken to the recovery room in stable condition.    [name],PEGGYMD  12/19/2012 11:09 PM

## 2012-12-19 NOTE — H&P (Signed)
[name] is a 27 y.o. female G3P2002 with IUP at [redacted]w[redacted]d by LMP c/w 1st trim [place] presenting for SROM and desire for TOLAC. Pt states she has been having no contractions, associated with no vaginal bleeding.  Membranes are ruptured, clear fluid, with active fetal movement.     [facility] at [facility]  since 6 wks. Uncomplicated. Rh Neg and received rhogam at 28 weeks.  GBS neg. Normal anatomy. Low lying placenta.    Prenatal History/Complications:  Past Medical History: Past Medical History  Diagnosis Date  . Rh negative state in antepartum period   . Abnormal Pap smear   . No pertinent past medical history     Past Surgical History: Past Surgical History  Procedure Laterality Date  . Tonsillectomy      27 yrs old  . Cesarean section  10/07/2011    Procedure: CESAREAN SECTION;  Surgeon: Willodean Rosenthal, MD;  Location: [facility];  Service: Obstetrics;  Laterality: N/A;  . Dilation and curettage of uterus      Obstetrical History: OB History   Grav Para Term Preterm Abortions TAB SAB Ect Mult Living   3 2 2  0 0 0 0 0 0 2    G1- 6lb 5oz NSVD G2- 7lb PLTCS for transverse lie G3 - current.    Social History: History   Social History  . Marital Status: Married    Spouse Name: N/A    Number of Children: N/A  . Years of Education: N/A   Social History Main Topics  . Smoking status: Never Smoker   . Smokeless tobacco: Never Used  . Alcohol Use: No  . Drug Use: No  . Sexual Activity: Yes    Partners: Male    Birth Control/ Protection: None   Other Topics Concern  . Not on file   Social History Narrative  . No narrative on file    Family History: Family History  Problem Relation Age of Onset  . Diabetes Maternal Grandmother   . Diabetes Maternal Grandfather   . Anesthesia problems Neg Hx   . Hypotension Neg Hx   . Malignant hyperthermia Neg Hx   . Pseudochol deficiency Neg Hx     Allergies: No Known Allergies  Prescriptions prior to admission   Medication Sig Dispense Refill  . diphenhydramine-acetaminophen (TYLENOL PM) 25-500 MG TABS Take 1 tablet by mouth at bedtime as needed.      . Misc. Devices (BREAST PUMP) MISC Dispense one breast pump for patient  1 each  0  . Prenat-FeFum-FePo-FA-Omega 3 (CONCEPT DHA) 53.5-38-1 MG CAPS Take 1 capsule by mouth daily.  30 capsule  10     Review of Systems   Constitutional: Negative for fever, chills, weight loss, malaise/fatigue and diaphoresis.  HENT: Negative for hearing loss, ear pain, nosebleeds, congestion, sore throat, neck pain, tinnitus and ear discharge.   Eyes: Negative for blurred vision, double vision, photophobia, pain, discharge and redness.  Respiratory: Negative for cough, hemoptysis, sputum production, shortness of breath, wheezing and stridor.   Cardiovascular: Negative for chest pain, palpitations, orthopnea,  leg swelling  Gastrointestinal: neg for abd pain. Negative for heartburn, nausea, vomiting, diarrhea, constipation, blood in stool Genitourinary: Negative for dysuria, urgency, frequency, hematuria and flank pain.  Musculoskeletal: Negative for myalgias, back pain, joint pain and falls.  Skin: Negative for itching and rash.  Neurological: Negative for dizziness, tingling, tremors, sensory change, speech change, focal weakness, seizures, loss of consciousness, weakness and headaches.  Endo/Heme/Allergies: Negative for environmental allergies and  polydipsia. Does not bruise/bleed easily.  Psychiatric/Behavioral: Negative for depression, suicidal ideas, hallucinations, memory loss and substance abuse. The patient is not nervous/anxious and does not have insomnia.       Blood pressure 131/79, pulse 76, temperature 98.2 F (36.8 C), temperature source Oral, resp. rate 18, height 5\' 7"  (1.702 m), weight 79.833 kg (176 lb), last menstrual period 03/21/2012. General appearance: alert, cooperative and no distress Lungs: normal effort Heart: normal rate Abdomen: soft,  non-tender; gravid Pelvic: 3/70/-3 per Dr. Macon Large (VTX) Extremities: Homans sign is negative, no sign of DVT Presentation: cephalic Fetal monitoringBaseline: 145 bpm, Variability: Good {> 6 bpm), Accelerations: Reactive and Decelerations: Absent Uterine activity irritable    Dilation: 3 Effacement (%): 70 Station: -3 Exam by:: DR [id number]   Prenatal labs: ABO, Rh: O/NEG/-- (02/03 1207) Antibody: NEG (07/08 0946) Rubella:   RPR: NON REAC (07/08 0946)  HBsAg: NEGATIVE (02/03 1207)  HIV: NON REACTIVE (07/08 0946)  GBS: Negative (09/08 0000)  1 hr Glucola 76 Genetic screening  Normal 1st trim Anatomy US normal  .resultrcnt[24h  Assessment: [name] is a 27 y.o. [id number] with an IUP at [redacted]w[redacted]d presenting for SROM and desiring TOLAC.  Plan: 1) SROM - admit to L&D - routine order - epidural prn - augment with pitocin 2/2 favorable bishops  2) previous c/s - desires tolac - consent signed   3) fwb - cat I tracing - gbs neg - EFW 6.5#  4) desires pp tubal  5) anticipate SVD    Evangaline Jou L, MD 12/19/2012, 11:09 AM

## 2012-12-19 NOTE — Anesthesia Procedure Notes (Signed)
Spinal  Patient location during procedure: OR Start time: 12/19/2012 10:15 PM Staffing Performed by: anesthesiologist  Preanesthetic Checklist Completed: patient identified, site marked, surgical consent, pre-op evaluation, timeout performed, IV checked, risks and benefits discussed and monitors and equipment checked Spinal Block Patient position: sitting Prep: site prepped and draped and DuraPrep Patient monitoring: blood pressure, continuous pulse ox and heart rate Approach: midline Location: L3-4 Injection technique: single-shot Needle Needle type: Pencan  Needle gauge: 24 G Needle length: 9 cm Assessment Sensory level: T4 Additional Notes Clear free flow CSF on first attempt.  No paresthesia.  Patient tolerated procedure well with no apparent complications.  Jasmine December, MD

## 2012-12-19 NOTE — Transfer of Care (Signed)
Immediate Anesthesia Transfer of Care Note  Patient: [name]  Procedure(s) Performed: Procedure(s): CESAREAN SECTION WITH BILATERAL TUBAL LIGATION (N/A)  Patient Location: PACU  Anesthesia Type:Spinal  Level of Consciousness: awake, alert  and oriented  Airway & Oxygen Therapy: Patient Spontanous Breathing  Post-op Assessment: Report given to PACU RN and Post -op Vital signs reviewed and stable  Post vital signs: Reviewed and stable  Complications: No apparent anesthesia complications

## 2012-12-19 NOTE — Progress Notes (Signed)
P-84  Patient feels like her water broke this morning.  She is scheduled for a c-section later today.  Repeat BP is 121/82.

## 2012-12-19 NOTE — Anesthesia Preprocedure Evaluation (Signed)
Anesthesia Evaluation  Patient identified by MRN, date of birth, ID band Patient awake    Reviewed: Allergy & Precautions, H&P , NPO status , Patient's Chart, lab work & pertinent test results, reviewed documented beta blocker date and time   History of Anesthesia Complications Negative for: history of anesthetic complications  Airway Mallampati: I TM Distance: >3 FB Neck ROM: full    Dental  (+) Teeth Intact   Pulmonary neg pulmonary ROS,  breath sounds clear to auscultation        Cardiovascular negative cardio ROS  Rhythm:regular Rate:Normal     Neuro/Psych  Headaches (migraines - none in this pregnancy), negative psych ROS   GI/Hepatic negative GI ROS, Neg liver ROS,   Endo/Other  negative endocrine ROS  Renal/GU negative Renal ROS  negative genitourinary   Musculoskeletal   Abdominal   Peds  Hematology negative hematology ROS (+)   Anesthesia Other Findings Last ate pretzels and jello at 4 pm Tongue piercing - asked to remove  Reproductive/Obstetrics (+) Pregnancy (h/o c/s x1, SROM for repeat and BTL)                           Anesthesia Physical Anesthesia Plan  ASA: II  Anesthesia Plan: Spinal   Post-op Pain Management:    Induction:   Airway Management Planned:   Additional Equipment:   Intra-op Plan:   Post-operative Plan:   Informed Consent: I have reviewed the patients History and Physical, chart, labs and discussed the procedure including the risks, benefits and alternatives for the proposed anesthesia with the patient or authorized representative who has indicated his/her understanding and acceptance.     Plan Discussed with: Surgeon and CRNA  Anesthesia Plan Comments:         Anesthesia Quick Evaluation

## 2012-12-19 NOTE — Progress Notes (Signed)
27 yo G3P2002 at 39 weeks admitted with SROM and initial desire for TOLAC. Called to the room as patient opted to have an elective repeat cesarean section. Risks, benefits and alternatives were explained including but not limited to risks of bleeding, infection and damage to adjacent organs. Patient verbalized understanding and all questions were answered. Patient also desires permanent sterilization which will be done at the time of the cesarean section. Patient understands the irreversible nature of bilateral tubal ligation.

## 2012-12-19 NOTE — Progress Notes (Signed)
On exam, patient is grossly ruptured. Positive pool and nitrazine. Cervix 3/60/-3.  Patient changed her ming again, desires TOLAC for now but reports she can still change her mind later. Scheuled for RCS at 1400 today. Will send to [facility]; Attending on call notified.

## 2012-12-20 ENCOUNTER — Encounter (HOSPITAL_COMMUNITY): Payer: Self-pay | Admitting: General Practice

## 2012-12-20 LAB — CBC
Hemoglobin: 9.6 g/dL — ABNORMAL LOW (ref 12.0–15.0)
MCH: 29.7 pg (ref 26.0–34.0)
MCHC: 34 g/dL (ref 30.0–36.0)
MCV: 87.3 fL (ref 78.0–100.0)
Platelets: 143 10*3/uL — ABNORMAL LOW (ref 150–400)
RDW: 14.8 % (ref 11.5–15.5)

## 2012-12-20 MED ORDER — ONDANSETRON HCL 4 MG/2ML IJ SOLN: 4.0000 mg | INTRAMUSCULAR | Status: DC | PRN

## 2012-12-20 MED ORDER — OXYTOCIN 40 UNITS IN LACTATED RINGERS INFUSION - SIMPLE MED: 62.5000 mL/h | INTRAVENOUS | Status: AC

## 2012-12-20 MED ORDER — NALOXONE HCL 0.4 MG/ML IJ SOLN: 0.4000 mg | INTRAMUSCULAR | Status: DC | PRN

## 2012-12-20 MED ORDER — DIPHENHYDRAMINE HCL 50 MG/ML IJ SOLN: 25.0000 mg | INTRAMUSCULAR | Status: DC | PRN

## 2012-12-20 MED ORDER — NALBUPHINE HCL 10 MG/ML IJ SOLN
5.0000 mg | INTRAMUSCULAR | Status: DC | PRN
  Administered 2012-12-20: 5 mg via INTRAVENOUS
  Filled 2012-12-20: qty 1

## 2012-12-20 MED ORDER — SODIUM CHLORIDE 0.9 % IJ SOLN: 3.0000 mL | INTRAMUSCULAR | Status: DC | PRN

## 2012-12-20 MED ORDER — WITCH HAZEL-GLYCERIN EX PADS: 1.0000 "application " | MEDICATED_PAD | CUTANEOUS | Status: DC | PRN

## 2012-12-20 MED ORDER — ONDANSETRON HCL 4 MG/2ML IJ SOLN: 4.0000 mg | Freq: Three times a day (TID) | INTRAMUSCULAR | Status: DC | PRN

## 2012-12-20 MED ORDER — LACTATED RINGERS IV SOLN
INTRAVENOUS | Status: DC
  Administered 2012-12-20: 05:00:00 via INTRAVENOUS

## 2012-12-20 MED ORDER — SIMETHICONE 80 MG PO CHEW
80.0000 mg | CHEWABLE_TABLET | Freq: Three times a day (TID) | ORAL | Status: DC
  Administered 2012-12-20 – 2012-12-21 (×4): 80 mg via ORAL

## 2012-12-20 MED ORDER — SENNOSIDES-DOCUSATE SODIUM 8.6-50 MG PO TABS
2.0000 | ORAL_TABLET | ORAL | Status: DC
  Administered 2012-12-21: 2 via ORAL

## 2012-12-20 MED ORDER — OXYCODONE-ACETAMINOPHEN 5-325 MG PO TABS
1.0000 | ORAL_TABLET | ORAL | Status: DC | PRN
  Administered 2012-12-21: 1 via ORAL
  Filled 2012-12-20: qty 1

## 2012-12-20 MED ORDER — PRENATAL MULTIVITAMIN CH
1.0000 | ORAL_TABLET | Freq: Every day | ORAL | Status: DC
  Administered 2012-12-20 – 2012-12-21 (×2): 1 via ORAL
  Filled 2012-12-20 (×2): qty 1

## 2012-12-20 MED ORDER — NALBUPHINE HCL 10 MG/ML IJ SOLN: 5.0000 mg | INTRAMUSCULAR | Status: DC | PRN

## 2012-12-20 MED ORDER — ONDANSETRON HCL 4 MG PO TABS: 4.0000 mg | ORAL_TABLET | ORAL | Status: DC | PRN

## 2012-12-20 MED ORDER — LANOLIN HYDROUS EX OINT: 1.0000 "application " | TOPICAL_OINTMENT | CUTANEOUS | Status: DC | PRN

## 2012-12-20 MED ORDER — RHO D IMMUNE GLOBULIN 1500 UNIT/2ML IJ SOLN
300.0000 ug | Freq: Once | INTRAMUSCULAR | Status: AC
  Administered 2012-12-20: 300 ug via INTRAVENOUS
  Filled 2012-12-20: qty 2

## 2012-12-20 MED ORDER — SIMETHICONE 80 MG PO CHEW: 80.0000 mg | CHEWABLE_TABLET | ORAL | Status: DC | PRN

## 2012-12-20 MED ORDER — SIMETHICONE 80 MG PO CHEW
80.0000 mg | CHEWABLE_TABLET | ORAL | Status: DC
  Administered 2012-12-21: 80 mg via ORAL

## 2012-12-20 MED ORDER — DIPHENHYDRAMINE HCL 25 MG PO CAPS: 25.0000 mg | ORAL_CAPSULE | Freq: Four times a day (QID) | ORAL | Status: DC | PRN

## 2012-12-20 MED ORDER — NALOXONE HCL 1 MG/ML IJ SOLN: 1.0000 ug/kg/h | INTRAVENOUS | Status: DC | PRN

## 2012-12-20 MED ORDER — DIBUCAINE 1 % RE OINT: 1.0000 "application " | TOPICAL_OINTMENT | RECTAL | Status: DC | PRN

## 2012-12-20 MED ORDER — MENTHOL 3 MG MT LOZG: 1.0000 | LOZENGE | OROMUCOSAL | Status: DC | PRN

## 2012-12-20 MED ORDER — IBUPROFEN 600 MG PO TABS
600.0000 mg | ORAL_TABLET | Freq: Four times a day (QID) | ORAL | Status: DC
  Administered 2012-12-20 – 2012-12-21 (×6): 600 mg via ORAL
  Filled 2012-12-20 (×6): qty 1

## 2012-12-20 MED ORDER — DIPHENHYDRAMINE HCL 50 MG/ML IJ SOLN: 12.5000 mg | INTRAMUSCULAR | Status: DC | PRN

## 2012-12-20 MED ORDER — METOCLOPRAMIDE HCL 5 MG/ML IJ SOLN: 10.0000 mg | Freq: Three times a day (TID) | INTRAMUSCULAR | Status: DC | PRN

## 2012-12-20 MED ORDER — DIPHENHYDRAMINE HCL 25 MG PO CAPS
25.0000 mg | ORAL_CAPSULE | ORAL | Status: DC | PRN
  Administered 2012-12-20: 25 mg via ORAL
  Filled 2012-12-20: qty 1

## 2012-12-20 NOTE — Anesthesia Postprocedure Evaluation (Signed)
  Anesthesia Post-op Note  Patient: [name]  Procedure(s) Performed: * No procedures listed *  Patient Location: Mother/Baby  Anesthesia Type:Spinal  Level of Consciousness: awake, alert  and oriented  Airway and Oxygen Therapy: Patient Spontanous Breathing  Post-op Pain: none  Post-op Assessment: Post-op Vital signs reviewed, Patient's Cardiovascular Status Stable, No headache, No backache, No residual numbness and No residual motor weakness  Post-op Vital Signs: Reviewed and stable  Complications: No apparent anesthesia complications

## 2012-12-20 NOTE — Anesthesia Postprocedure Evaluation (Signed)
  Anesthesia Post-op Note  Anesthesia Post Note  Patient: [name]  Procedure(s) Performed: Procedure(s) (LRB): CESAREAN SECTION WITH BILATERAL TUBAL LIGATION (N/A)  Anesthesia type: Spinal  Patient location: PACU  Post pain: Pain level controlled  Post assessment: Post-op Vital signs reviewed  Last Vitals:  Filed Vitals:   12/20/12 0102  BP: 124/78  Pulse: 84  Temp: 36.4 C  Resp: 16    Post vital signs: Reviewed  Level of consciousness: awake  Complications: No apparent anesthesia complications

## 2012-12-20 NOTE — Lactation Note (Signed)
This note was copied from the chart of Linda Kasidi Shanker. Lactation Consultation Note Mom states breast feeding is going very well. States that her first 2 children did not latch well, and she breast fed them "not very long". Mom states she wants to exclusively breast feed this baby.  At this time, baby is sound asleep, mom holding. Mom states baby has a good latch, denies nipple pain, states she knows how to make adjustments for a deeper latch. Reviewed baby and me book, br feeding basics. Reviewed lactation brochure and community resources and BFSG. Enc mom to call for help if she has any concerns.  Patient Name: [name] [id number] Date: 12/20/2012 Reason for consult: Initial assessment   Maternal Data Formula Feeding for Exclusion: No Has patient been taught Hand Expression?: Yes Does the patient have breastfeeding experience prior to this delivery?: Yes  Feeding Feeding Type: Breast Milk Length of feed: 36 min  LATCH Score/Interventions                      Lactation Tools Discussed/Used     Consult Status Consult Status: PRN    Lenard Forth 12/20/2012, 10:29 AM

## 2012-12-21 LAB — RH IG WORKUP (INCLUDES ABO/RH)
ABO/RH(D): O NEG
Gestational Age(Wks): 0
Unit division: 0

## 2012-12-21 MED ORDER — IBUPROFEN 600 MG PO TABS: 600.0000 mg | ORAL_TABLET | Freq: Four times a day (QID) | ORAL | Status: DC

## 2012-12-21 MED ORDER — OXYCODONE-ACETAMINOPHEN 5-325 MG PO TABS: 1.0000 | ORAL_TABLET | ORAL | Status: DC | PRN

## 2012-12-21 NOTE — Lactation Note (Signed)
This note was copied from the chart of [name]. Lactation Consultation Note: Follow up visit with mom. She has baby latched to the breast when I went in. Reports that this baby is nursing so much better that other 2 children. Asking about whether to do both breasts at each feeding. Suggested always offering the second breast- especially as the milk increases. No further questions at this time. To call prn  Patient Name: Linda Neyra Pettie [id number] Date: 12/21/2012 Reason for consult: Follow-up assessment   Maternal Data    Feeding   LATCH Score/Interventions                      Lactation Tools Discussed/Used     Consult Status Consult Status: Complete    Pamelia Hoit 12/21/2012, 8:48 AM

## 2012-12-21 NOTE — Discharge Summary (Signed)
Attestation of Attending Supervision of Advanced Practitioner (PA/CNM/NP): Evaluation and management procedures were performed by the Advanced Practitioner under my supervision and collaboration.  I have reviewed the Advanced Practitioner's note and chart, and I agree with the management and plan.  Alani Lacivita, MD, FACOG Attending Obstetrician & Gynecologist Faculty Practice, [facility]  

## 2012-12-21 NOTE — Discharge Summary (Signed)
Obstetric Discharge Summary Reason for Admission: rupture of membranes and latent labor Prenatal Procedures: none Intrapartum Procedures: cesarean: low cervical, transverse and tubal ligation Postpartum Procedures: none Complications-Operative and Postpartum: none Hemoglobin  Date Value Range Status  12/20/2012 9.6* 12.0 - 15.0 g/dL Final     HCT  Date Value Range Status  12/20/2012 28.2* 36.0 - 46.0 % Final   Ms Garza is a 27 yo G3P3003 who was admitted in the morning of 10/2 at 39.0wks with SROM and desire for TOLAC. She labored during the day in [facility] and then that evening had a change of mind and wanted a RLTCS and BTL. She tolerated the procedure well and at this time has been deemed to have received the full benefit of her hospital stay and will be discharged home. Breastfeeding is going well.  Physical Exam:  General: alert, cooperative and mild distress Lochia: appropriate Uterine Fundus: firm Incision: healing well, no significant drainage DVT Evaluation: No evidence of DVT seen on physical exam.  Discharge Diagnoses: Term Pregnancy-delivered  Discharge Information: Date: 12/21/2012  Activity: pelvic rest Diet: routine Medications: PNV, Ibuprofen and Percocet Condition: stable Instructions: refer to practice specific booklet Discharge to: home Follow-up Information   Follow up with [facility]. (Make a postpartum visit for 4-6 weeks.)    Specialty:  Obstetrics and Gynecology   Contact information:   [address] [place] [address] [phone]      Newborn Data: Live born female  Birth Weight: 7 lb 3.5 oz (3274 g) APGAR: 9, 9  Home with mother.  Cam Hai 12/21/2012, 7:32 AM

## 2012-12-25 ENCOUNTER — Encounter: Payer: 59 | Admitting: Family Medicine

## 2013-01-23 ENCOUNTER — Other Ambulatory Visit: Payer: Self-pay

## 2013-02-03 ENCOUNTER — Encounter: Payer: Self-pay | Admitting: Obstetrics & Gynecology

## 2013-02-03 ENCOUNTER — Ambulatory Visit (INDEPENDENT_AMBULATORY_CARE_PROVIDER_SITE_OTHER): Payer: 59 | Admitting: Obstetrics & Gynecology

## 2013-02-03 NOTE — Patient Instructions (Signed)
Return to clinic for any scheduled appointments or for any gynecologic concerns as needed.   

## 2013-02-03 NOTE — Progress Notes (Signed)
  Subjective:     [name] is a 27 y.o. G44P3003 female who presents for a postpartum visit. She is 6 weeks postpartum following a RLTCS and BTS. I have fully reviewed the prenatal and intrapartum course. The delivery was at 39 gestational weeks. Postpartum and baby's courses have been uncomplicared.  Baby is feeding by breast. Bleeding: no bleeding. Bowel function is normal. Bladder function is normal.  Patient is sexually active. Contraception method is tubal ligation. Postpartum depression screening: negative.  The following portions of the patient's history were reviewed and updated as appropriate: allergies, current medications, past family history, past medical history, past social history, past surgical history and problem list.  Last pap was normal in 10/2011.  Review of Systems Pertinent items are noted in HPI.   Objective:    BP 133/86  Pulse 54  Ht 5\' 7"  (1.702 m)  Wt 156 lb (70.761 kg)  BMI 24.43 kg/m2  Breastfeeding? Yes  General:  alert and no distress   Breasts:  inspection negative, no nipple discharge or bleeding, no masses or nodularity palpable  Lungs: clear to auscultation bilaterally  Heart:  regular rate and rhythm  Abdomen: soft, non-tender; bowel sounds normal; no masses,  no organomegaly.  Incision C/D/I, healing well.   Vulva:  normal  Vagina: normal vagina  Cervix:  anteverted and no lesions  Corpus: normal size, contour, position, consistency, mobility, non-tender  Adnexa:  normal adnexa and no mass, fullness, tenderness  Rectal Exam: Normal rectovaginal exam        Assessment:   Normal postpartum exam. Pap smear not done at today's visit.   Plan:   1. Contraception: tubal ligation 2. BP more elevated than usual, no preeclampsia symptoms.  Patient told to return for any concerning symptoms and to check BP at home. 3. Follow up for annual exam or as needed.   Jaynie Collins, MD, FACOG Attending Obstetrician & Gynecologist Faculty Practice,  [facility] [place]

## 2013-10-29 IMAGING — US US OB NUCHAL TRANSLUCENCY 1ST GEST
1 series · 13 of 28 positions shown · non-contrast
Comparison: none

[Series 1: us ob nuchal translucency 1st gest · 13 of 33 slices shown]
[im 2/33]
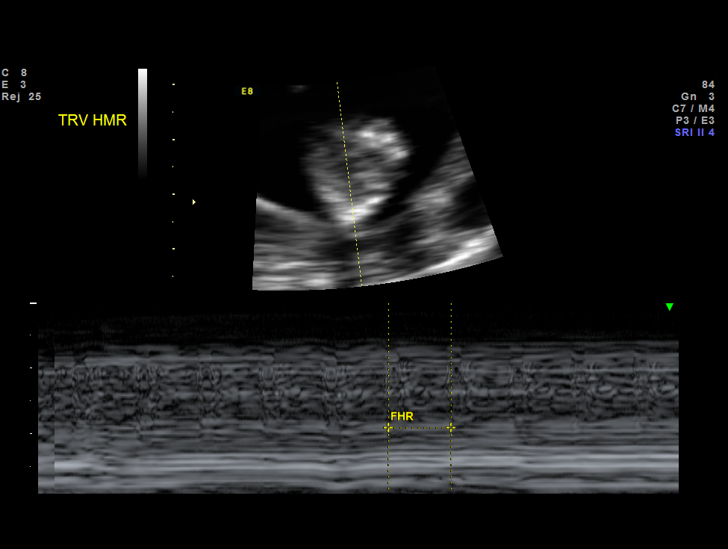
[im 4/33]
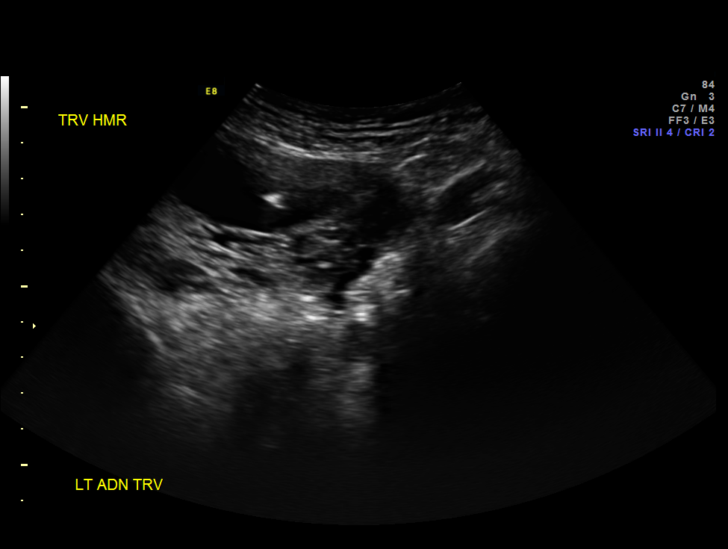
[im 6/33]
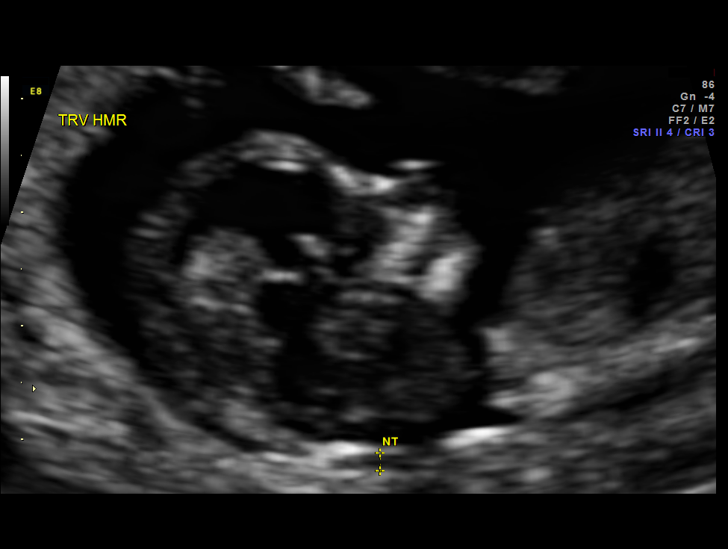
[im 9/33]
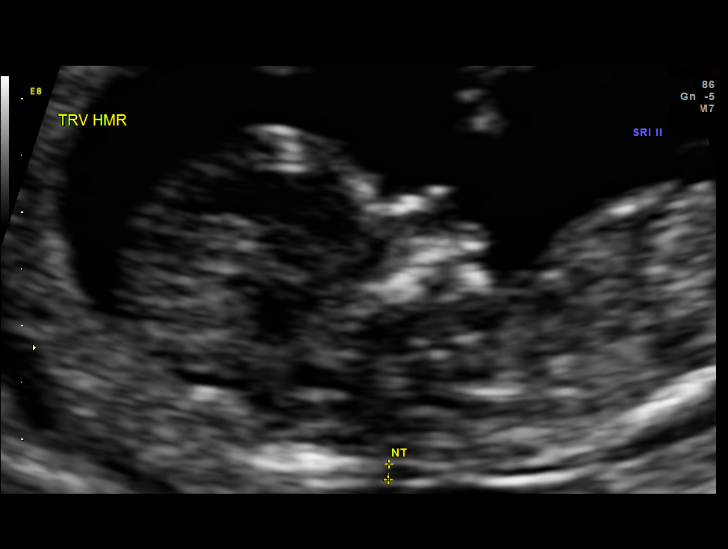
[im 11/33]
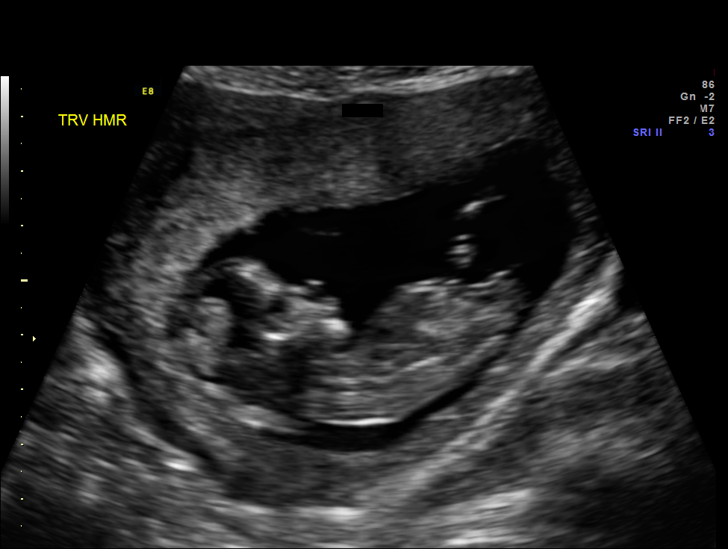
[im 14/33]
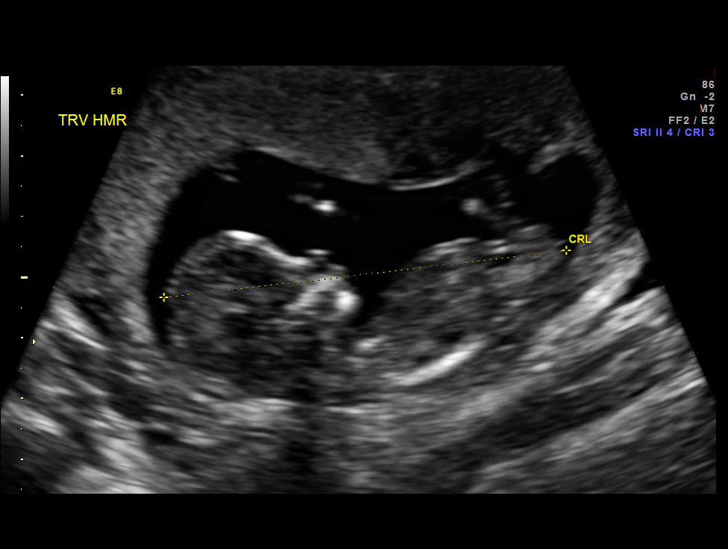
[im 17/33]
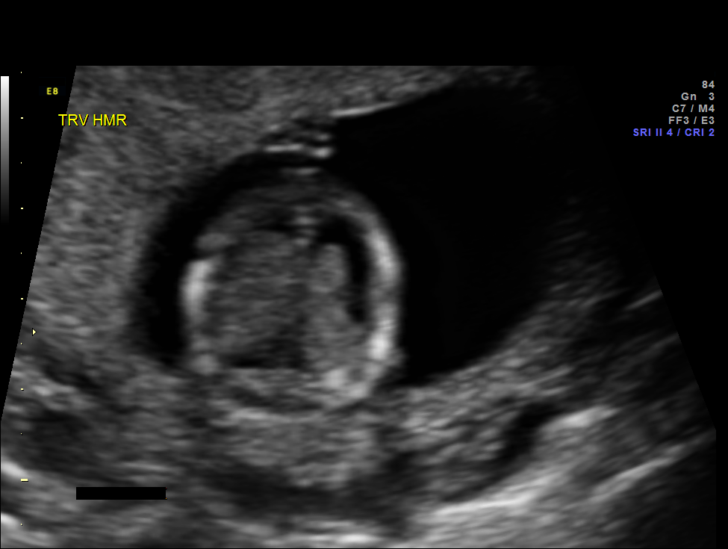
[im 19/33]
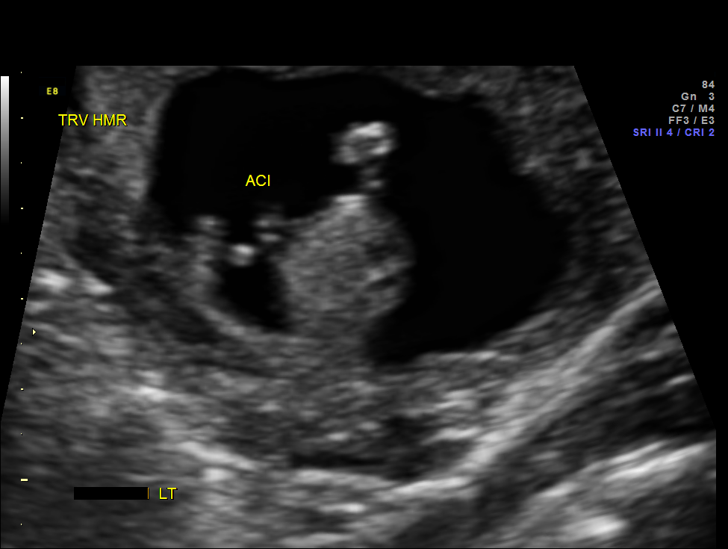
[im 22/33]
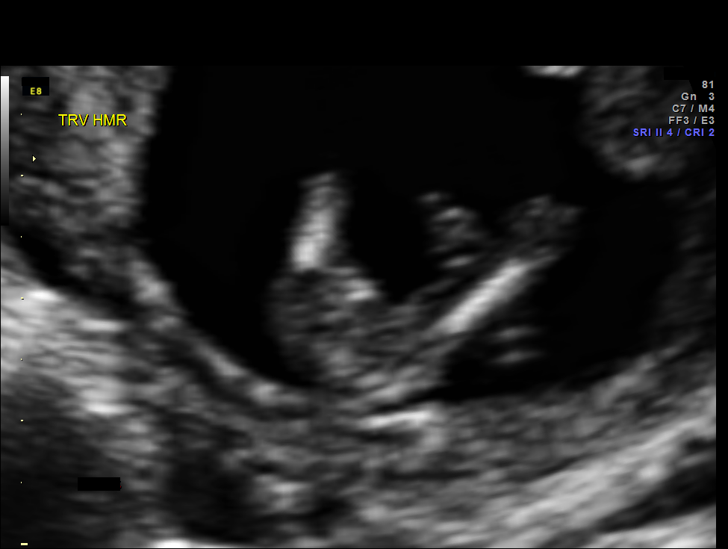
[im 24/33]
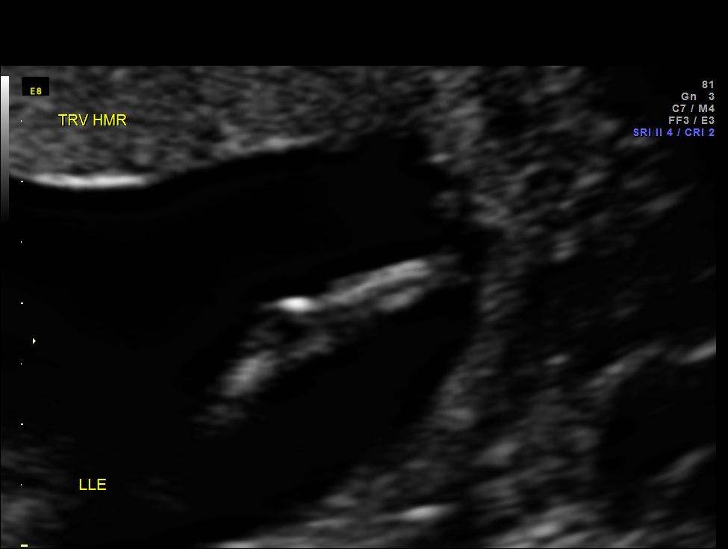
[im 27/33]
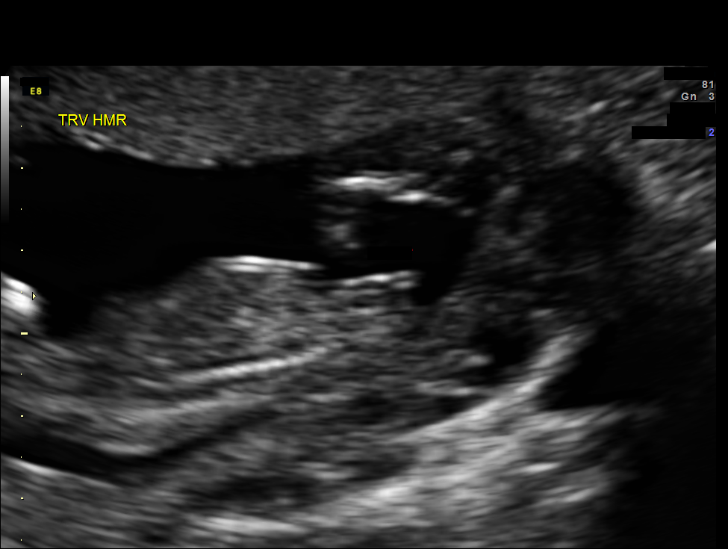
[im 29/33]
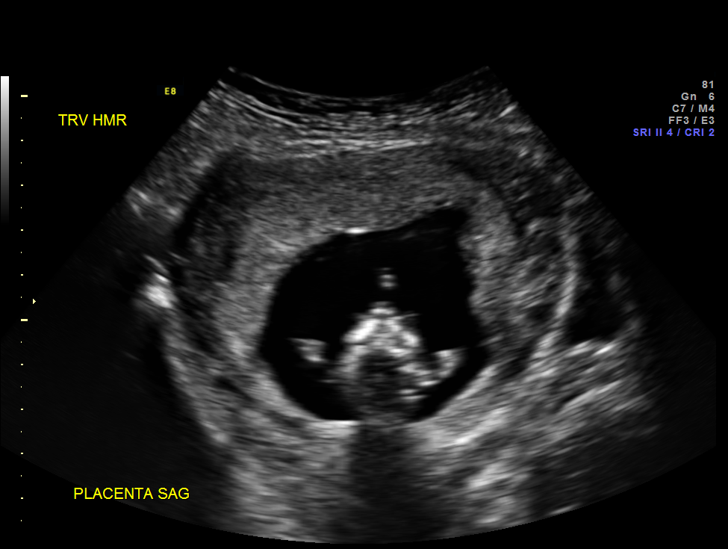
[im 31/33]
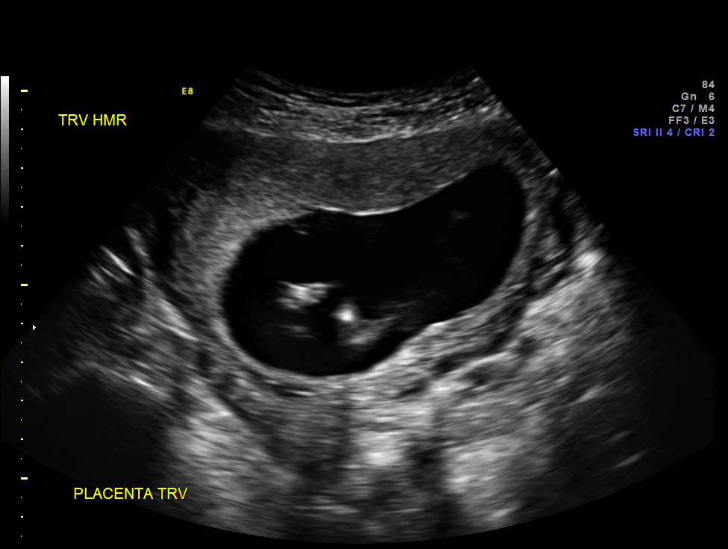

[13 of 28 positions shown; findings below may reference images not displayed]

OBSTETRICS REPORT
                      (Signed Final 06/21/2012 [DATE])

Service(s) Provided

 US FETAL NUCHAL TRANSLUCENCY                          76813.0
 MEASUREMENT
Indications

 First trimester aneuploidy screen (NT)
 History of cesarean delivery, currently pregnant      654.20,
 Rh negative
Fetal Evaluation

 Num Of Fetuses:    1
 Fetal Heart Rate:  161                          bpm
 Cardiac Activity:  Observed
 Presentation:      Transverse, head to
                    maternal right
 Placenta:          Anterior
 P. Cord            Visualized, central
 Insertion:

 Amniotic Fluid
 AFI FV:      Subjectively within normal limits
Biometry

 CRL:     65.4  mm     G. Age:  12w 5d                 EDD:    12/29/12
Gestational Age

 LMP:           13w 1d        Date:  03/21/12                 EDD:   12/26/12
 Best:          13w 1d     Det. By:  LMP  (03/21/12)          EDD:   12/26/12
1st Trimester Genetic Sonogram Screening

 CRL:            65.4  mm    G. Age:   12w 5d                 EDD:   12/29/12
 Nuc Trans:       1.2  mm
 Nasal Bone:                 Present
Anatomy

 Cranium:          Appears normal         Cord Vessels:     Appears normal (3
                                                            vessel cord)
 Choroid Plexus:   Appears normal         Bladder:          Appears normal
 Stomach:          Appears normal, left   Lower             Visualized
                   sided                  Extremities:
 Abdominal Wall:   Appears nml (cord      Upper             Visualized
                   insert, abd wall)      Extremities:

 Other:  Nasal bone visualized.
Cervix Uterus Adnexa

 Cervix:       Normal appearance by transabdominal scan.

 Adnexa:     No abnormality visualized.
Impression

 Single IUP at 13 [DATE] weeks
 NT of 1.2 mm noted.  A nasal bone was visualized.
 First trimester aneuploidy screen performed as noted above.
Recommendations

 Please do not draw triple/quad screen, though patient should
 be offered MSAFP for neural tube defect screening.
 Recommend follow-up ultrasound examination in 6 weeks for
 anatomy.

## 2013-11-18 ENCOUNTER — Encounter: Payer: Self-pay | Admitting: Obstetrics & Gynecology

## 2013-11-18 ENCOUNTER — Ambulatory Visit (INDEPENDENT_AMBULATORY_CARE_PROVIDER_SITE_OTHER): Payer: 59 | Admitting: Obstetrics & Gynecology

## 2013-11-18 VITALS — BP 126/67 | HR 61 | Ht 67.0 in | Wt 157.0 lb

## 2013-11-18 DIAGNOSIS — Z124 Encounter for screening for malignant neoplasm of cervix: Secondary | ICD-10-CM

## 2013-11-18 DIAGNOSIS — Z01419 Encounter for gynecological examination (general) (routine) without abnormal findings: Secondary | ICD-10-CM

## 2013-11-18 DIAGNOSIS — Z1151 Encounter for screening for human papillomavirus (HPV): Secondary | ICD-10-CM

## 2013-11-18 NOTE — Progress Notes (Signed)
    GYNECOLOGY CLINIC ANNUAL PREVENTATIVE CARE ENCOUNTER NOTE  Subjective:     [name] is a 28 y.o. G21P3003 female here for a routine annual gynecologic exam.  Current complaints:  none.     Gynecologic History No LMP recorded. Contraception: tubal ligation Last Pap: 11/07/11. Results were: normal  Obstetric History OB History  Gravida Para Term Preterm AB SAB TAB Ectopic Multiple Living  0 0 0 0 0 0 3    # Outcome Date GA Lbr Len/2nd Weight Sex Delivery Anes PTL Lv  3 TRM 12/19/12 108w0d  7 lb 3.5 oz (3.274 kg) F LTCS Spinal  Y  2 TRM 10/07/11 [redacted]w[redacted]d  6 lb 8.4 oz (2.96 kg) F LTCS Spinal  Y  1 TRM 07/28/10 [redacted]w[redacted]d  6 lb 14 oz (3.118 kg) M SVD EPI N Y     The following portions of the patient's history were reviewed and updated as appropriate: allergies, current medications, past family history, past medical history, past social history, past surgical history and problem list.  Review of Systems A comprehensive review of systems was negative.    Objective:   BP 126/67  Pulse 61  Ht  (1.702 m)  Wt 157 lb (71.215 kg)  BMI 24.58 kg/m2  Breastfeeding? Yes GENERAL: Well-developed, well-nourished female in no acute distress.  HEENT: Normocephalic, atraumatic. Sclerae anicteric.  NECK: Supple. Normal thyroid.  LUNGS: Clear to auscultation bilaterally.  HEART: Regular rate and rhythm. BREASTS: Symmetric in size. No masses, skin changes, nipple drainage, or lymphadenopathy. ABDOMEN: Soft, nontender, nondistended. No organomegaly. PELVIC: Normal external female genitalia. Vagina is pink and rugated.  Normal discharge. Normal cervix contour. Pap smear obtained, some spotting after pap observed. Uterus is normal in size. No adnexal mass or tenderness.  EXTREMITIES: No cyanosis, clubbing, or edema, 2+ distal pulses.   Assessment:   Annual gynecologic examination   Plan:   Pap done, will follow up results and manage accordingly. Routine preventative health  maintenance measures emphasized   Jaynie Collins, MD, FACOG Attending Obstetrician & Gynecologist [facility], [facility]

## 2013-11-18 NOTE — Patient Instructions (Signed)
Preventive Care for Adults A healthy lifestyle and preventive care can promote health and wellness. Preventive health guidelines for women include the following key practices.  A routine yearly physical is a good way to check with your health care provider about your health and preventive screening. It is a chance to share any concerns and updates on your health and to receive a thorough exam.  Visit your dentist for a routine exam and preventive care every 6 months. Brush your teeth twice a day and floss once a day. Good oral hygiene prevents tooth decay and gum disease.  The frequency of eye exams is based on your age, health, family medical history, use of contact lenses, and other factors. Follow your health care provider's recommendations for frequency of eye exams.  Eat a healthy diet. Foods like vegetables, fruits, whole grains, low-fat dairy products, and lean protein foods contain the nutrients you need without too many calories. Decrease your intake of foods high in solid fats, added sugars, and salt. Eat the right amount of calories for you.Get information about a proper diet from your health care provider, if necessary.  Regular physical exercise is one of the most important things you can do for your health. Most adults should get at least 150 minutes of moderate-intensity exercise (any activity that increases your heart rate and causes you to sweat) each week. In addition, most adults need muscle-strengthening exercises on 2 or more days a week.  Maintain a healthy weight. The body mass index (BMI) is a screening tool to identify possible weight problems. It provides an estimate of body fat based on height and weight. Your health care provider can find your BMI and can help you achieve or maintain a healthy weight.For adults 20 years and older:  A BMI below 18.5 is considered underweight.  A BMI of 18.5 to 24.9 is normal.  A BMI of 25 to 29.9 is considered overweight.  A BMI of  30 and above is considered obese.  Maintain normal blood lipids and cholesterol levels by exercising and minimizing your intake of saturated fat. Eat a balanced diet with plenty of fruit and vegetables. Blood tests for lipids and cholesterol should begin at age 20 and be repeated every 5 years. If your lipid or cholesterol levels are high, you are over 50, or you are at high risk for heart disease, you may need your cholesterol levels checked more frequently.Ongoing high lipid and cholesterol levels should be treated with medicines if diet and exercise are not working.  If you smoke, find out from your health care provider how to quit. If you do not use tobacco, do not start.  Lung cancer screening is recommended for adults aged 55-80 years who are at high risk for developing lung cancer because of a history of smoking. A yearly low-dose CT scan of the lungs is recommended for people who have at least a 30-pack-year history of smoking and are a current smoker or have quit within the past 15 years. A pack year of smoking is smoking an average of 1 pack of cigarettes a day for 1 year (for example: 1 pack a day for 30 years or 2 packs a day for 15 years). Yearly screening should continue until the smoker has stopped smoking for at least 15 years. Yearly screening should be stopped for people who develop a health problem that would prevent them from having lung cancer treatment.  If you are pregnant, do not drink alcohol. If you are breastfeeding,   be very cautious about drinking alcohol. If you are not pregnant and choose to drink alcohol, do not have more than 1 drink per day. One drink is considered to be 12 ounces (355 mL) of beer, 5 ounces (148 mL) of wine, or 1.5 ounces (44 mL) of liquor.  Avoid use of street drugs. Do not share needles with anyone. Ask for help if you need support or instructions about stopping the use of drugs.  High blood pressure causes heart disease and increases the risk of  stroke. Your blood pressure should be checked at least every 1 to 2 years. Ongoing high blood pressure should be treated with medicines if weight loss and exercise do not work.  If you are 75-52 years old, ask your health care provider if you should take aspirin to prevent strokes.  Diabetes screening involves taking a blood sample to check your fasting blood sugar level. This should be done once every 3 years, after age 15, if you are within normal weight and without risk factors for diabetes. Testing should be considered at a younger age or be carried out more frequently if you are overweight and have at least 1 risk factor for diabetes.  Breast cancer screening is essential preventive care for women. You should practice "breast self-awareness." This means understanding the normal appearance and feel of your breasts and may include breast self-examination. Any changes detected, no matter how small, should be reported to a health care provider. Women in their 58s and 30s should have a clinical breast exam (CBE) by a health care provider as part of a regular health exam every 1 to 3 years. After age 16, women should have a CBE every year. Starting at age 53, women should consider having a mammogram (breast X-ray test) every year. Women who have a family history of breast cancer should talk to their health care provider about genetic screening. Women at a high risk of breast cancer should talk to their health care providers about having an MRI and a mammogram every year.  Breast cancer gene (BRCA)-related cancer risk assessment is recommended for women who have family members with BRCA-related cancers. BRCA-related cancers include breast, ovarian, tubal, and peritoneal cancers. Having family members with these cancers may be associated with an increased risk for harmful changes (mutations) in the breast cancer genes BRCA1 and BRCA2. Results of the assessment will determine the need for genetic counseling and  BRCA1 and BRCA2 testing.  Routine pelvic exams to screen for cancer are no longer recommended for nonpregnant women who are considered low risk for cancer of the pelvic organs (ovaries, uterus, and vagina) and who do not have symptoms. Ask your health care provider if a screening pelvic exam is right for you.  If you have had past treatment for cervical cancer or a condition that could lead to cancer, you need Pap tests and screening for cancer for at least 20 years after your treatment. If Pap tests have been discontinued, your risk factors (such as having a new sexual partner) need to be reassessed to determine if screening should be resumed. Some women have medical problems that increase the chance of getting cervical cancer. In these cases, your health care provider may recommend more frequent screening and Pap tests.  The HPV test is an additional test that may be used for cervical cancer screening. The HPV test looks for the virus that can cause the cell changes on the cervix. The cells collected during the Pap test can be  tested for HPV. The HPV test could be used to screen women aged 30 years and older, and should be used in women of any age who have unclear Pap test results. After the age of 30, women should have HPV testing at the same frequency as a Pap test.  Colorectal cancer can be detected and often prevented. Most routine colorectal cancer screening begins at the age of 50 years and continues through age 75 years. However, your health care provider may recommend screening at an earlier age if you have risk factors for colon cancer. On a yearly basis, your health care provider may provide home test kits to check for hidden blood in the stool. Use of a small camera at the end of a tube, to directly examine the colon (sigmoidoscopy or colonoscopy), can detect the earliest forms of colorectal cancer. Talk to your health care provider about this at age 50, when routine screening begins. Direct  exam of the colon should be repeated every 5-10 years through age 75 years, unless early forms of pre-cancerous polyps or small growths are found.  People who are at an increased risk for hepatitis B should be screened for this virus. You are considered at high risk for hepatitis B if:  You were born in a country where hepatitis B occurs often. Talk with your health care provider about which countries are considered high risk.  Your parents were born in a high-risk country and you have not received a shot to protect against hepatitis B (hepatitis B vaccine).  You have HIV or AIDS.  You use needles to inject street drugs.  You live with, or have sex with, someone who has hepatitis B.  You get hemodialysis treatment.  You take certain medicines for conditions like cancer, organ transplantation, and autoimmune conditions.  Hepatitis C blood testing is recommended for all people born from 1945 through 1965 and any individual with known risks for hepatitis C.  Practice safe sex. Use condoms and avoid high-risk sexual practices to reduce the spread of sexually transmitted infections (STIs). STIs include gonorrhea, chlamydia, syphilis, trichomonas, herpes, HPV, and human immunodeficiency virus (HIV). Herpes, HIV, and HPV are viral illnesses that have no cure. They can result in disability, cancer, and death.  You should be screened for sexually transmitted illnesses (STIs) including gonorrhea and chlamydia if:  You are sexually active and are younger than 24 years.  You are older than 24 years and your health care provider tells you that you are at risk for this type of infection.  Your sexual activity has changed since you were last screened and you are at an increased risk for chlamydia or gonorrhea. Ask your health care provider if you are at risk.  If you are at risk of being infected with HIV, it is recommended that you take a prescription medicine daily to prevent HIV infection. This is  called preexposure prophylaxis (PrEP). You are considered at risk if:  You are a heterosexual woman, are sexually active, and are at increased risk for HIV infection.  You take drugs by injection.  You are sexually active with a partner who has HIV.  Talk with your health care provider about whether you are at high risk of being infected with HIV. If you choose to begin PrEP, you should first be tested for HIV. You should then be tested every 3 months for as long as you are taking PrEP.  Osteoporosis is a disease in which the bones lose minerals and strength   with aging. This can result in serious bone fractures or breaks. The risk of osteoporosis can be identified using a bone density scan. Women ages 65 years and over and women at risk for fractures or osteoporosis should discuss screening with their health care providers. Ask your health care provider whether you should take a calcium supplement or vitamin D to reduce the rate of osteoporosis.  Menopause can be associated with physical symptoms and risks. Hormone replacement therapy is available to decrease symptoms and risks. You should talk to your health care provider about whether hormone replacement therapy is right for you.  Use sunscreen. Apply sunscreen liberally and repeatedly throughout the day. You should seek shade when your shadow is shorter than you. Protect yourself by wearing long sleeves, pants, a wide-brimmed hat, and sunglasses year round, whenever you are outdoors.  Once a month, do a whole body skin exam, using a mirror to look at the skin on your back. Tell your health care provider of new moles, moles that have irregular borders, moles that are larger than a pencil eraser, or moles that have changed in shape or color.  Stay current with required vaccines (immunizations).  Influenza vaccine. All adults should be immunized every year.  Tetanus, diphtheria, and acellular pertussis (Td, Tdap) vaccine. Pregnant women should  receive 1 dose of Tdap vaccine during each pregnancy. The dose should be obtained regardless of the length of time since the last dose. Immunization is preferred during the 27th-36th week of gestation. An adult who has not previously received Tdap or who does not know her vaccine status should receive 1 dose of Tdap. This initial dose should be followed by tetanus and diphtheria toxoids (Td) booster doses every 10 years. Adults with an unknown or incomplete history of completing a 3-dose immunization series with Td-containing vaccines should begin or complete a primary immunization series including a Tdap dose. Adults should receive a Td booster every 10 years.  Varicella vaccine. An adult without evidence of immunity to varicella should receive 2 doses or a second dose if she has previously received 1 dose. Pregnant females who do not have evidence of immunity should receive the first dose after pregnancy. This first dose should be obtained before leaving the health care facility. The second dose should be obtained 4-8 weeks after the first dose.  Human papillomavirus (HPV) vaccine. Females aged 13-26 years who have not received the vaccine previously should obtain the 3-dose series. The vaccine is not recommended for use in pregnant females. However, pregnancy testing is not needed before receiving a dose. If a female is found to be pregnant after receiving a dose, no treatment is needed. In that case, the remaining doses should be delayed until after the pregnancy. Immunization is recommended for any person with an immunocompromised condition through the age of 26 years if she did not get any or all doses earlier. During the 3-dose series, the second dose should be obtained 4-8 weeks after the first dose. The third dose should be obtained 24 weeks after the first dose and 16 weeks after the second dose.  Zoster vaccine. One dose is recommended for adults aged 60 years or older unless certain conditions are  present.  Measles, mumps, and rubella (MMR) vaccine. Adults born before 1957 generally are considered immune to measles and mumps. Adults born in 1957 or later should have 1 or more doses of MMR vaccine unless there is a contraindication to the vaccine or there is laboratory evidence of immunity to   each of the three diseases. A routine second dose of MMR vaccine should be obtained at least 28 days after the first dose for students attending postsecondary schools, health care workers, or international travelers. People who received inactivated measles vaccine or an unknown type of measles vaccine during 1963-1967 should receive 2 doses of MMR vaccine. People who received inactivated mumps vaccine or an unknown type of mumps vaccine before 1979 and are at high risk for mumps infection should consider immunization with 2 doses of MMR vaccine. For females of childbearing age, rubella immunity should be determined. If there is no evidence of immunity, females who are not pregnant should be vaccinated. If there is no evidence of immunity, females who are pregnant should delay immunization until after pregnancy. Unvaccinated health care workers born before 1957 who lack laboratory evidence of measles, mumps, or rubella immunity or laboratory confirmation of disease should consider measles and mumps immunization with 2 doses of MMR vaccine or rubella immunization with 1 dose of MMR vaccine.  Pneumococcal 13-valent conjugate (PCV13) vaccine. When indicated, a person who is uncertain of her immunization history and has no record of immunization should receive the PCV13 vaccine. An adult aged 19 years or older who has certain medical conditions and has not been previously immunized should receive 1 dose of PCV13 vaccine. This PCV13 should be followed with a dose of pneumococcal polysaccharide (PPSV23) vaccine. The PPSV23 vaccine dose should be obtained at least 8 weeks after the dose of PCV13 vaccine. An adult aged 19  years or older who has certain medical conditions and previously received 1 or more doses of PPSV23 vaccine should receive 1 dose of PCV13. The PCV13 vaccine dose should be obtained 1 or more years after the last PPSV23 vaccine dose.  Pneumococcal polysaccharide (PPSV23) vaccine. When PCV13 is also indicated, PCV13 should be obtained first. All adults aged 65 years and older should be immunized. An adult younger than age 65 years who has certain medical conditions should be immunized. Any person who resides in a nursing home or long-term care facility should be immunized. An adult smoker should be immunized. People with an immunocompromised condition and certain other conditions should receive both PCV13 and PPSV23 vaccines. People with human immunodeficiency virus (HIV) infection should be immunized as soon as possible after diagnosis. Immunization during chemotherapy or radiation therapy should be avoided. Routine use of PPSV23 vaccine is not recommended for American Indians, [place] Natives, or people younger than 65 years unless there are medical conditions that require PPSV23 vaccine. When indicated, people who have unknown immunization and have no record of immunization should receive PPSV23 vaccine. One-time revaccination 5 years after the first dose of PPSV23 is recommended for people aged 19-64 years who have chronic kidney failure, nephrotic syndrome, asplenia, or immunocompromised conditions. People who received 1-2 doses of PPSV23 before age 65 years should receive another dose of PPSV23 vaccine at age 65 years or later if at least 5 years have passed since the previous dose. Doses of PPSV23 are not needed for people immunized with PPSV23 at or after age 65 years.  Meningococcal vaccine. Adults with asplenia or persistent complement component deficiencies should receive 2 doses of quadrivalent meningococcal conjugate (MenACWY-D) vaccine. The doses should be obtained at least 2 months apart.  Microbiologists working with certain meningococcal bacteria, military recruits, people at risk during an outbreak, and people who travel to or live in countries with a high rate of meningitis should be immunized. A first-year college student up through age   21 years who is living in a residence hall should receive a dose if she did not receive a dose on or after her 16th birthday. Adults who have certain high-risk conditions should receive one or more doses of vaccine.  Hepatitis A vaccine. Adults who wish to be protected from this disease, have certain high-risk conditions, work with hepatitis A-infected animals, work in hepatitis A research labs, or travel to or work in countries with a high rate of hepatitis A should be immunized. Adults who were previously unvaccinated and who anticipate close contact with an international adoptee during the first 60 days after arrival in the [place] from a country with a high rate of hepatitis A should be immunized.  Hepatitis B vaccine. Adults who wish to be protected from this disease, have certain high-risk conditions, may be exposed to blood or other infectious body fluids, are household contacts or sex partners of hepatitis B positive people, are clients or workers in certain care facilities, or travel to or work in countries with a high rate of hepatitis B should be immunized.  Haemophilus influenzae type b (Hib) vaccine. A previously unvaccinated person with asplenia or sickle cell disease or having a scheduled splenectomy should receive 1 dose of Hib vaccine. Regardless of previous immunization, a recipient of a hematopoietic stem cell transplant should receive a 3-dose series 6-12 months after her successful transplant. Hib vaccine is not recommended for adults with HIV infection. Preventive Services / Frequency Ages 64 to 68 years  Blood pressure check.** / Every 1 to 2 years.  Lipid and cholesterol check.** / Every 5 years beginning at age  22.  Clinical breast exam.** / Every 3 years for women in their 88s and 53s.  BRCA-related cancer risk assessment.** / For women who have family members with a BRCA-related cancer (breast, ovarian, tubal, or peritoneal cancers).  Pap test.** / Every 2 years from ages 90 through 51. Every 3 years starting at age 21 through age 56 or 3 with a history of 3 consecutive normal Pap tests.  HPV screening.** / Every 3 years from ages 24 through ages 1 to 46 with a history of 3 consecutive normal Pap tests.  Hepatitis C blood test.** / For any individual with known risks for hepatitis C.  Skin self-exam. / Monthly.  Influenza vaccine. / Every year.  Tetanus, diphtheria, and acellular pertussis (Tdap, Td) vaccine.** / Consult your health care provider. Pregnant women should receive 1 dose of Tdap vaccine during each pregnancy. 1 dose of Td every 10 years.  Varicella vaccine.** / Consult your health care provider. Pregnant females who do not have evidence of immunity should receive the first dose after pregnancy.  HPV vaccine. / 3 doses over 6 months, if 72 and younger. The vaccine is not recommended for use in pregnant females. However, pregnancy testing is not needed before receiving a dose.  Measles, mumps, rubella (MMR) vaccine.** / You need at least 1 dose of MMR if you were born in 1957 or later. You may also need a 2nd dose. For females of childbearing age, rubella immunity should be determined. If there is no evidence of immunity, females who are not pregnant should be vaccinated. If there is no evidence of immunity, females who are pregnant should delay immunization until after pregnancy.  Pneumococcal 13-valent conjugate (PCV13) vaccine.** / Consult your health care provider.  Pneumococcal polysaccharide (PPSV23) vaccine.** / 1 to 2 doses if you smoke cigarettes or if you have certain conditions.  Meningococcal vaccine.** /  1 dose if you are age 19 to 21 years and a first-year college  student living in a residence hall, or have one of several medical conditions, you need to get vaccinated against meningococcal disease. You may also need additional booster doses.  Hepatitis A vaccine.** / Consult your health care provider.  Hepatitis B vaccine.** / Consult your health care provider.  Haemophilus influenzae type b (Hib) vaccine.** / Consult your health care provider. Ages 40 to 64 years  Blood pressure check.** / Every 1 to 2 years.  Lipid and cholesterol check.** / Every 5 years beginning at age 20 years.  Lung cancer screening. / Every year if you are aged 55-80 years and have a 30-pack-year history of smoking and currently smoke or have quit within the past 15 years. Yearly screening is stopped once you have quit smoking for at least 15 years or develop a health problem that would prevent you from having lung cancer treatment.  Clinical breast exam.** / Every year after age 40 years.  BRCA-related cancer risk assessment.** / For women who have family members with a BRCA-related cancer (breast, ovarian, tubal, or peritoneal cancers).  Mammogram.** / Every year beginning at age 40 years and continuing for as long as you are in good health. Consult with your health care provider.  Pap test.** / Every 3 years starting at age 30 years through age 65 or 70 years with a history of 3 consecutive normal Pap tests.  HPV screening.** / Every 3 years from ages 30 years through ages 65 to 70 years with a history of 3 consecutive normal Pap tests.  Fecal occult blood test (FOBT) of stool. / Every year beginning at age 50 years and continuing until age 75 years. You may not need to do this test if you get a colonoscopy every 10 years.  Flexible sigmoidoscopy or colonoscopy.** / Every 5 years for a flexible sigmoidoscopy or every 10 years for a colonoscopy beginning at age 50 years and continuing until age 75 years.  Hepatitis C blood test.** / For all people born from 1945 through  1965 and any individual with known risks for hepatitis C.  Skin self-exam. / Monthly.  Influenza vaccine. / Every year.  Tetanus, diphtheria, and acellular pertussis (Tdap/Td) vaccine.** / Consult your health care provider. Pregnant women should receive 1 dose of Tdap vaccine during each pregnancy. 1 dose of Td every 10 years.  Varicella vaccine.** / Consult your health care provider. Pregnant females who do not have evidence of immunity should receive the first dose after pregnancy.  Zoster vaccine.** / 1 dose for adults aged 60 years or older.  Measles, mumps, rubella (MMR) vaccine.** / You need at least 1 dose of MMR if you were born in 1957 or later. You may also need a 2nd dose. For females of childbearing age, rubella immunity should be determined. If there is no evidence of immunity, females who are not pregnant should be vaccinated. If there is no evidence of immunity, females who are pregnant should delay immunization until after pregnancy.  Pneumococcal 13-valent conjugate (PCV13) vaccine.** / Consult your health care provider.  Pneumococcal polysaccharide (PPSV23) vaccine.** / 1 to 2 doses if you smoke cigarettes or if you have certain conditions.  Meningococcal vaccine.** / Consult your health care provider.  Hepatitis A vaccine.** / Consult your health care provider.  Hepatitis B vaccine.** / Consult your health care provider.  Haemophilus influenzae type b (Hib) vaccine.** / Consult your health care provider. Ages 65   years and over  Blood pressure check.** / Every 1 to 2 years.  Lipid and cholesterol check.** / Every 5 years beginning at age 22 years.  Lung cancer screening. / Every year if you are aged 73-80 years and have a 30-pack-year history of smoking and currently smoke or have quit within the past 15 years. Yearly screening is stopped once you have quit smoking for at least 15 years or develop a health problem that would prevent you from having lung cancer  treatment.  Clinical breast exam.** / Every year after age 4 years.  BRCA-related cancer risk assessment.** / For women who have family members with a BRCA-related cancer (breast, ovarian, tubal, or peritoneal cancers).  Mammogram.** / Every year beginning at age 40 years and continuing for as long as you are in good health. Consult with your health care provider.  Pap test.** / Every 3 years starting at age 9 years through age 34 or 91 years with 3 consecutive normal Pap tests. Testing can be stopped between 65 and 70 years with 3 consecutive normal Pap tests and no abnormal Pap or HPV tests in the past 10 years.  HPV screening.** / Every 3 years from ages 57 years through ages 64 or 45 years with a history of 3 consecutive normal Pap tests. Testing can be stopped between 65 and 70 years with 3 consecutive normal Pap tests and no abnormal Pap or HPV tests in the past 10 years.  Fecal occult blood test (FOBT) of stool. / Every year beginning at age 15 years and continuing until age 17 years. You may not need to do this test if you get a colonoscopy every 10 years.  Flexible sigmoidoscopy or colonoscopy.** / Every 5 years for a flexible sigmoidoscopy or every 10 years for a colonoscopy beginning at age 86 years and continuing until age 71 years.  Hepatitis C blood test.** / For all people born from [birth year] through 1964 and any individual with known risks for hepatitis C.  Osteoporosis screening.** / A one-time screening for women ages 83 years and over and women at risk for fractures or osteoporosis.  Skin self-exam. / Monthly.  Influenza vaccine. / Every year.  Tetanus, diphtheria, and acellular pertussis (Tdap/Td) vaccine.** / 1 dose of Td every 10 years.  Varicella vaccine.** / Consult your health care provider.  Zoster vaccine.** / 1 dose for adults aged 61 years or older.  Pneumococcal 13-valent conjugate (PCV13) vaccine.** / Consult your health care provider.  Pneumococcal  polysaccharide (PPSV23) vaccine.** / 1 dose for all adults aged 28 years and older.  Meningococcal vaccine.** / Consult your health care provider.  Hepatitis A vaccine.** / Consult your health care provider.  Hepatitis B vaccine.** / Consult your health care provider.  Haemophilus influenzae type b (Hib) vaccine.** / Consult your health care provider. ** Family history and personal history of risk and conditions may change your health care provider's recommendations. Document Released: 05/02/2001 Document Revised: 07/21/2013 Document Reviewed: 08/01/2010 [facility] Patient Information 2015 [place], [place]. This information is not intended to replace advice given to you by your health care provider. Make sure you discuss any questions you have with your health care provider.

## 2013-11-21 LAB — CYTOLOGY - PAP

## 2014-01-19 ENCOUNTER — Encounter: Payer: Self-pay | Admitting: Obstetrics & Gynecology

## 2014-02-07 IMAGING — US US OB FOLLOW-UP
1 series · 12 of 22 positions shown · non-contrast
Comparison: none

[Series 1: us ob follow up · 22 acquisitions, 12 frames shown]
[im 1/22]
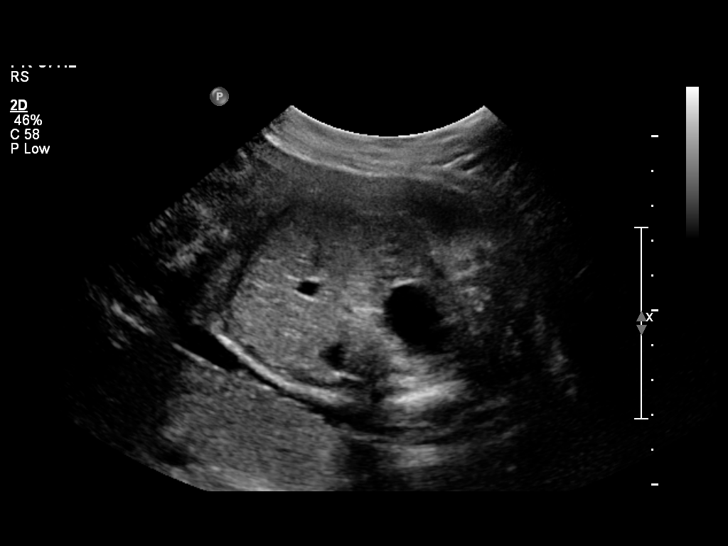
[im 3/22]
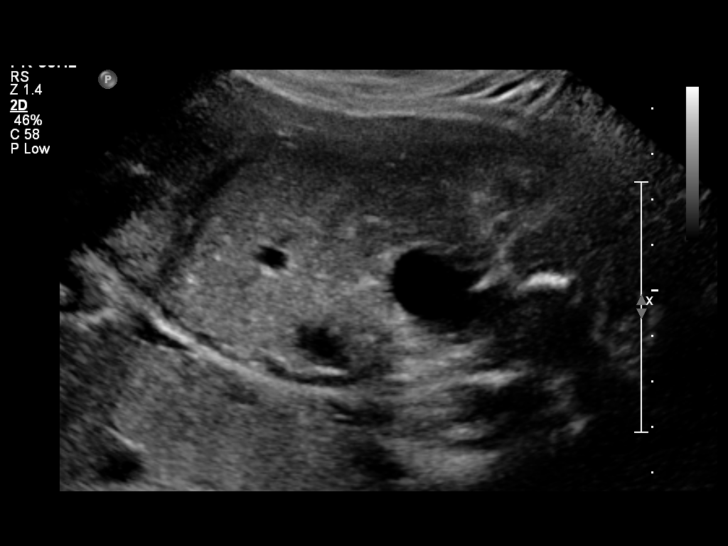
[im 5/22]
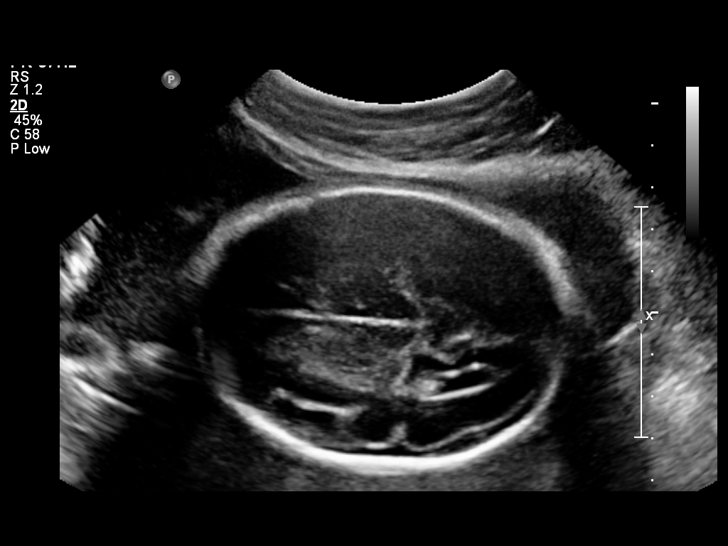
[im 7/22]
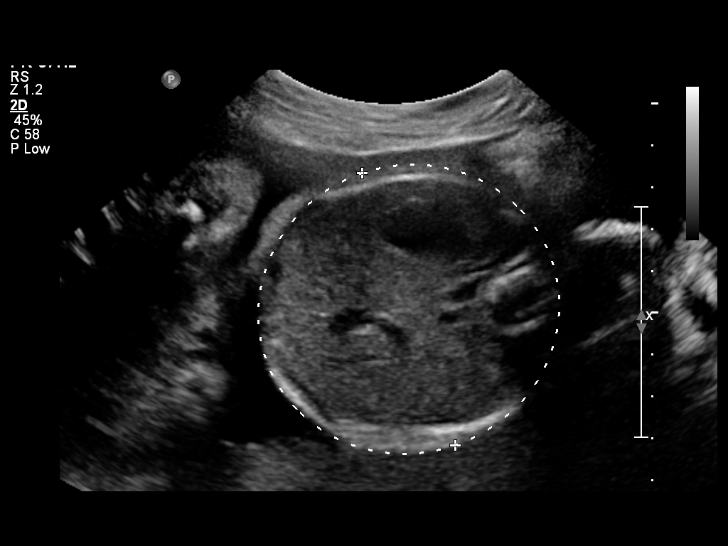
[im 9/22]
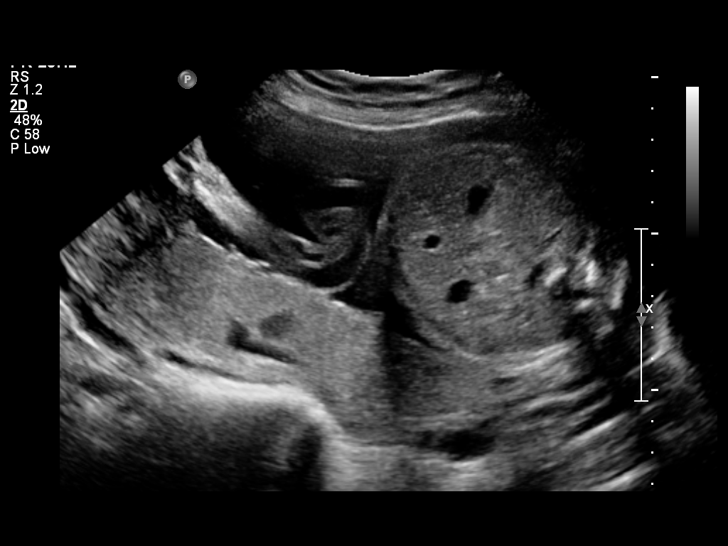
[im 11/22]
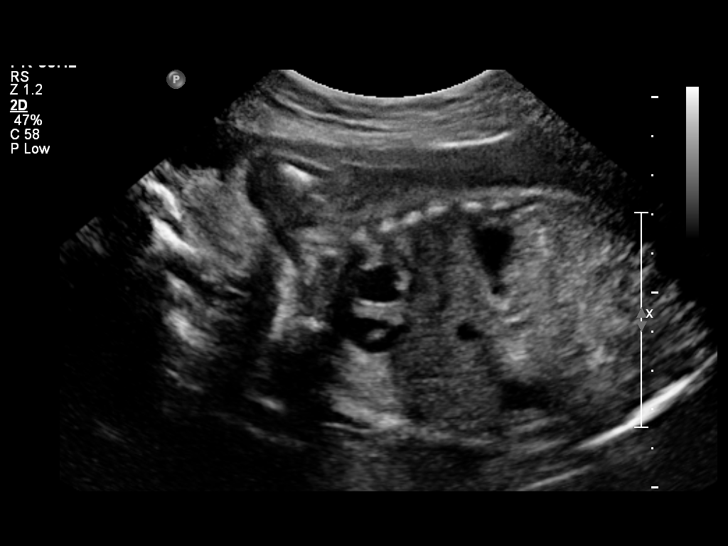
[im 12/22]
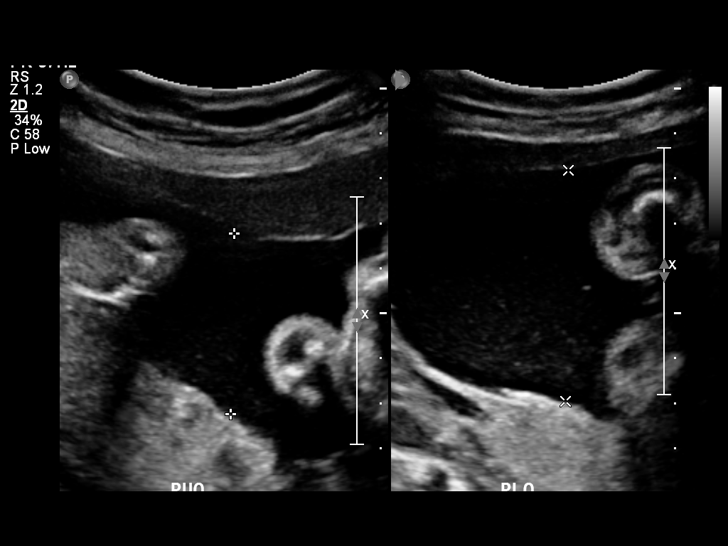
[im 14/22]
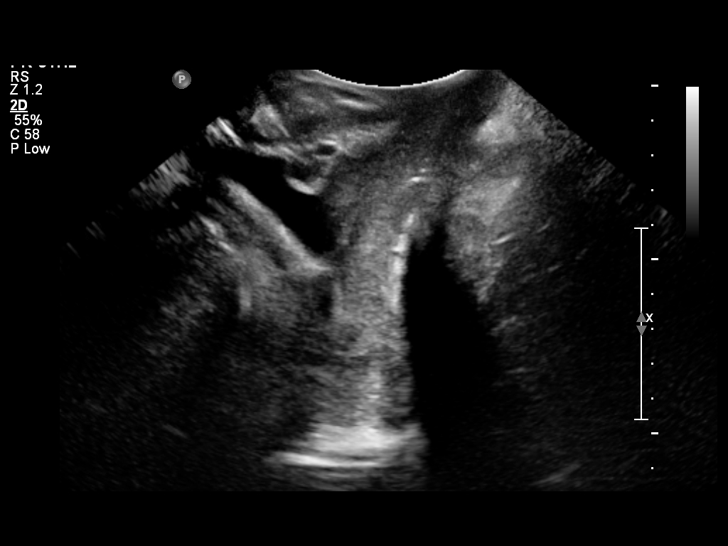
[im 16/22]
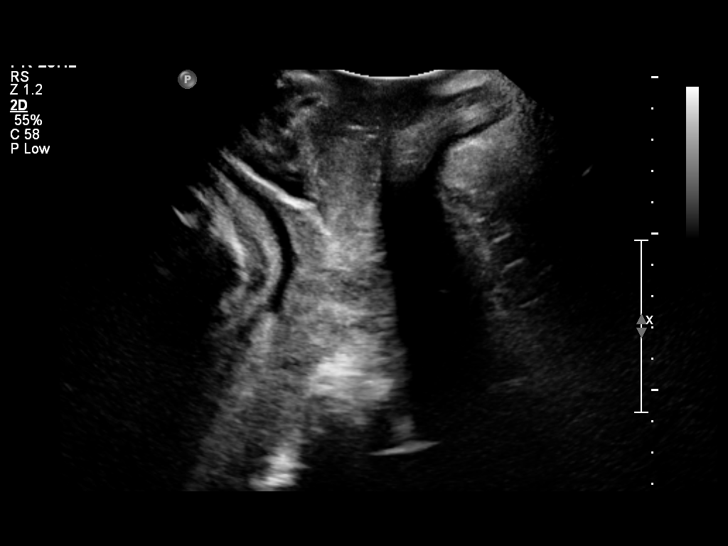
[im 18/22]
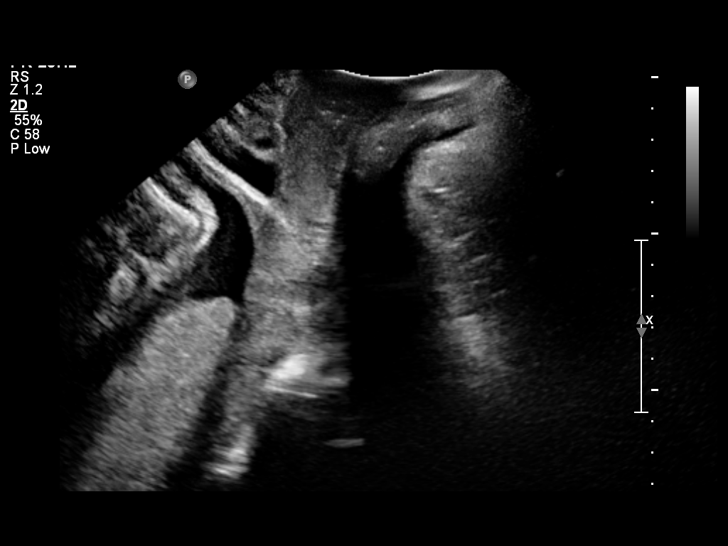
[im 20/22]
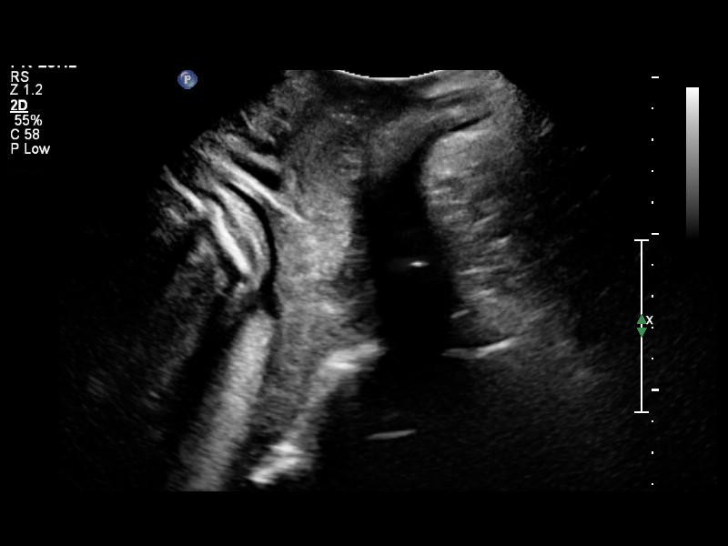
[im 22/22]
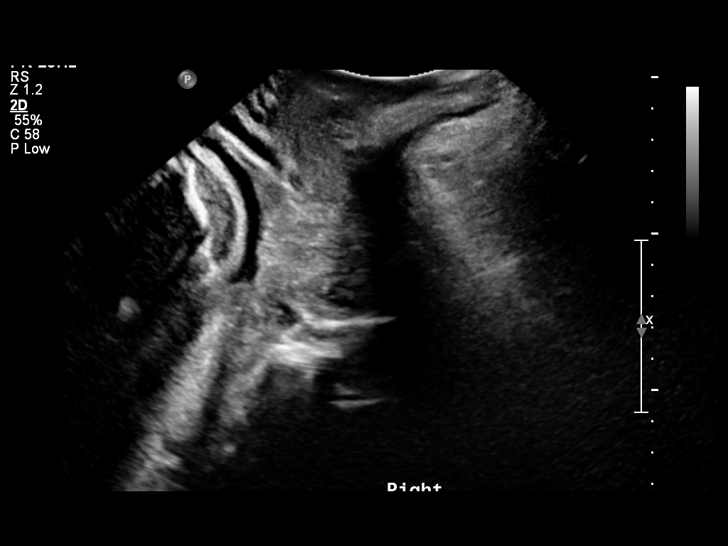

[12 of 22 positions shown; findings below may reference images not displayed]

OBSTETRICS REPORT
                      (Signed Final 09/30/2012 [DATE])

Service(s) Provided

 US OB FOLLOW UP                                       76816.1
Indications

 Placenta previa/Low lying: No bleeding
 Size less than dates (Small for gestational [AGE]
 FGR)
 Previous cesarean section
Fetal Evaluation

 Num Of Fetuses:    1
 Fetal Heart Rate:  152                         bpm
 Cardiac Activity:  Observed
 Presentation:      Breech
 Placenta:          Posterior, low-lying,
                    _1.6cm from int os

 Amniotic Fluid
 AFI FV:      Subjectively within normal limits
 AFI Sum:     15.37   cm      54   %Tile     Larg Pckt:     5.1  cm
 RUQ:   3.98   cm    RLQ:    5.1    cm    LUQ:   3.59    cm   LLQ:    2.7    cm
Biometry

 BPD:     64.4  mm    G. Age:   26w 0d                CI:         73.0   70 - 86
 OFD:     88.2  mm                                    FL/HC:      19.4   18.8 -

 HC:     249.5  mm    G. Age:   27w 0d       11  %    HC/AC:      1.13   1.05 -

 AC:     220.3  mm    G. Age:   26w 3d       15  %    FL/BPD:     75.3   71 - 87
 FL:      48.5  mm    G. Age:   26w 2d        8  %    FL/AC:      22.0   20 - 24

 Est. FW:     936  gm      2 lb 1 oz     28  %
Gestational Age

 LMP:           27w 4d       Date:   03/21/12                 EDD:   12/26/12
 U/S Today:     26w 3d                                        EDD:   01/03/13
 Best:          27w 4d    Det. By:   LMP  (03/21/12)          EDD:   12/26/12
Anatomy

 Cranium:          Appears normal         Aortic Arch:      Previously seen
 Fetal Cavum:      Appears normal         Ductal Arch:      Previously seen
 Ventricles:       Appears normal         Diaphragm:        Appears normal
 Choroid Plexus:   Previously seen        Stomach:          Appears normal, left
                                                            sided
 Cerebellum:       Previously seen        Abdomen:          Previously seen
 Posterior Fossa:  Previously seen        Abdominal Wall:   Previously seen
 Nuchal Fold:      Not applicable (>20    Cord Vessels:     Previously seen
                   wks GA)
 Face:             Orbits and profile     Kidneys:          Appear normal
                   previously seen
 Lips:             Previously seen        Bladder:          Appears normal
 Heart:            Previously seen        Spine:            Previously seen
 RVOT:             Previously seen        Lower             Previously seen
                                          Extremities:
 LVOT:             Previously seen        Upper             Previously seen
                                          Extremities:
Cervix Uterus Adnexa

 Cervical Length:   4.95      cm

 Cervix:       Normal appearance by translabial scan.
Impression

 Single living intrauterine pregnancy in breech presentation.
 Low-lying posterior placenta, 1.6 cm from the internal os.
 Amniotic fluid index is 15.37 cm.
 Estimated fetal weight is 936g,  28th percentile for gestational
 age of   27w 4d.  Recommend f/u for growth.

## 2014-04-18 ENCOUNTER — Emergency Department: Payer: Self-pay | Admitting: Student

## 2014-04-18 LAB — URINALYSIS, COMPLETE
BLOOD: NEGATIVE
Bilirubin,UR: NEGATIVE
Glucose,UR: NEGATIVE mg/dL (ref 0–75)
Hyaline Cast: 1
Ketone: NEGATIVE
Nitrite: NEGATIVE
PH: 5 (ref 4.5–8.0)
Specific Gravity: 1.03 (ref 1.003–1.030)
Squamous Epithelial: 7
WBC UR: 21 /HPF (ref 0–5)

## 2014-07-27 ENCOUNTER — Ambulatory Visit (INDEPENDENT_AMBULATORY_CARE_PROVIDER_SITE_OTHER): Payer: Self-pay | Admitting: Family Medicine

## 2014-07-27 ENCOUNTER — Encounter: Payer: Self-pay | Admitting: Family Medicine

## 2014-07-27 VITALS — BP 124/75 | HR 81 | Wt 174.0 lb

## 2014-07-27 DIAGNOSIS — N926 Irregular menstruation, unspecified: Secondary | ICD-10-CM | POA: Insufficient documentation

## 2014-07-27 NOTE — Progress Notes (Signed)
    Subjective:    Patient ID: [name] is a 29 y.o. female presenting with Menstrual Problem  on 07/27/2014  HPI: Irregular cycles since birth of last child.  She is still nursing. First cycle back at 29 year of age, and irregular since then. Sometimes 30 days between cycles and sometimes 24 days. Has started getting migraines. LMP 4/24.   Review of Systems  Constitutional: Negative for fever and chills.  Respiratory: Negative for shortness of breath.   Cardiovascular: Negative for chest pain.  Gastrointestinal: Negative for nausea, vomiting and abdominal pain.  Genitourinary: Negative for dysuria.  Skin: Negative for rash.      Objective:    BP 124/75 mmHg  Pulse 81  Wt 174 lb (78.926 kg)  LMP 07/14/2014  Breastfeeding? Yes Physical Exam  Constitutional: She is oriented to person, place, and time. She appears well-developed and well-nourished. No distress.  HENT:  Head: Normocephalic and atraumatic.  Eyes: No scleral icterus.  Neck: Neck supple.  Cardiovascular: Normal rate.   Pulmonary/Chest: Effort normal.  Abdominal: Soft.  Neurological: She is alert and oriented to person, place, and time.  Skin: Skin is warm and dry.  Psychiatric: She has a normal mood and affect.        Assessment & Plan:   Problem List Items Addressed This Visit      Unprioritized   Irregular menses - Primary    Most likely related to continued nursing.  Will check TSH to ensure that is normal.      Relevant Orders   TSH       Return in about 3 months (around 10/27/2014).  Ebb Carelock S 07/27/2014 2:51 PM

## 2014-07-27 NOTE — Assessment & Plan Note (Signed)
Most likely related to continued nursing.  Will check TSH to ensure that is normal.

## 2014-07-28 LAB — TSH: TSH: 1.809 u[IU]/mL (ref 0.350–4.500)

## 2014-09-23 ENCOUNTER — Encounter: Payer: Self-pay | Admitting: Family Medicine

## 2014-09-23 ENCOUNTER — Ambulatory Visit (INDEPENDENT_AMBULATORY_CARE_PROVIDER_SITE_OTHER): Payer: 59 | Admitting: Family Medicine

## 2014-09-23 VITALS — BP 109/74 | HR 82 | Temp 98.2°F | Resp 16 | Ht 67.0 in | Wt 171.0 lb

## 2014-09-23 DIAGNOSIS — Z7689 Persons encountering health services in other specified circumstances: Secondary | ICD-10-CM

## 2014-09-23 DIAGNOSIS — Z7189 Other specified counseling: Secondary | ICD-10-CM | POA: Diagnosis not present

## 2014-09-23 DIAGNOSIS — Z8249 Family history of ischemic heart disease and other diseases of the circulatory system: Secondary | ICD-10-CM

## 2014-09-23 DIAGNOSIS — G43009 Migraine without aura, not intractable, without status migrainosus: Secondary | ICD-10-CM

## 2014-09-23 MED ORDER — ESCITALOPRAM OXALATE 10 MG PO TABS: 10.0000 mg | ORAL_TABLET | Freq: Every day | ORAL | Status: DC

## 2014-09-23 MED ORDER — SUMATRIPTAN SUCCINATE 50 MG PO TABS: 50.0000 mg | ORAL_TABLET | ORAL | Status: DC | PRN

## 2014-09-23 NOTE — Progress Notes (Signed)
Subjective:    Patient ID: [name], female    DOB: [birth year], 29 y.o.   MRN: [id number]  HPI: Linda PaisStephanie N Shinn is a 29 y.o. female presenting on 09/23/2014 for Establish Care   HPI  Pt presents to establish care today. Previous care provider  Was her GYN that she was using as PCP. [facility].  Current medical problems include: Anxiety: Was previously on medication during her second pregnancy. No current struggles with anxiety.  Irregular menses: Due to breast feeding. Tubes tied for contraceptions. Migraines: Diagnosed in childhood. Had period prior to having children where migraines were not bothersome, have since returned after having her children. No aura with migraines. HA is accompanied by nausea and light sensitivity. Pain is located starting the neck and moving up the head. Constant pain during HA. Currently getting HA at least 3 times per week. No relation to menses. Pt has stopped drinking a lot of caffiene and has had mild relief. Pt usually has HA in the AM. HA do not wake her up. Treats tylenol and Ibuprofen at home. Has used imitrex in the past to treat her migraines. Pt was previously taking lexapro to prevent her HA with good relief.   Pt is still currently breastfeeding her youngest child. She is almost 3 and gets most of her calories from food. Breast feeding is brief and on-demand.   Health maintenance:  Pap- Oct 2015- normal. Unsure if HPV co-testing was.  No family history of breast cancer.  Stay at home mom. Pt is trying to exercise- is struggling to lose weight. She works out 5 days per week.    Past Medical History  Diagnosis Date  . Rh negative state in antepartum period   . Abnormal Pap smear   . Migraine    History   Social History  . Marital Status: Married    Spouse Name: N/A  . Number of Children: N/A  . Years of Education: N/A   Occupational History  . Not on file.   Social History Main Topics   . Smoking status: Never Smoker   . Smokeless tobacco: Never Used  . Alcohol Use: No  . Drug Use: No  . Sexual Activity:    Partners: Male    Birth Control/ Protection: None   Other Topics Concern  . Not on file   Social History Narrative   Family History  Problem Relation Age of Onset  . Diabetes Maternal Grandmother   . Diabetes Maternal Grandfather   . Anesthesia problems Neg Hx   . Hypotension Neg Hx   . Malignant hyperthermia Neg Hx   . Pseudochol deficiency Neg Hx   . Depression Mother   . Alcohol abuse Father    No current outpatient prescriptions on file prior to visit.   No current facility-administered medications on file prior to visit.    Review of Systems  Constitutional: Negative for fever and chills.  Eyes: Positive for photophobia (during migraine).  Respiratory: Negative for chest tightness, shortness of breath and wheezing.   Cardiovascular: Negative for chest pain, palpitations and leg swelling.  Gastrointestinal: Negative for nausea, vomiting and abdominal pain.  Genitourinary: Negative for dysuria, vaginal discharge and vaginal pain.  Neurological: Positive for headaches. Negative for dizziness, syncope, weakness, light-headedness and numbness.  Psychiatric/Behavioral: Negative for agitation. The patient is not nervous/anxious.    Per HPI unless specifically indicated above     Objective:    BP  109/74 mmHg  Pulse 82  Temp(Src) 98.2 F (36.8 C) (Oral)  Resp 16  Ht  (1.702 m)  Wt 171 lb (77.565 kg)  BMI 26.78 kg/m2  LMP 08/30/2014  Wt Readings from Last 3 Encounters:  09/23/14 171 lb (77.565 kg)  07/27/14 174 lb (78.926 kg)  11/18/13 157 lb (71.215 kg)    Physical Exam  Constitutional: She appears well-developed and well-nourished. No distress.  HENT:  Head: Normocephalic and atraumatic.  Neck: Normal range of motion. Neck supple.  Cardiovascular: Normal rate and regular rhythm.  Exam reveals no gallop and no friction rub.   No  murmur heard. Pulmonary/Chest: Effort normal and breath sounds normal. No respiratory distress. She has no wheezes. She exhibits no tenderness.  Abdominal: Soft. Bowel sounds are normal. There is no hepatosplenomegaly. There is no tenderness.  1.5 finger breadths diastasis recti separation.  Lymphadenopathy:    She has no cervical adenopathy.  Neurological: She is alert. She has normal strength and normal reflexes. No cranial nerve deficit or sensory deficit. She displays a negative Romberg sign.  Skin: She is not diaphoretic.   Results for orders placed or performed in visit on 07/27/14  TSH  Result Value Ref Range   TSH 1.809 0.350 - 4.500 uIU/mL      Assessment & Plan:   Problem List Items Addressed This Visit      Cardiovascular and Mediastinum   Migraine headache without aura    Restart Lexapro as migraine prophylaxis. Imitrex PRN for severe HA. Pt cautioned against use more than 3-4 times per month.  Pt cautioned to avoid caffeine. Keep HA diary to identify HA triggers.   Per Lactmed both Lexapro and Imitrex should be safe for the breastfeeding toddler due to infrequency of breastfeeding and low levels excreted in milk. Pt advised to bring any possible side effects in daughter to the attention of pediatrician.       Relevant Medications   escitalopram (LEXAPRO) 10 MG tablet   SUMAtriptan (IMITREX) 50 MG tablet   Other Relevant Orders   Comprehensive Metabolic Panel (CMET)    Other Visit Diagnoses    Encounter to establish care    -  Primary    Family history of heart disease        Baseline lipid panel.     Relevant Orders    Lipid Profile       Meds ordered this encounter  Medications  . escitalopram (LEXAPRO) 10 MG tablet    Sig: Take 1 tablet (10 mg total) by mouth daily.    Dispense:  30 tablet    Refill:  11    Order Specific Question:  Supervising Provider    Answer:  Janeann Forehand [phone]  . SUMAtriptan (IMITREX) 50 MG tablet    Sig: Take 1 tablet  (50 mg total) by mouth as needed for migraine. May repeat in 2 hours if headache persists or recurs.    Dispense:  10 tablet    Refill:  4    Order Specific Question:  Supervising Provider    Answer:  Janeann Forehand [[id number]]      Follow up plan: Return in about 4 weeks (around 10/21/2014).

## 2014-09-23 NOTE — Patient Instructions (Addendum)
Let's trial Lexapro for your HA- take  daily. This appears to be safe for breastfeeding. However if you notice any changes/side effects in your child, please call the office.   Imitrex as needed for headaches. Use no more than 3-4x per month.

## 2014-09-23 NOTE — Assessment & Plan Note (Signed)
Restart Lexapro as migraine prophylaxis. Imitrex PRN for severe HA. Pt cautioned against use more than 3-4 times per month.  Pt cautioned to avoid caffeine. Keep HA diary to identify HA triggers.   Per Lactmed both Lexapro and Imitrex should be safe for the breastfeeding toddler due to infrequency of breastfeeding and low levels excreted in milk. Pt advised to bring any possible side effects in daughter to the attention of pediatrician.

## 2014-10-21 ENCOUNTER — Ambulatory Visit (INDEPENDENT_AMBULATORY_CARE_PROVIDER_SITE_OTHER): Payer: 59 | Admitting: Family Medicine

## 2014-10-21 ENCOUNTER — Encounter: Payer: Self-pay | Admitting: Family Medicine

## 2014-10-21 VITALS — BP 106/71 | HR 82 | Temp 98.2°F | Resp 16 | Ht 67.0 in | Wt 172.2 lb

## 2014-10-21 DIAGNOSIS — G43009 Migraine without aura, not intractable, without status migrainosus: Secondary | ICD-10-CM

## 2014-10-21 NOTE — Progress Notes (Signed)
   Subjective:    Patient ID: [name], female    DOB: 07-14-1985, 29 y.o.   MRN: [id number]  HPI: [name] is a 29 y.o. female presenting on 10/21/2014 for Headache   HPI  Pt presents for follow-up on lexapro that was started to help with migraines. Migraines are doing well but pt is reporting sexual side effects. No drowsiness, dizziness, or GI upset. No need to use imitrex. Migraines have stopped by reducing caffiene.   Past Medical History  Diagnosis Date  . Rh negative state in antepartum period   . Abnormal Pap smear   . Migraine     Current Outpatient Prescriptions on File Prior to Visit  Medication Sig  . escitalopram (LEXAPRO) 10 MG tablet Take 1 tablet (10 mg total) by mouth daily.  . SUMAtriptan (IMITREX) 50 MG tablet Take 1 tablet (50 mg total) by mouth as needed for migraine. May repeat in 2 hours if headache persists or recurs.   No current facility-administered medications on file prior to visit.    Review of Systems  Constitutional: Negative for fever and chills.  Eyes: Negative for photophobia.  Respiratory: Negative for chest tightness, shortness of breath and wheezing.   Cardiovascular: Negative for chest pain, palpitations and leg swelling.  Neurological: Negative for dizziness, syncope, weakness, light-headedness, numbness and headaches.  Psychiatric/Behavioral: Negative for agitation. The patient is not nervous/anxious.    Per HPI unless specifically indicated above     Objective:    BP 106/71 mmHg  Pulse 82  Temp(Src) 98.2 F (36.8 C) (Oral)  Resp 16  Ht  (1.702 m)  Wt 172 lb 3.2 oz (78.109 kg)  BMI 26.96 kg/m2  LMP 10/03/2014  Wt Readings from Last 3 Encounters:  10/21/14 172 lb 3.2 oz (78.109 kg)  09/23/14 171 lb (77.565 kg)  07/27/14 174 lb (78.926 kg)    Physical Exam  Constitutional: She appears well-developed and well-nourished. No distress.  Cardiovascular: Normal rate and regular rhythm.  Exam reveals no gallop  and no friction rub.   No murmur heard. Pulmonary/Chest: Effort normal and breath sounds normal.  Skin: She is not diaphoretic.  Psychiatric: She has a normal mood and affect. Her speech is normal and behavior is normal. Judgment and thought content normal. Cognition and memory are normal.   Results for orders placed or performed in visit on 07/27/14  TSH  Result Value Ref Range   TSH 1.809 0.350 - 4.500 uIU/mL      Assessment & Plan:   Problem List Items Addressed This Visit      Cardiovascular and Mediastinum   Migraine headache without aura - Primary    Lexapro is working well to control migraines. Pt is having sexual side effects. Discussed wait and see approach vs. Changing medication. Pt would like to continue with medication. Trial of gingko biloba to help with sexual side effects. Consider switching to BP medication to control migraines if not improving.          No orders of the defined types were placed in this encounter.      Follow up plan: Return if symptoms worsen or fail to improve.

## 2014-10-21 NOTE — Patient Instructions (Signed)

## 2014-10-21 NOTE — Assessment & Plan Note (Signed)
Lexapro is working well to control migraines. Pt is having sexual side effects. Discussed wait and see approach vs. Changing medication. Pt would like to continue with medication. Trial of gingko biloba to help with sexual side effects. Consider switching to BP medication to control migraines if not improving.

## 2014-10-26 ENCOUNTER — Ambulatory Visit (INDEPENDENT_AMBULATORY_CARE_PROVIDER_SITE_OTHER): Payer: 59 | Admitting: Family Medicine

## 2014-10-26 ENCOUNTER — Encounter: Payer: Self-pay | Admitting: Family Medicine

## 2014-10-26 VITALS — BP 110/74 | HR 65 | Temp 98.4°F | Resp 16 | Ht 67.0 in | Wt 172.6 lb

## 2014-10-26 DIAGNOSIS — Z0000 Encounter for general adult medical examination without abnormal findings: Secondary | ICD-10-CM

## 2014-10-26 DIAGNOSIS — M67431 Ganglion, right wrist: Secondary | ICD-10-CM | POA: Diagnosis not present

## 2014-10-26 NOTE — Patient Instructions (Signed)
Trial of halving dose of escitalopram for your migraines to see if that helps with side effects. If it doesn't maintain half dose for 4 days and stop medication.   Please don't hesitate to let us know if you migraines are getting worse. We can start you on prophylactic medication to prevent your headaches.

## 2014-10-26 NOTE — Progress Notes (Signed)
Subjective:    Patient ID: [name], female    DOB: 1985/10/19, 29 y.o.   MRN: [id number]  HPI: [name] is a 29 y.o. female presenting on 10/26/2014 for Annual Exam   HPI  Pt presents for annual physical today. She is UTD on GYN exam- sees OB-GYN for paps and pelvics- last done in October 2015. Overall feeling well. Current medical problems include migraines.  Exercise: Currently no formal exercise programs, has 3 children stays active. Formerly exercised 5 days per week, would like to increase her exercise.  Diet: Caffeine once per day to help to reduce headaches. She feels she has a problem with portion control. Doesn't eat breakfast or lunch. Wears sunscreen when outside.   Pt has also noted a growth on her R wrist.  It started during her pregnancy. Growth is located on the top of the R wrist, hard, non-tender. Pt reports it is interfering with her ability to do push-up. She would like to have it removed if possible.      Past Medical History  Diagnosis Date  . Rh negative state in antepartum period   . Abnormal Pap smear   . Migraine    History   Social History  . Marital Status: Married    Spouse Name: N/A  . Number of Children: N/A  . Years of Education: N/A   Occupational History  . Not on file.   Social History Main Topics  . Smoking status: Never Smoker   . Smokeless tobacco: Never Used  . Alcohol Use: No  . Drug Use: No  . Sexual Activity:    Partners: Male    Birth Control/ Protection: None   Other Topics Concern  . Not on file   Social History Narrative   Family History  Problem Relation Age of Onset  . Diabetes Maternal Grandmother   . Diabetes Maternal Grandfather   . Anesthesia problems Neg Hx   . Hypotension Neg Hx   . Malignant hyperthermia Neg Hx   . Pseudochol deficiency Neg Hx   . Depression Mother   . Alcohol abuse Father    Current Outpatient Prescriptions on File Prior to Visit  Medication Sig  . escitalopram  (LEXAPRO) 10 MG tablet Take 1 tablet (10 mg total) by mouth daily.  . SUMAtriptan (IMITREX) 50 MG tablet Take 1 tablet (50 mg total) by mouth as needed for migraine. May repeat in 2 hours if headache persists or recurs.   No current facility-administered medications on file prior to visit.    Review of Systems  Constitutional: Negative for fever and chills.  Respiratory: Negative for cough, chest tightness and wheezing.   Cardiovascular: Negative for chest pain and leg swelling.  Gastrointestinal: Negative for nausea, vomiting, abdominal pain, diarrhea and constipation.  Endocrine: Negative.  Negative for cold intolerance, heat intolerance, polydipsia, polyphagia and polyuria.  Genitourinary: Negative for dysuria and difficulty urinating.  Musculoskeletal: Positive for arthralgias (R wrist pain with push-ups due to growth. ).  Neurological: Positive for headaches (occasional migraines. ). Negative for dizziness, light-headedness and numbness.  Psychiatric/Behavioral: Negative.    Per HPI unless specifically indicated above     Objective:    BP 110/74 mmHg  Pulse 65  Temp(Src) 98.4 F (36.9 C) (Oral)  Resp 16  Ht  (1.702 m)  Wt 172 lb 9.6 oz (78.291 kg)  BMI 27.03 kg/m2  LMP 10/03/2014  Wt Readings from Last 3 Encounters:  10/26/14 172 lb 9.6 oz (78.291 kg)  10/21/14 172 lb 3.2 oz (78.109 kg)  09/23/14 171 lb (77.565 kg)    Physical Exam  Constitutional: She is oriented to person, place, and time. She appears well-developed and well-nourished.  HENT:  Head: Normocephalic and atraumatic.  Neck: Neck supple.  Cardiovascular: Normal rate, regular rhythm and normal heart sounds.  Exam reveals no gallop and no friction rub.   No murmur heard. Pulmonary/Chest: Effort normal and breath sounds normal. She has no wheezes. She exhibits no tenderness.  Abdominal: Soft. Normal appearance and bowel sounds are normal. She exhibits no distension and no mass. There is no tenderness.  There is no rebound and no guarding.  Musculoskeletal: Normal range of motion. She exhibits no edema or tenderness.  Lymphadenopathy:    She has no cervical adenopathy.  Neurological: She is alert and oriented to person, place, and time.  Skin: Skin is warm and dry.  R wrist nodule. Mobile, non-tender. Rubbery consistency.    Results for orders placed or performed in visit on 07/27/14  TSH  Result Value Ref Range   TSH 1.809 0.350 - 4.500 uIU/mL      Assessment & Plan:   Problem List Items Addressed This Visit    None    Visit Diagnoses    Preventative health care    -  Primary    CMP and TSH for baseline. Encouraged cardio and resistance for exercise. Health maintenance reviewed.    Relevant Orders    Lipid Profile    Comprehensive Metabolic Panel (CMET)    Ganglion cyst of wrist, right        Refer to ortho for evaluation for removal.     Relevant Orders    Ambulatory referral to Orthopedic Surgery       No orders of the defined types were placed in this encounter.      Follow up plan: Return in about 1 year (around 10/26/2015).

## 2014-11-09 ENCOUNTER — Telehealth: Payer: Self-pay | Admitting: Family Medicine

## 2014-11-09 MED ORDER — BREAST PUMP MISC: Status: DC

## 2014-11-09 NOTE — Telephone Encounter (Signed)
Script for Breast Pump printed.

## 2014-11-09 NOTE — Telephone Encounter (Signed)
Pt needs an rx to be written for a replacement breast pump. Pt will pick it up when ready. Please advise.

## 2014-12-07 ENCOUNTER — Encounter: Payer: Self-pay | Admitting: *Deleted

## 2015-07-15 ENCOUNTER — Other Ambulatory Visit: Payer: Self-pay | Admitting: Family Medicine

## 2015-07-15 DIAGNOSIS — G43009 Migraine without aura, not intractable, without status migrainosus: Secondary | ICD-10-CM

## 2015-07-16 MED ORDER — SUMATRIPTAN SUCCINATE 50 MG PO TABS: 50.0000 mg | ORAL_TABLET | ORAL | Status: DC | PRN

## 2015-08-25 ENCOUNTER — Ambulatory Visit (INDEPENDENT_AMBULATORY_CARE_PROVIDER_SITE_OTHER): Payer: Managed Care, Other (non HMO) | Admitting: Family Medicine

## 2015-08-25 ENCOUNTER — Other Ambulatory Visit: Payer: Self-pay | Admitting: Family Medicine

## 2015-08-25 VITALS — BP 112/72 | HR 73 | Temp 98.3°F | Resp 16 | Ht 67.0 in | Wt 202.0 lb

## 2015-08-25 DIAGNOSIS — Z01419 Encounter for gynecological examination (general) (routine) without abnormal findings: Secondary | ICD-10-CM

## 2015-08-25 DIAGNOSIS — Z124 Encounter for screening for malignant neoplasm of cervix: Secondary | ICD-10-CM | POA: Diagnosis not present

## 2015-08-25 DIAGNOSIS — F419 Anxiety disorder, unspecified: Secondary | ICD-10-CM

## 2015-08-25 DIAGNOSIS — N926 Irregular menstruation, unspecified: Secondary | ICD-10-CM | POA: Diagnosis not present

## 2015-08-25 LAB — HM PAP SMEAR: HM Pap smear: NEGATIVE

## 2015-08-25 MED ORDER — NORETHINDRONE ACET-ETHINYL EST 1-20 MG-MCG PO TABS: 1.0000 | ORAL_TABLET | Freq: Every day | ORAL | Status: DC

## 2015-08-25 MED ORDER — ESCITALOPRAM OXALATE 20 MG PO TABS: 20.0000 mg | ORAL_TABLET | Freq: Every day | ORAL | Status: DC

## 2015-08-25 NOTE — Assessment & Plan Note (Signed)
Increase lexapro to 20 mg daily.

## 2015-08-25 NOTE — Progress Notes (Signed)
Subjective:    Patient ID: [name], female    DOB: March 22, 1985, 30 y.o.   MRN: [id number]  HPI: [name] is a 30 y.o. female presenting on 08/25/2015 for Annual Exam   HPI  Patient presents for well woman exam. Having issues with periods being very irregular since her tubes were tied. Irregular. LMP 08/15/2015. No blood clots in periods. No fatigue dizziness. Finishes one period and has days of spotting. Cycles are 24-30 days between. No dysparunia. Does BSE at home. No lumps bumps or changes. Would like pap today.  Would like to go up to 20 on her lexapro for anxiety. Today. Doing well overall but thinks she needs a little more.   Past Medical History  Diagnosis Date  . Rh negative state in antepartum period   . Abnormal Pap smear   . Migraine    Social History   Social History  . Marital Status: Married    Spouse Name: N/A  . Number of Children: N/A  . Years of Education: N/A   Occupational History  . Not on file.   Social History Main Topics  . Smoking status: Never Smoker   . Smokeless tobacco: Never Used  . Alcohol Use: No  . Drug Use: No  . Sexual Activity:    Partners: Male    Birth Control/ Protection: None   Other Topics Concern  . Not on file   Social History Narrative   Family History  Problem Relation Age of Onset  . Diabetes Maternal Grandmother   . Diabetes Maternal Grandfather   . Anesthesia problems Neg Hx   . Hypotension Neg Hx   . Malignant hyperthermia Neg Hx   . Pseudochol deficiency Neg Hx   . Depression Mother   . Alcohol abuse Father    Current Outpatient Prescriptions on File Prior to Visit  Medication Sig  . Misc. Devices (BREAST PUMP) MISC Dispense one breast pump for patient  . SUMAtriptan (IMITREX) 50 MG tablet Take 1 tablet (50 mg total) by mouth as needed for migraine. May repeat in 2 hours if headache persists or recurs.   No current facility-administered medications on file prior to visit.     Review of  Systems  Constitutional: Negative for fever and chills.  HENT: Negative.   Respiratory: Negative for cough, chest tightness and wheezing.   Cardiovascular: Negative for chest pain and leg swelling.  Gastrointestinal: Negative for nausea, vomiting, abdominal pain, diarrhea and constipation.  Endocrine: Negative.  Negative for cold intolerance, heat intolerance, polydipsia, polyphagia and polyuria.  Genitourinary: Negative for dysuria and difficulty urinating.  Musculoskeletal: Negative.   Neurological: Negative for dizziness, light-headedness and numbness.  Psychiatric/Behavioral: Negative.    Per HPI unless specifically indicated above     Objective:    BP 112/72 mmHg  Pulse 73  Temp(Src) 98.3 F (36.8 C) (Oral)  Resp 16  Ht 5\' 7"  (1.702 m)  Wt 202 lb (91.627 kg)  BMI 31.63 kg/m2  LMP 08/15/2015  Wt Readings from Last 3 Encounters:  08/25/15 202 lb (91.627 kg)  10/26/14 172 lb 9.6 oz (78.291 kg)  10/21/14 172 lb 3.2 oz (78.109 kg)    Physical Exam  Constitutional: She is oriented to person, place, and time. She appears well-developed and well-nourished.  HENT:  Head: Normocephalic and atraumatic.  Neck: Normal range of motion. Neck supple. No thyromegaly present.  Cardiovascular: Normal rate and regular rhythm.  Exam reveals no gallop and no friction rub.   No  murmur heard. Pulmonary/Chest: Breath sounds normal. No respiratory distress. She has no wheezes.  Abdominal: Soft. Bowel sounds are normal. She exhibits no distension. There is no tenderness.  Genitourinary: Vagina normal and uterus normal. No breast swelling, tenderness or discharge. There is no rash or tenderness on the right labia. There is no rash, tenderness or lesion on the left labia. Uterus is not tender. Cervix exhibits no motion tenderness, no discharge and no friability. Right adnexum displays no mass, no tenderness and no fullness. Left adnexum displays no mass, no tenderness and no fullness. No tenderness  in the vagina.  Musculoskeletal: Normal range of motion. She exhibits no edema or tenderness.  Lymphadenopathy:    She has no cervical adenopathy.  Neurological: She is alert and oriented to person, place, and time.  Skin: Skin is warm and dry.  Psychiatric: She has a normal mood and affect. Her behavior is normal. Judgment normal.   Results for orders placed or performed in visit on 07/27/14  TSH  Result Value Ref Range   TSH 1.809 0.350 - 4.500 uIU/mL      Assessment & Plan:   Problem List Items Addressed This Visit      Other   Anxiety    Increase lexapro to  daily.       Relevant Medications   escitalopram (LEXAPRO) 20 MG tablet   Irregular menses    Start OCP's to help regulate cycle. No h/o blood clots, no migraine with aura, no family h/o clotting d/o.       Relevant Medications   norethindrone-ethinyl estradiol (MICROGESTIN,JUNEL,LOESTRIN) 1-20 MG-MCG tablet    Other Visit Diagnoses    Well woman exam with routine gynecological exam    -  Primary    Health maintenance reviewed. Check CMP and lipid today. Return 1 year.     Relevant Orders    Pap, ThinPrep rflx HPV    COMPLETE METABOLIC PANEL WITH GFR    Lipid Profile    Screening for cervical cancer        Relevant Orders    Pap, ThinPrep rflx HPV       Meds ordered this encounter  Medications  . DISCONTD: norethindrone-ethinyl estradiol (MICROGESTIN,JUNEL,LOESTRIN) 1-20 MG-MCG tablet    Sig: Take 1 tablet by mouth daily.    Dispense:  1 Package    Refill:  11    Order Specific Question:  Supervising Provider    Answer:  Janeann Forehand [phone]  . DISCONTD: escitalopram (LEXAPRO) 20 MG tablet    Sig: Take 1 tablet (20 mg total) by mouth daily.    Dispense:  30 tablet    Refill:  11    Order Specific Question:  Supervising Provider    Answer:  Janeann Forehand [phone]  . norethindrone-ethinyl estradiol (MICROGESTIN,JUNEL,LOESTRIN) 1-20 MG-MCG tablet    Sig: Take 1 tablet by mouth daily.      Dispense:  1 Package    Refill:  11    Order Specific Question:  Supervising Provider    Answer:  Janeann Forehand [phone]  . escitalopram (LEXAPRO) 20 MG tablet    Sig: Take 1 tablet (20 mg total) by mouth daily.    Dispense:  30 tablet    Refill:  11    Order Specific Question:  Supervising Provider    Answer:  Janeann Forehand [phone]      Follow up plan: No Follow-up on file.

## 2015-08-25 NOTE — Patient Instructions (Signed)
Health Maintenance, Female Adopting a healthy lifestyle and getting preventive care can go a long way to promote health and wellness. Talk with your health care provider about what schedule of regular examinations is right for you. This is a good chance for you to check in with your provider about disease prevention and staying healthy. In between checkups, there are plenty of things you can do on your own. Experts have done a lot of research about which lifestyle changes and preventive measures are most likely to keep you healthy. Ask your health care provider for more information. WEIGHT AND DIET  Eat a healthy diet  Be sure to include plenty of vegetables, fruits, low-fat dairy products, and lean protein.  Do not eat a lot of foods high in solid fats, added sugars, or salt.  Get regular exercise. This is one of the most important things you can do for your health.  Most adults should exercise for at least 150 minutes each week. The exercise should increase your heart rate and make you sweat (moderate-intensity exercise).  Most adults should also do strengthening exercises at least twice a week. This is in addition to the moderate-intensity exercise.  Maintain a healthy weight  Body mass index (BMI) is a measurement that can be used to identify possible weight problems. It estimates body fat based on height and weight. Your health care provider can help determine your BMI and help you achieve or maintain a healthy weight.  For females 20 years of age and older:   A BMI below 18.5 is considered underweight.  A BMI of 18.5 to 24.9 is normal.  A BMI of 25 to 29.9 is considered overweight.  A BMI of 30 and above is considered obese.  Watch levels of cholesterol and blood lipids  You should start having your blood tested for lipids and cholesterol at 29 years of age, then have this test every 5 years.  You may need to have your cholesterol levels checked more often if:  Your lipid  or cholesterol levels are high.  You are older than 29 years of age.  You are at high risk for heart disease.  CANCER SCREENING   Lung Cancer  Lung cancer screening is recommended for adults 55-80 years old who are at high risk for lung cancer because of a history of smoking.  A yearly low-dose CT scan of the lungs is recommended for people who:  Currently smoke.  Have quit within the past 15 years.  Have at least a 30-pack-year history of smoking. A pack year is smoking an average of one pack of cigarettes a day for 1 year.  Yearly screening should continue until it has been 15 years since you quit.  Yearly screening should stop if you develop a health problem that would prevent you from having lung cancer treatment.  Breast Cancer  Practice breast self-awareness. This means understanding how your breasts normally appear and feel.  It also means doing regular breast self-exams. Let your health care provider know about any changes, no matter how small.  If you are in your 20s or 30s, you should have a clinical breast exam (CBE) by a health care provider every 1-3 years as part of a regular health exam.  If you are 40 or older, have a CBE every year. Also consider having a breast X-ray (mammogram) every year.  If you have a family history of breast cancer, talk to your health care provider about genetic screening.  If you   are at high risk for breast cancer, talk to your health care provider about having an MRI and a mammogram every year.  Breast cancer gene (BRCA) assessment is recommended for women who have family members with BRCA-related cancers. BRCA-related cancers include:  Breast.  Ovarian.  Tubal.  Peritoneal cancers.  Results of the assessment will determine the need for genetic counseling and BRCA1 and BRCA2 testing. Cervical Cancer Your health care provider may recommend that you be screened regularly for cancer of the pelvic organs (ovaries, uterus, and  vagina). This screening involves a pelvic examination, including checking for microscopic changes to the surface of your cervix (Pap test). You may be encouraged to have this screening done every 3 years, beginning at age 21.  For women ages 30-65, health care providers may recommend pelvic exams and Pap testing every 3 years, or they may recommend the Pap and pelvic exam, combined with testing for human papilloma virus (HPV), every 5 years. Some types of HPV increase your risk of cervical cancer. Testing for HPV may also be done on women of any age with unclear Pap test results.  Other health care providers may not recommend any screening for nonpregnant women who are considered low risk for pelvic cancer and who do not have symptoms. Ask your health care provider if a screening pelvic exam is right for you.  If you have had past treatment for cervical cancer or a condition that could lead to cancer, you need Pap tests and screening for cancer for at least 20 years after your treatment. If Pap tests have been discontinued, your risk factors (such as having a new sexual partner) need to be reassessed to determine if screening should resume. Some women have medical problems that increase the chance of getting cervical cancer. In these cases, your health care provider may recommend more frequent screening and Pap tests. Colorectal Cancer  This type of cancer can be detected and often prevented.  Routine colorectal cancer screening usually begins at 29 years of age and continues through 29 years of age.  Your health care provider may recommend screening at an earlier age if you have risk factors for colon cancer.  Your health care provider may also recommend using home test kits to check for hidden blood in the stool.  A small camera at the end of a tube can be used to examine your colon directly (sigmoidoscopy or colonoscopy). This is done to check for the earliest forms of colorectal  cancer.  Routine screening usually begins at age 50.  Direct examination of the colon should be repeated every 5-10 years through 29 years of age. However, you may need to be screened more often if early forms of precancerous polyps or small growths are found. Skin Cancer  Check your skin from head to toe regularly.  Tell your health care provider about any new moles or changes in moles, especially if there is a change in a mole's shape or color.  Also tell your health care provider if you have a mole that is larger than the size of a pencil eraser.  Always use sunscreen. Apply sunscreen liberally and repeatedly throughout the day.  Protect yourself by wearing long sleeves, pants, a wide-brimmed hat, and sunglasses whenever you are outside. HEART DISEASE, DIABETES, AND HIGH BLOOD PRESSURE   High blood pressure causes heart disease and increases the risk of stroke. High blood pressure is more likely to develop in:  People who have blood pressure in the high end   of the normal range (130-139/85-89 mm Hg).  People who are overweight or obese.  People who are African American.  If you are 38-23 years of age, have your blood pressure checked every 3-5 years. If you are 61 years of age or older, have your blood pressure checked every year. You should have your blood pressure measured twice--once when you are at a hospital or clinic, and once when you are not at a hospital or clinic. Record the average of the two measurements. To check your blood pressure when you are not at a hospital or clinic, you can use:  An automated blood pressure machine at a pharmacy.  A home blood pressure monitor.  If you are between 45 years and 39 years old, ask your health care provider if you should take aspirin to prevent strokes.  Have regular diabetes screenings. This involves taking a blood sample to check your fasting blood sugar level.  If you are at a normal weight and have a low risk for diabetes,  have this test once every three years after 29 years of age.  If you are overweight and have a high risk for diabetes, consider being tested at a younger age or more often. PREVENTING INFECTION  Hepatitis B  If you have a higher risk for hepatitis B, you should be screened for this virus. You are considered at high risk for hepatitis B if:  You were born in a country where hepatitis B is common. Ask your health care provider which countries are considered high risk.  Your parents were born in a high-risk country, and you have not been immunized against hepatitis B (hepatitis B vaccine).  You have HIV or AIDS.  You use needles to inject street drugs.  You live with someone who has hepatitis B.  You have had sex with someone who has hepatitis B.  You get hemodialysis treatment.  You take certain medicines for conditions, including cancer, organ transplantation, and autoimmune conditions. Hepatitis C  Blood testing is recommended for:  Everyone born from [birth year] through 1964.  Anyone with known risk factors for hepatitis C. Sexually transmitted infections (STIs)  You should be screened for sexually transmitted infections (STIs) including gonorrhea and chlamydia if:  You are sexually active and are younger than 29 years of age.  You are older than 29 years of age and your health care provider tells you that you are at risk for this type of infection.  Your sexual activity has changed since you were last screened and you are at an increased risk for chlamydia or gonorrhea. Ask your health care provider if you are at risk.  If you do not have HIV, but are at risk, it may be recommended that you take a prescription medicine daily to prevent HIV infection. This is called pre-exposure prophylaxis (PrEP). You are considered at risk if:  You are sexually active and do not regularly use condoms or know the HIV status of your partner(s).  You take drugs by injection.  You are sexually  active with a partner who has HIV. Talk with your health care provider about whether you are at high risk of being infected with HIV. If you choose to begin PrEP, you should first be tested for HIV. You should then be tested every 3 months for as long as you are taking PrEP.  PREGNANCY   If you are premenopausal and you may become pregnant, ask your health care provider about preconception counseling.  If you may  become pregnant, take 400 to 800 micrograms (mcg) of folic acid every day.  If you want to prevent pregnancy, talk to your health care provider about birth control (contraception). OSTEOPOROSIS AND MENOPAUSE   Osteoporosis is a disease in which the bones lose minerals and strength with aging. This can result in serious bone fractures. Your risk for osteoporosis can be identified using a bone density scan.  If you are 61 years of age or older, or if you are at risk for osteoporosis and fractures, ask your health care provider if you should be screened.  Ask your health care provider whether you should take a calcium or vitamin D supplement to lower your risk for osteoporosis.  Menopause may have certain physical symptoms and risks.  Hormone replacement therapy may reduce some of these symptoms and risks. Talk to your health care provider about whether hormone replacement therapy is right for you.  HOME CARE INSTRUCTIONS   Schedule regular health, dental, and eye exams.  Stay current with your immunizations.   Do not use any tobacco products including cigarettes, chewing tobacco, or electronic cigarettes.  If you are pregnant, do not drink alcohol.  If you are breastfeeding, limit how much and how often you drink alcohol.  Limit alcohol intake to no more than 1 drink per day for nonpregnant women. One drink equals 12 ounces of beer, 5 ounces of wine, or 1 ounces of hard liquor.  Do not use street drugs.  Do not share needles.  Ask your health care provider for help if  you need support or information about quitting drugs.  Tell your health care provider if you often feel depressed.  Tell your health care provider if you have ever been abused or do not feel safe at home.   This information is not intended to replace advice given to you by your health care provider. Make sure you discuss any questions you have with your health care provider.   Document Released: 09/19/2010 Document Revised: 03/27/2014 Document Reviewed: 02/05/2013 Elsevier Interactive Patient Education [employer].

## 2015-08-25 NOTE — Addendum Note (Signed)
Addended by: Elvina MattesPATEL, NISHABEN D on: 08/25/2015 02:17 PM   Modules accepted: Orders, SmartSet

## 2015-08-25 NOTE — Assessment & Plan Note (Signed)
Start OCP's to help regulate cycle. No h/o blood clots, no migraine with aura, no family h/o clotting d/o.

## 2015-08-26 LAB — LIPID PANEL
Cholesterol: 162 mg/dL (ref 125–200)
HDL: 46 mg/dL (ref >=46)
LDL Cholesterol: 80 mg/dL (ref <130)
TRIGLYCERIDES: 180 mg/dL — AB (ref <150)
Total CHOL/HDL Ratio: 3.5 Ratio (ref <=5.0)
VLDL: 36 mg/dL — AB (ref <30)

## 2015-08-26 LAB — COMPLETE METABOLIC PANEL WITH GFR
ALBUMIN: 4.4 g/dL (ref 3.6–5.1)
ALK PHOS: 117 U/L — AB (ref 33–115)
ALT: 11 U/L (ref 6–29)
AST: 13 U/L (ref 10–30)
BILIRUBIN TOTAL: 0.6 mg/dL (ref 0.2–1.2)
BUN: 10 mg/dL (ref 7–25)
CALCIUM: 9.5 mg/dL (ref 8.6–10.2)
CHLORIDE: 100 mmol/L (ref 98–110)
CO2: 27 mmol/L (ref 20–31)
CREATININE: 0.78 mg/dL (ref 0.50–1.10)
GFR, Est Non African American: >89 mL/min (ref >=60)
Glucose, Bld: 104 mg/dL — ABNORMAL HIGH (ref 65–99)
Potassium: 4.2 mmol/L (ref 3.5–5.3)
Sodium: 138 mmol/L (ref 135–146)
TOTAL PROTEIN: 7.3 g/dL (ref 6.1–8.1)

## 2015-08-27 ENCOUNTER — Encounter: Payer: Self-pay | Admitting: Family Medicine

## 2015-08-27 ENCOUNTER — Telehealth: Payer: Self-pay | Admitting: Family Medicine

## 2015-08-27 LAB — PAP,SUREPATH RFLX HPV

## 2015-08-27 NOTE — Telephone Encounter (Signed)
Please let her know her pap smear was normal. We can screen again in 3 years. Thanks! AK

## 2015-08-27 NOTE — Telephone Encounter (Signed)
Patient aware.[place] 

## 2015-10-12 ENCOUNTER — Telehealth: Payer: Self-pay | Admitting: Family Medicine

## 2015-10-12 NOTE — Telephone Encounter (Signed)
Pt is on birth control pills but it's not working for her.  She would like to see about getting an IUD.  Her call back number is [phone]

## 2015-10-12 NOTE — Telephone Encounter (Signed)
Called patient and let her know she should contact gyn for appt to discuss iud. She will call office back and let us know appt date so that we can fax notes.

## 2015-10-26 ENCOUNTER — Encounter: Payer: Self-pay | Admitting: Family Medicine

## 2015-10-26 ENCOUNTER — Ambulatory Visit (INDEPENDENT_AMBULATORY_CARE_PROVIDER_SITE_OTHER): Payer: Managed Care, Other (non HMO) | Admitting: Family Medicine

## 2015-10-26 ENCOUNTER — Telehealth: Payer: Self-pay | Admitting: Family Medicine

## 2015-10-26 VITALS — BP 115/80 | HR 74 | Wt 206.0 lb

## 2015-10-26 DIAGNOSIS — Z01812 Encounter for preprocedural laboratory examination: Secondary | ICD-10-CM | POA: Diagnosis not present

## 2015-10-26 DIAGNOSIS — N926 Irregular menstruation, unspecified: Secondary | ICD-10-CM | POA: Diagnosis not present

## 2015-10-26 DIAGNOSIS — F419 Anxiety disorder, unspecified: Secondary | ICD-10-CM

## 2015-10-26 DIAGNOSIS — Z30011 Encounter for initial prescription of contraceptive pills: Secondary | ICD-10-CM

## 2015-10-26 LAB — POCT URINE PREGNANCY: Preg Test, Ur: NEGATIVE

## 2015-10-26 MED ORDER — BUPROPION HCL ER (XL) 150 MG PO TB24: 150.0000 mg | ORAL_TABLET | Freq: Every day | ORAL | 11 refills | Status: DC

## 2015-10-26 MED ORDER — NORGESTIMATE-ETH ESTRADIOL 0.25-35 MG-MCG PO TABS: 1.0000 | ORAL_TABLET | Freq: Every day | ORAL | 11 refills | Status: DC

## 2015-10-26 MED ORDER — LEVONORGESTREL 18.6 MCG/DAY IU IUD: INTRAUTERINE_SYSTEM | Freq: Once | INTRAUTERINE | Status: DC

## 2015-10-26 NOTE — Progress Notes (Signed)
   Subjective:    Patient ID: [name] is a 30 y.o. female presenting with Contraception  on 10/26/2015  HPI: Here to discuss irregular cycles. Since she stopped nursing. Began on Lexapro and put on 30 lbs. Some cycles are q 30 days and some are q 24 days. Pt was placed on OC's and bled and it was Loestrin. Her PCP stated she could get an IUD to improve the regularity of her cycles. She has had an IUD in the past and continued to have cycles and months of bleeding. She is s/p BTL.   Review of Systems  Constitutional: Negative for chills and fever.  Respiratory: Negative for shortness of breath.   Cardiovascular: Negative for chest pain.  Gastrointestinal: Negative for abdominal pain, nausea and vomiting.  Genitourinary: Negative for dysuria.  Skin: Negative for rash.      Objective:    BP 115/80   Pulse 74   Wt 206 lb (93.4 kg)   LMP 10/16/2015   Breastfeeding? No   BMI 32.26 kg/m  Physical Exam  Constitutional: She is oriented to person, place, and time. She appears well-developed and well-nourished. No distress.  HENT:  Head: Normocephalic and atraumatic.  Eyes: No scleral icterus.  Neck: Neck supple.  Cardiovascular: Normal rate.   Pulmonary/Chest: Effort normal.  Abdominal: Soft.  Neurological: She is alert and oriented to person, place, and time.  Skin: Skin is warm and dry.  Psychiatric: She has a normal mood and affect.        Assessment & Plan:   Problem List Items Addressed This Visit      Unprioritized   Irregular menses - Primary   Relevant Medications   norgestimate-ethinyl estradiol (ORTHO-CYCLEN,SPRINTEC,PREVIFEM) 0.25-35 MG-MCG tablet   Other Relevant Orders   POCT urine pregnancy (Completed)    Other Visit Diagnoses   None.     Total face-to-face time with patient: 15 minutes. Over 50% of encounter was spent on counseling and coordination of care. Return if symptoms worsen or fail to improve.  Zephyr Ridley S 10/26/2015 4:58 PM

## 2015-10-26 NOTE — Telephone Encounter (Signed)
Pt went to gyn to birth control and they suggested changing her antidepressant to wellbutrin.  She asked if she needed to come in since she was just her for her physical.  Her call back number is [phone]

## 2015-10-26 NOTE — Telephone Encounter (Signed)
Called pt to discuss changing her medications. Will try Wellbutrin once daily. Dose instructions given on how to taper off of lexapro and start Wellbutrin. Will follow-up with patient in 4-6 weeks.

## 2015-11-19 ENCOUNTER — Encounter: Payer: Self-pay | Admitting: Family Medicine

## 2015-11-19 ENCOUNTER — Ambulatory Visit (INDEPENDENT_AMBULATORY_CARE_PROVIDER_SITE_OTHER): Payer: Managed Care, Other (non HMO) | Admitting: Family Medicine

## 2015-11-19 VITALS — BP 117/68 | HR 71 | Temp 98.5°F | Resp 16 | Ht 67.0 in | Wt 205.6 lb

## 2015-11-19 DIAGNOSIS — G43009 Migraine without aura, not intractable, without status migrainosus: Secondary | ICD-10-CM | POA: Diagnosis not present

## 2015-11-19 DIAGNOSIS — R635 Abnormal weight gain: Secondary | ICD-10-CM | POA: Diagnosis not present

## 2015-11-19 DIAGNOSIS — F419 Anxiety disorder, unspecified: Secondary | ICD-10-CM

## 2015-11-19 MED ORDER — FLUOXETINE HCL 20 MG PO TABS
20.0000 mg | ORAL_TABLET | Freq: Every day | ORAL | 3 refills | Status: DC
Start: 2015-11-19 — End: 2015-11-19

## 2015-11-19 MED ORDER — FLUOXETINE HCL 20 MG PO CAPS: 20.0000 mg | ORAL_CAPSULE | Freq: Every day | ORAL | 11 refills | Status: DC

## 2015-11-19 NOTE — Assessment & Plan Note (Signed)
Change to prozac for better anxiety coverage and weight neutral option. Taper off wellbutrin. Recheck 4 weeks.

## 2015-11-19 NOTE — Patient Instructions (Signed)
Let's try Prozac to see if that is more weight neutral. Take 20 mg once daily to help control anxiety. You can taper off Wellbutrin by breaking tablet in 1/2  For 2-4 days to taper off.   We will check some labs to determine the cause of weight gain.

## 2015-11-19 NOTE — Assessment & Plan Note (Signed)
Controlled and stable on current medication 

## 2015-11-19 NOTE — Progress Notes (Signed)
Subjective:    Patient ID: [name], female    DOB: [birth year], 30 y.o.   MRN: [id number]  HPI: [name] is a 30 y.o. female presenting on 11/19/2015 for Anxiety   HPI  Pt presents for follow-up of anxiety. Switched to well-butrin due to weight issues. Has not noticed she lost weight. Feels hungry all the time. Does not feel the anxiety is doing much better. Feels more irritated faster- cannot get over the control issues.  She noticed the abnormal weight gain starting last November. Has gained 30lbs in the last year. Does not always exercise regularly but has 3 kids. She does feel hungry all the time. She thought the lexapro was the source of her weight gain. The only other change in her life was she stopped nursing her 29 year old.   Past Medical History:  Diagnosis Date  . Abnormal Pap smear   . Migraine   . Rh negative state in antepartum period     Current Outpatient Prescriptions on File Prior to Visit  Medication Sig  . Misc. Devices (BREAST PUMP) MISC Dispense one breast pump for patient  . norgestimate-ethinyl estradiol (ORTHO-CYCLEN,SPRINTEC,PREVIFEM) 0.25-35 MG-MCG tablet Take 1 tablet by mouth daily.  . SUMAtriptan (IMITREX) 50 MG tablet Take 1 tablet (50 mg total) by mouth as needed for migraine. May repeat in 2 hours if headache persists or recurs.   No current facility-administered medications on file prior to visit.     Review of Systems  Constitutional: Positive for unexpected weight change. Negative for chills and fever.  HENT: Negative.   Respiratory: Negative for cough, chest tightness and wheezing.   Cardiovascular: Negative for chest pain and leg swelling.  Gastrointestinal: Negative for abdominal pain, constipation, diarrhea, nausea and vomiting.  Endocrine: Negative.  Negative for cold intolerance, heat intolerance, polydipsia, polyphagia and polyuria.  Genitourinary: Negative for difficulty urinating and dysuria.  Musculoskeletal: Negative.     Neurological: Negative for dizziness, light-headedness and numbness.  Psychiatric/Behavioral: Negative for decreased concentration, dysphoric mood, sleep disturbance and suicidal ideas. The patient is nervous/anxious.    Per HPI unless specifically indicated above     Objective:    BP 117/68 (BP Location: Right Arm, Patient Position: Sitting, Cuff Size: Normal)   Pulse 71   Temp 98.5 F (36.9 C) (Oral)   Resp 16   Ht 5\' 7"  (1.702 m)   Wt 205 lb 9.6 oz (93.3 kg)   LMP 10/16/2015   BMI 32.20 kg/m   Wt Readings from Last 3 Encounters:  11/19/15 205 lb 9.6 oz (93.3 kg)  10/26/15 206 lb (93.4 kg)  08/25/15 202 lb (91.6 kg)    Physical Exam  Constitutional: She is oriented to person, place, and time. She appears well-developed and well-nourished.  HENT:  Head: Normocephalic and atraumatic.  Neck: Neck supple.  Cardiovascular: Normal rate, regular rhythm and normal heart sounds.  Exam reveals no gallop and no friction rub.   No murmur heard. Pulmonary/Chest: Effort normal and breath sounds normal. She has no wheezes. She exhibits no tenderness.  Abdominal: Soft. Normal appearance and bowel sounds are normal. She exhibits no distension and no mass. There is no tenderness. There is no rebound and no guarding.  Musculoskeletal: Normal range of motion. She exhibits no edema or tenderness.  Lymphadenopathy:    She has no cervical adenopathy.  Neurological: She is alert and oriented to person, place, and time.  Skin: Skin is warm and dry.   Results for orders placed  or performed in visit on 10/26/15  POCT urine pregnancy  Result Value Ref Range   Preg Test, Ur Negative Negative      Assessment & Plan:   Problem List Items Addressed This Visit      Cardiovascular and Mediastinum   Migraine headache without aura    Controlled and stable on current medication.       Relevant Medications   FLUoxetine (PROZAC) 20 MG capsule     Other   Anxiety - Primary    Change to prozac  for better anxiety coverage and weight neutral option. Taper off wellbutrin. Recheck 4 weeks.       Relevant Medications   FLUoxetine (PROZAC) 20 MG capsule   Other Relevant Orders   Vitamin D (25 hydroxy)   B12    Other Visit Diagnoses    Abnormal weight gain       Check TSH, B12, vitamin D for causes of weight gain.    Relevant Orders   TSH      Meds ordered this encounter  Medications  . DISCONTD: FLUoxetine (PROZAC) 20 MG tablet    Sig: Take 1 tablet (20 mg total) by mouth daily.    Dispense:  30 tablet    Refill:  3    Order Specific Question:   Supervising Provider    Answer:   Janeann ForehandHAWKINS JR, JAMES H [phone][[id number]]  . FLUoxetine (PROZAC) 20 MG capsule    Sig: Take 1 capsule (20 mg total) by mouth daily.    Dispense:  30 capsule    Refill:  11    Order Specific Question:   Supervising Provider    Answer:   Janeann ForehandHAWKINS JR, JAMES H [phone][[id number]]      Follow up plan: Return in about 4 weeks (around 12/17/2015) for Anxiety/ migraines.

## 2015-11-20 LAB — VITAMIN D 25 HYDROXY (VIT D DEFICIENCY, FRACTURES): Vit D, 25-Hydroxy: 33 ng/mL (ref 30–100)

## 2015-11-20 LAB — TSH: TSH: 2.54 mIU/L

## 2015-11-20 LAB — VITAMIN B12: Vitamin B-12: 258 pg/mL (ref 200–1100)

## 2015-12-15 ENCOUNTER — Encounter: Payer: Self-pay | Admitting: Family Medicine

## 2015-12-15 ENCOUNTER — Ambulatory Visit (INDEPENDENT_AMBULATORY_CARE_PROVIDER_SITE_OTHER): Payer: Managed Care, Other (non HMO) | Admitting: Family Medicine

## 2015-12-15 VITALS — BP 122/73 | HR 65 | Temp 98.6°F | Resp 16 | Ht 67.0 in | Wt 203.0 lb

## 2015-12-15 DIAGNOSIS — F419 Anxiety disorder, unspecified: Secondary | ICD-10-CM | POA: Diagnosis not present

## 2015-12-15 DIAGNOSIS — Z23 Encounter for immunization: Secondary | ICD-10-CM | POA: Diagnosis not present

## 2015-12-15 DIAGNOSIS — E668 Other obesity: Secondary | ICD-10-CM

## 2015-12-15 DIAGNOSIS — IMO0002 Reserved for concepts with insufficient information to code with codable children: Secondary | ICD-10-CM | POA: Insufficient documentation

## 2015-12-15 NOTE — Assessment & Plan Note (Signed)
Pt feels well on prozac aside from side effects. Trial of taking in the PM and taking vitamin D to help with fatigue. Sexual sides likely linked to fatigue- may go away with time or adding gingko supplement. Recheck 1-2 weeks and consider changing medications or adding buspar if not improved.

## 2015-12-15 NOTE — Patient Instructions (Signed)
Try taking prozac at night to help with drowsiness. Also try taking 2000IU vitamin D daily in the morning to help with energy.   You can add a gingko supplement for sexual side effects.  Please let me know if these changes have not made an appreciable effect in 1-2 weeks.   For weight loss- try eliminating soda.

## 2015-12-15 NOTE — Progress Notes (Signed)
Subjective:    Patient ID: [name], female    DOB: 1985-04-30, 30 y.o.   MRN: [id number]  HPI: [name] is a 30 y.o. female presenting on 12/15/2015 for Follow-up (prozac---has some side effect )   HPI  Pt presents for follow-up of change to prozac. Anxiety is doing well but lots of side effects. Is having sexual side effects- low sex drive- started last week. Feeling tired- started when her sex drive took a drive.  Is eating smaller portions. Trying to stop drinking soda. Drinks coke- more than 1.   Past Medical History:  Diagnosis Date  . Abnormal Pap smear   . Migraine   . Rh negative state in antepartum period     Current Outpatient Prescriptions on File Prior to Visit  Medication Sig  . FLUoxetine (PROZAC) 20 MG capsule Take 1 capsule (20 mg total) by mouth daily.  . Misc. Devices (BREAST PUMP) MISC Dispense one breast pump for patient  . norgestimate-ethinyl estradiol (ORTHO-CYCLEN,SPRINTEC,PREVIFEM) 0.25-35 MG-MCG tablet Take 1 tablet by mouth daily.  . SUMAtriptan (IMITREX) 50 MG tablet Take 1 tablet (50 mg total) by mouth as needed for migraine. May repeat in 2 hours if headache persists or recurs.   No current facility-administered medications on file prior to visit.     Review of Systems  Constitutional: Positive for fatigue. Negative for chills and fever.  HENT: Negative.   Respiratory: Negative for cough, chest tightness and wheezing.   Cardiovascular: Negative for chest pain and leg swelling.  Gastrointestinal: Negative for abdominal pain, constipation, diarrhea, nausea and vomiting.  Endocrine: Negative.  Negative for cold intolerance, heat intolerance, polydipsia, polyphagia and polyuria.  Genitourinary: Negative for difficulty urinating and dysuria.  Musculoskeletal: Negative.   Neurological: Negative for dizziness, light-headedness and numbness.  Psychiatric/Behavioral: Negative.  Negative for dysphoric mood, hallucinations and suicidal  ideas. The patient is not nervous/anxious.    Per HPI unless specifically indicated above     Objective:    BP 122/73   Pulse 65   Temp 98.6 F (37 C) (Oral)   Resp 16   Ht 5\' 7"  (1.702 m)   Wt 203 lb (92.1 kg)   BMI 31.79 kg/m   Wt Readings from Last 3 Encounters:  12/15/15 203 lb (92.1 kg)  11/19/15 205 lb 9.6 oz (93.3 kg)  10/26/15 206 lb (93.4 kg)    Physical Exam  Constitutional: She is oriented to person, place, and time. She appears well-developed and well-nourished.  HENT:  Head: Normocephalic and atraumatic.  Neck: Neck supple.  Cardiovascular: Normal rate, regular rhythm and normal heart sounds.  Exam reveals no gallop and no friction rub.   No murmur heard. Pulmonary/Chest: Effort normal and breath sounds normal. She has no wheezes. She exhibits no tenderness.  Abdominal: Soft. Normal appearance and bowel sounds are normal. She exhibits no distension and no mass. There is no tenderness. There is no rebound and no guarding.  Musculoskeletal: Normal range of motion. She exhibits no edema or tenderness.  Lymphadenopathy:    She has no cervical adenopathy.  Neurological: She is alert and oriented to person, place, and time.  Skin: Skin is warm and dry.  Psychiatric: She has a normal mood and affect. Her behavior is normal. Judgment and thought content normal.   Results for orders placed or performed in visit on 11/19/15  TSH  Result Value Ref Range   TSH 2.54 mIU/L  Vitamin D (25 hydroxy)  Result Value Ref Range  Vit D, 25-Hydroxy 33 30 - 100 ng/mL  B12  Result Value Ref Range   Vitamin B-12 258 200 - 1,100 pg/mL      Assessment & Plan:   Problem List Items Addressed This Visit      Other   Anxiety    Pt feels well on prozac aside from side effects. Trial of taking in the PM and taking vitamin D to help with fatigue. Sexual sides likely linked to fatigue- may go away with time or adding gingko supplement. Recheck 1-2 weeks and consider changing  medications or adding buspar if not improved.       Adult BMI 30+    Weight gain over the past year. Pt is drinking >1 soda per day.  Discussed diet and lifestyle changes. Smaller portions. Eliminating sodas. Have discussed possibility to weight loss medication if lifestyle interventions do not have desired effect.        Other Visit Diagnoses    Need for influenza vaccination    -  Primary   Relevant Orders   Flu Vaccine QUAD 36+ mos PF IM (Fluarix & Fluzone Quad PF) (Completed)      No orders of the defined types were placed in this encounter.     Follow up plan: Return in about 2 weeks (around 12/29/2015), or if symptoms worsen or fail to improve.

## 2015-12-15 NOTE — Assessment & Plan Note (Signed)
Weight gain over the past year. Pt is drinking >1 soda per day.  Discussed diet and lifestyle changes. Smaller portions. Eliminating sodas. Have discussed possibility to weight loss medication if lifestyle interventions do not have desired effect.

## 2015-12-29 ENCOUNTER — Ambulatory Visit (INDEPENDENT_AMBULATORY_CARE_PROVIDER_SITE_OTHER): Payer: Managed Care, Other (non HMO) | Admitting: Family Medicine

## 2015-12-29 ENCOUNTER — Other Ambulatory Visit: Payer: Self-pay | Admitting: Family Medicine

## 2015-12-29 ENCOUNTER — Encounter: Payer: Self-pay | Admitting: Family Medicine

## 2015-12-29 VITALS — BP 119/78 | HR 72 | Temp 98.1°F | Resp 16 | Ht 67.0 in | Wt 203.0 lb

## 2015-12-29 DIAGNOSIS — N3000 Acute cystitis without hematuria: Secondary | ICD-10-CM

## 2015-12-29 MED ORDER — NITROFURANTOIN MONOHYD MACRO 100 MG PO CAPS: 100.0000 mg | ORAL_CAPSULE | Freq: Two times a day (BID) | ORAL | 0 refills | Status: DC

## 2015-12-29 NOTE — Patient Instructions (Addendum)

## 2015-12-29 NOTE — Progress Notes (Signed)
Subjective:    Patient ID: [name], female    DOB: 11-03-1985, 30 y.o.   MRN: [id number]  HPI: [name] is a 30 y.o. female presenting on 12/29/2015 for Urinary Tract Infection (onset 2 days OTC AZO taken)   HPI  Pt presents for possible UTI. Symptoms started on Monday afternoon. Urgency and frequency. Suprapubic pain. Used a bathbomb last week- is concerned it could have provoked. No vaginal discharge. No fevers. No recent sexual intercourse prior to onset of symptoms. Drinking lots of fluids. Took AZO standard at home x 2-3 doses. Mild relief.   Past Medical History:  Diagnosis Date  . Abnormal Pap smear   . Migraine   . Rh negative state in antepartum period     Current Outpatient Prescriptions on File Prior to Visit  Medication Sig  . FLUoxetine (PROZAC) 20 MG capsule Take 1 capsule (20 mg total) by mouth daily.  . Misc. Devices (BREAST PUMP) MISC Dispense one breast pump for patient  . norgestimate-ethinyl estradiol (ORTHO-CYCLEN,SPRINTEC,PREVIFEM) 0.25-35 MG-MCG tablet Take 1 tablet by mouth daily.  . SUMAtriptan (IMITREX) 50 MG tablet Take 1 tablet (50 mg total) by mouth as needed for migraine. May repeat in 2 hours if headache persists or recurs.   No current facility-administered medications on file prior to visit.     Review of Systems  Constitutional: Negative for chills and fever.  Respiratory: Negative for chest tightness, shortness of breath and wheezing.   Cardiovascular: Negative for chest pain, palpitations and leg swelling.  Gastrointestinal: Negative for nausea and vomiting.  Genitourinary: Positive for dysuria, flank pain, frequency and urgency. Negative for decreased urine volume, difficulty urinating, dyspareunia, hematuria, vaginal bleeding, vaginal discharge and vaginal pain.   Per HPI unless specifically indicated above     Objective:    BP 119/78   Pulse 72   Temp 98.1 F (36.7 C) (Oral)   Resp 16   Ht 5\' 7"  (1.702 m)   Wt 203  lb (92.1 kg)   BMI 31.79 kg/m   Wt Readings from Last 3 Encounters:  12/29/15 203 lb (92.1 kg)  12/15/15 203 lb (92.1 kg)  11/19/15 205 lb 9.6 oz (93.3 kg)    Physical Exam  Constitutional: She is oriented to person, place, and time. She appears well-developed and well-nourished. No distress.  HENT:  Head: Normocephalic and atraumatic.  Cardiovascular: Normal rate and regular rhythm.  Exam reveals no gallop and no friction rub.   No murmur heard. Pulmonary/Chest: Effort normal and breath sounds normal. No respiratory distress.  Abdominal: Soft. Normal appearance and bowel sounds are normal. She exhibits no fluid wave, no abdominal bruit, no ascites and no mass. There is no hepatosplenomegaly. There is tenderness in the suprapubic area. There is no CVA tenderness.  Neurological: She is alert and oriented to person, place, and time. No cranial nerve deficit. Coordination normal.  Skin: She is not diaphoretic.  Psychiatric: Her behavior is normal.   Results for orders placed or performed in visit on 11/19/15  TSH  Result Value Ref Range   TSH 2.54 mIU/L  Vitamin D (25 hydroxy)  Result Value Ref Range   Vit D, 25-Hydroxy 33 30 - 100 ng/mL  B12  Result Value Ref Range   Vitamin B-12 258 200 - 1,100 pg/mL      Assessment & Plan:   Problem List Items Addressed This Visit    None    Visit Diagnoses    Acute cystitis without hematuria    -  Primary   Treat empirically for UTI based on symptoms. Unable to get UA today.  Will send out for microscopy and culture. Alarm symptoms reviewed. Return if not improving   Relevant Medications   nitrofurantoin, macrocrystal-monohydrate, (MACROBID) 100 MG capsule   Other Relevant Orders   Urinalysis, microscopic only   Urine culture      Meds ordered this encounter  Medications  . nitrofurantoin, macrocrystal-monohydrate, (MACROBID) 100 MG capsule    Sig: Take 1 capsule (100 mg total) by mouth 2 (two) times daily. For 5 days.    Dispense:   10 capsule    Refill:  0    Order Specific Question:   Supervising Provider    Answer:   Janeann ForehandHAWKINS JR, JAMES H [[id number]]      Follow up plan: Return if symptoms worsen or fail to improve.

## 2015-12-30 LAB — URINALYSIS, MICROSCOPIC ONLY
Casts: NONE SEEN [LPF]
Yeast: NONE SEEN [HPF]

## 2015-12-31 LAB — URINE CULTURE

## 2016-03-22 ENCOUNTER — Telehealth: Payer: Self-pay | Admitting: Family Medicine

## 2016-03-22 ENCOUNTER — Encounter: Payer: Self-pay | Admitting: *Deleted

## 2016-03-22 DIAGNOSIS — F419 Anxiety disorder, unspecified: Secondary | ICD-10-CM

## 2016-03-22 MED ORDER — FLUOXETINE HCL 20 MG PO CAPS: 20.0000 mg | ORAL_CAPSULE | Freq: Every day | ORAL | 11 refills | Status: DC

## 2016-03-22 NOTE — Telephone Encounter (Signed)
Reviewed chart, last seen for anxiety by Amy Krebs FNP 11/2015, she was doing well on Fluoxetine 20mg  daily, had tapered off Wellbutrin, also previously on Buspar and Lexapro.  Sent refill Fluoxetine 20mg  daily, #30, +11 refills.  Please notify patient that refill was sent and she should follow-up as advised by Amy Krebs, anytime within next 1-3 months for Anxiety follow-up.  Linda PilarAlexander Cindra Austad, DO [facility] 03/22/2016, 1:27 PM

## 2016-03-22 NOTE — Telephone Encounter (Signed)
Pt. Called requesting a refill on prozac. Pt call back [phone]

## 2016-04-27 ENCOUNTER — Other Ambulatory Visit: Payer: Self-pay | Admitting: Family Medicine

## 2016-04-27 DIAGNOSIS — G43009 Migraine without aura, not intractable, without status migrainosus: Secondary | ICD-10-CM

## 2016-04-27 MED ORDER — SUMATRIPTAN SUCCINATE 50 MG PO TABS: 50.0000 mg | ORAL_TABLET | ORAL | 1 refills | Status: DC | PRN

## 2016-11-30 ENCOUNTER — Other Ambulatory Visit: Payer: Self-pay | Admitting: Family Medicine

## 2016-11-30 DIAGNOSIS — G43909 Migraine, unspecified, not intractable, without status migrainosus: Secondary | ICD-10-CM

## 2016-11-30 MED ORDER — SUMATRIPTAN SUCCINATE 50 MG PO TABS: 50.0000 mg | ORAL_TABLET | ORAL | 0 refills | Status: DC | PRN

## 2018-04-03 ENCOUNTER — Other Ambulatory Visit: Payer: Self-pay

## 2018-04-03 ENCOUNTER — Encounter: Payer: Self-pay | Admitting: Emergency Medicine

## 2018-04-03 ENCOUNTER — Emergency Department
Admission: EM | Admit: 2018-04-03 | Discharge: 2018-04-03 | Disposition: A | Discharge status: Home / Self Care | Payer: BLUE CROSS/BLUE SHIELD | Attending: Emergency Medicine | Admitting: Emergency Medicine

## 2018-04-03 ENCOUNTER — Emergency Department: Payer: BLUE CROSS/BLUE SHIELD

## 2018-04-03 DIAGNOSIS — Z79899 Other long term (current) drug therapy: Secondary | ICD-10-CM | POA: Diagnosis not present

## 2018-04-03 DIAGNOSIS — M545 Low back pain, unspecified: Secondary | ICD-10-CM

## 2018-04-03 LAB — POCT PREGNANCY, URINE: Preg Test, Ur: NEGATIVE

## 2018-04-03 MED ORDER — OXYCODONE-ACETAMINOPHEN 5-325 MG PO TABS
1.0000 | ORAL_TABLET | Freq: Once | ORAL | Status: AC
  Administered 2018-04-03: 1 via ORAL
  Filled 2018-04-03: qty 1

## 2018-04-03 MED ORDER — METHOCARBAMOL 500 MG PO TABS: ORAL_TABLET | ORAL | 0 refills | Status: DC

## 2018-04-03 MED ORDER — NAPROXEN 500 MG PO TABS: 500.0000 mg | ORAL_TABLET | Freq: Two times a day (BID) | ORAL | 0 refills | Status: DC

## 2018-04-03 MED ORDER — METHOCARBAMOL 500 MG PO TABS
1000.0000 mg | ORAL_TABLET | Freq: Once | ORAL | Status: AC
  Administered 2018-04-03: 1000 mg via ORAL
  Filled 2018-04-03: qty 2

## 2018-04-03 MED ORDER — KETOROLAC TROMETHAMINE 30 MG/ML IJ SOLN
30.0000 mg | Freq: Once | INTRAMUSCULAR | Status: AC
  Administered 2018-04-03: 30 mg via INTRAMUSCULAR
  Filled 2018-04-03: qty 1

## 2018-04-03 MED ORDER — OXYCODONE-ACETAMINOPHEN 5-325 MG PO TABS: 1.0000 | ORAL_TABLET | Freq: Four times a day (QID) | ORAL | 0 refills | Status: DC | PRN

## 2018-04-03 NOTE — ED Notes (Signed)
First Nurse Note: Patient to ED via [facility] [address] states she bent over to pick something up and felt a shooting pain in her back and now having difficulty walking.  Denies previous back injury.

## 2018-04-03 NOTE — Discharge Instructions (Addendum)
Follow-up with Dr. Ernest Pine who is the orthopedist on call if any continued problems.  You may also follow-up with [facility] acute care if any urgent problems.  Continue take methocarbamol 500 mg.  You may take 1/2 tablet to 1 tablet every 6 hours for muscle spasms.  Naproxen 500 mg twice daily with food for inflammation.  Oxycodone is 1 every 6 hours as needed for moderate pain.  Use ice or heat to your back as needed for discomfort.  Refrain from working out in the gym for at least 1 week.  No lifting above shoulder level and no weight more than 5 pounds for 1 week.  Advance slowly and if there is any pain then decrease your activity.

## 2018-04-03 NOTE — ED Triage Notes (Signed)
PT arrived with complaints of lower back pain. Pt states the pain started this morning when she bent over. Pt states she had a sudden onset of burning pain in her lower back. Pt states when she is not moving the pain is minimal but increases immensely with movement. No loss of bowel/bladder.

## 2018-04-03 NOTE — ED Provider Notes (Signed)
[facility] Emergency Department Provider Note  ___________________________________________   First MD Initiated Contact with Patient 04/03/18 [phone]     (approximate)  I have reviewed the triage vital signs and the nursing notes.   HISTORY  Chief Complaint Back Pain   HPI [name] is a 33 y.o. female presents to the ED with complaint of low back pain.  Patient states that she bent over this morning and began having back pain.  She states that it was a sudden onset of a burning sensation in her lower back.  She denies any previous back problems.  She denies any paresthesias into her lower extremities.  There is been no saddle anesthesias or incontinence of bowel or bladder.  Patient states that if she is not moving there is minimal pain.  Pain is increased with any movement.  Eating still patient rates her pain as a 3/10.   Past Medical History:  Diagnosis Date  . Abnormal Pap smear   . Migraine   . Rh negative state in antepartum period     Patient Active Problem List   Diagnosis Date Noted  . Adult BMI 30+ 12/15/2015  . Irregular menses 07/27/2014  . Migraine headache without aura 04/18/2011  . Anxiety 04/18/2011    Past Surgical History:  Procedure Laterality Date  . CESAREAN SECTION  10/07/2011   Procedure: CESAREAN SECTION;  Surgeon: Willodean Rosenthalarolyn Harraway-Smith, MD;  Location: [facility] ORS;  Service: Obstetrics;  Laterality: N/A;  . CESAREAN SECTION WITH BILATERAL TUBAL LIGATION N/A 12/19/2012   Procedure: CESAREAN SECTION WITH BILATERAL TUBAL LIGATION;  Surgeon: Catalina AntiguaPeggy Constant, MD;  Location: [facility] ORS;  Service: Obstetrics;  Laterality: N/A;  . DILATION AND CURETTAGE OF UTERUS    . TONSILLECTOMY     33 yrs old    Prior to Admission medications   Medication Sig Start Date End Date Taking? Authorizing Provider  FLUoxetine (PROZAC) 20 MG capsule Take 1 capsule (20 mg total) by mouth daily. 03/22/16   Karamalegos, Netta NeatAlexander J, DO  methocarbamol  (ROBAXIN) 500 MG tablet 1/2 to 1 tablet every 6 hours as needed for muscle spasms 04/03/18   Tommi RumpsSummers, Rhonda L, PA-C  Misc. Devices (BREAST PUMP) MISC Dispense one breast pump for patient 11/09/14   Reva BoresPratt, Tanya S, MD  naproxen (NAPROSYN) 500 MG tablet Take 1 tablet (500 mg total) by mouth 2 (two) times daily with a meal. 04/03/18   Tommi RumpsSummers, Rhonda L, PA-C  nitrofurantoin, macrocrystal-monohydrate, (MACROBID) 100 MG capsule Take 1 capsule (100 mg total) by mouth 2 (two) times daily. For 5 days. 12/29/15   Loura PardonKrebs, Amy Lauren, NP  norgestimate-ethinyl estradiol (ORTHO-CYCLEN,SPRINTEC,PREVIFEM) 0.25-35 MG-MCG tablet Take 1 tablet by mouth daily. 10/26/15   Reva BoresPratt, Tanya S, MD  oxyCODONE-acetaminophen (PERCOCET) 5-325 MG tablet Take 1 tablet by mouth every 6 (six) hours as needed for severe pain. 04/03/18   Tommi RumpsSummers, Rhonda L, PA-C  SUMAtriptan (IMITREX) 50 MG tablet Take 1 tablet (50 mg total) by mouth every 2 (two) hours as needed for migraine. May repeat in 2 hours if headache persists or recurs. 11/30/16   Smitty CordsKaramalegos, Alexander J, DO    Allergies Patient has no known allergies.  Family History  Problem Relation Age of Onset  . Diabetes Maternal Grandmother   . Diabetes Maternal Grandfather   . Depression Mother   . Alcohol abuse Father   . Anesthesia problems Neg Hx   . Hypotension Neg Hx   . Malignant hyperthermia Neg Hx   . Pseudochol deficiency  Neg Hx     Social History Social History   Tobacco Use  . Smoking status: Never Smoker  . Smokeless tobacco: Never Used  Substance Use Topics  . Alcohol use: No  . Drug use: No    Review of Systems Constitutional: No fever/chills Cardiovascular: Denies chest pain. Respiratory: Denies shortness of breath. Gastrointestinal: No abdominal pain.  No nausea, no vomiting.  Genitourinary: Negative for dysuria. Musculoskeletal: Positive for low back pain. Skin: Negative for rash. Neurological: Negative for  focal weakness or  numbness. ___________________________________________   PHYSICAL EXAM:  VITAL SIGNS: ED Triage Vitals  Enc Vitals Group     BP 04/03/18 0804 114/62     Pulse Rate 04/03/18 0804 65     Resp 04/03/18 0804 15     Temp 04/03/18 0804 97.6 F (36.4 C)     Temp Source 04/03/18 0804 Oral     SpO2 04/03/18 0804 100 %     Weight 04/03/18 0805 182 lb (82.6 kg)     Height 04/03/18 0805 5\' 7"  (1.702 m)     Head Circumference --      Peak Flow --      Pain Score 04/03/18 0810 3     Pain Loc --      Pain Edu? --      Excl. in GC? --    Constitutional: Alert and oriented. Well appearing and in no acute distress. Eyes: Conjunctivae are normal.  Head: Atraumatic. Nose: No congestion/rhinnorhea. Neck: No stridor.   Cardiovascular: Normal rate, regular rhythm. Grossly normal heart sounds.  Good peripheral circulation. Respiratory: Normal respiratory effort.  No retractions. Lungs CTAB. Gastrointestinal: Soft and nontender. No distention. No CVA tenderness. Musculoskeletal: On examination of the back there is no gross deformity however there is tenderness on palpation of the lower lumbar paravertebral muscles bilaterally.  No point tenderness on palpation of the thoracic or lumbar spine and no step offs are noted.  Range of motion is moderately restricted secondary to increased pain.  Good muscle strength bilaterally.  Gait was not tested secondary to patient's pain. Neurologic:  Normal speech and language. No gross focal neurologic deficits are appreciated.  Skin:  Skin is warm, dry and intact. No rash noted. Psychiatric: Mood and affect are normal. Speech and behavior are normal.  ____________________________________________   LABS (all labs ordered are listed, but only abnormal results are displayed)  Labs Reviewed  POC URINE PREG, ED  POCT PREGNANCY, URINE   RADIOLOGY   Official radiology report(s): Dg Lumbar Spine 2-3 Views  Result Date: 04/03/2018 CLINICAL DATA:  Acute low back  pain this morning. EXAM: LUMBAR SPINE - 2-3 VIEW COMPARISON:  None. FINDINGS: There is no evidence of lumbar spine fracture. Alignment is normal. Intervertebral disc spaces are maintained. There is acute angulation at the sacrococcygeal junction which is probably developmental. No visible fracture. IMPRESSION: Negative. Electronically Signed   By: Francene Boyers M.D.   On: 04/03/2018 11:00    ____________________________________________   PROCEDURES  Procedure(s) performed: None  Procedures  Critical Care performed: No  ____________________________________________   INITIAL IMPRESSION / ASSESSMENT AND PLAN / ED COURSE  As part of my medical decision making, I reviewed the following data within the electronic MEDICAL RECORD NUMBER Notes from prior ED visits and [place] Controlled Substance Database  Patient presents to the ED with sudden onset of low back pain when she bent over this morning.  Patient denies any previous problems with her back.  She states that as long  as she is sitting still she has no pain but movement increases her pain.  She denies any urinary symptoms.  Physical exam is suspicious for more muscle skeletal strain.  Patient was given Percocet, Toradol 30 mg IM and methocarbamol 1000 mg p.o.  X-ray findings was discussed with both she and her husband.  Patient states that pain has decreased and is tolerable.  Patient was discharged with medications and instructions to follow-up with her PCP if any continued problems or return to the emergency department if any severe worsening of her symptoms.  ____________________________________________   FINAL CLINICAL IMPRESSION(S) / ED DIAGNOSES  Final diagnoses:  Acute midline low back pain without sciatica     ED Discharge Orders         Ordered    methocarbamol (ROBAXIN) 500 MG tablet     04/03/18 1118    oxyCODONE-acetaminophen (PERCOCET) 5-325 MG tablet  Every 6 hours PRN     04/03/18 1118    naproxen (NAPROSYN) 500 MG tablet   2 times daily with meals     04/03/18 1118           Note:  This document was prepared using Dragon voice recognition software and may include unintentional dictation errors.    Tommi RumpsSummers, Rhonda L, PA-C 04/03/18 1559    Sharman CheekStafford, Phillip, MD 04/04/18 [phone]

## 2018-04-03 NOTE — ED Notes (Signed)
Pt tried to give urine sample and stated she could not. Pt stated she had tubes tied and just finished cycle one week ago.

## 2018-05-15 ENCOUNTER — Encounter: Payer: Self-pay | Admitting: Physician Assistant

## 2018-05-15 ENCOUNTER — Ambulatory Visit (INDEPENDENT_AMBULATORY_CARE_PROVIDER_SITE_OTHER): Payer: BLUE CROSS/BLUE SHIELD | Admitting: Physician Assistant

## 2018-05-15 VITALS — BP 118/75 | HR 83 | Temp 98.2°F | Ht 67.0 in | Wt 177.6 lb

## 2018-05-15 DIAGNOSIS — Z0000 Encounter for general adult medical examination without abnormal findings: Secondary | ICD-10-CM | POA: Diagnosis not present

## 2018-05-15 NOTE — Progress Notes (Signed)
Patient: [name], Female    DOB: 1985/03/25, 33 y.o.   MRN: [id number] Visit Date: 05/16/2018  Today's Provider: Trey Sailors, PA-C   Chief Complaint  Patient presents with  . New Patient (Initial Visit)   Subjective:    Annual physical exam/ New Patient Appoinment [name] is a 33 y.o. female who presents today for health maintenance and complete physical. She feels well. She reports exercising 5 days a week. She reports she is sleeping well. She is married to her husband of ten years. Lives in [place], has three childrens ages 2, 73, 57. 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   She has a history of migraines but these have resolved.   After her third child, she underwent tubal ligation. Since then, her periods have been somewhat irregular, with her cycle length varying from 20-40 days. She had been on OCP prior to help regulate her cycle but was not consistent in taking them. She is due for a PAP smear. Last Pap: 08/25/2015   -----------------------------------------------------------------   Review of Systems  Constitutional: Negative.   HENT: Negative.   Eyes: Negative.   Respiratory: Negative.   Cardiovascular: Negative.   Gastrointestinal: Negative.   Endocrine: Negative.   Genitourinary: Negative.   Musculoskeletal: Negative.   Skin: Negative.   Allergic/Immunologic: Negative.   Neurological: Positive for headaches.  Hematological: Negative.   Psychiatric/Behavioral: Negative.     Social History She  reports that she has never smoked. She has never used smokeless tobacco. She reports that she does not drink alcohol or use drugs. Social History   Socioeconomic History  . Marital status: Married    Spouse name: Not on file  . Number of children: Not on file  . Years of education: Not on file  . Highest education level: Not on file  Occupational History  . Not on file  Social Needs  . Financial resource strain: Not on  file  . Food insecurity:    Worry: Not on file    Inability: Not on file  . Transportation needs:    Medical: Not on file    Non-medical: Not on file  Tobacco Use  . Smoking status: Never Smoker  . Smokeless tobacco: Never Used  Substance and Sexual Activity  . Alcohol use: No  . Drug use: No  . Sexual activity: Yes    Partners: Male    Birth control/protection: None  Lifestyle  . Physical activity:    Days per week: Not on file    Minutes per session: Not on file  . Stress: Not on file  Relationships  . Social connections:    Talks on phone: Not on file    Gets together: Not on file    Attends religious service: Not on file    Active member of club or organization: Not on file    Attends meetings of clubs or organizations: Not on file    Relationship status: Not on file  Other Topics Concern  . Not on file  Social History Narrative  . Not on file    Patient Active Problem List   Diagnosis Date Noted  . Adult BMI 30+ 12/15/2015  . Irregular menses 07/27/2014  . Migraine headache without aura 04/18/2011  . Anxiety 04/18/2011    Past Surgical History:  Procedure Laterality Date  . CESAREAN SECTION  10/07/2011   Procedure: CESAREAN SECTION;  Surgeon: Willodean Rosenthal, MD;  Location: [facility]  ORS;  Service: Obstetrics;  Laterality: N/A;  . CESAREAN SECTION WITH BILATERAL TUBAL LIGATION N/A 12/19/2012   Procedure: CESAREAN SECTION WITH BILATERAL TUBAL LIGATION;  Surgeon: Catalina Antigua, MD;  Location: [facility] ORS;  Service: Obstetrics;  Laterality: N/A;  . DILATION AND CURETTAGE OF UTERUS    . TONSILLECTOMY     33 yrs old    Family History  Family Status  Relation Name Status  . MGM  Alive  . MGF  Alive  . Mother  Alive  . Father  Alive  . Neg Hx  (Not Specified)   Her family history includes Alcohol abuse in her father; Anxiety disorder in her mother; COPD in her father; Depression in her father and mother; Diabetes in her maternal grandfather and maternal  grandmother.     No Known Allergies  Previous Medications   FLUOXETINE (PROZAC) 20 MG CAPSULE    Take 1 capsule (20 mg total) by mouth daily.   METHOCARBAMOL (ROBAXIN) 500 MG TABLET    1/2 to 1 tablet every 6 hours as needed for muscle spasms   MISC. DEVICES (BREAST PUMP) MISC    Dispense one breast pump for patient   NAPROXEN (NAPROSYN) 500 MG TABLET    Take 1 tablet (500 mg total) by mouth 2 (two) times daily with a meal.   NITROFURANTOIN, MACROCRYSTAL-MONOHYDRATE, (MACROBID) 100 MG CAPSULE    Take 1 capsule (100 mg total) by mouth 2 (two) times daily. For 5 days.   NORGESTIMATE-ETHINYL ESTRADIOL (ORTHO-CYCLEN,SPRINTEC,PREVIFEM) 0.25-35 MG-MCG TABLET    Take 1 tablet by mouth daily.   OXYCODONE-ACETAMINOPHEN (PERCOCET) 5-325 MG TABLET    Take 1 tablet by mouth every 6 (six) hours as needed for severe pain.   SUMATRIPTAN (IMITREX) 50 MG TABLET    Take 1 tablet (50 mg total) by mouth every 2 (two) hours as needed for migraine. May repeat in 2 hours if headache persists or recurs.    Patient Care Team: Maryella Shivers as PCP - General (Physician Assistant)      Objective:   Vitals: BP 118/75 (BP Location: Left Arm, Patient Position: Sitting, Cuff Size: Normal)   Pulse 83   Temp 98.2 F (36.8 C) (Oral)   Ht 5\' 7"  (1.702 m)   Wt 177 lb 9.6 oz (80.6 kg)   LMP 04/24/2018 (Exact Date)   SpO2 96%   BMI 27.82 kg/m    Physical Exam Constitutional:      Appearance: Normal appearance. She is normal weight.  HENT:     Right Ear: Tympanic membrane and ear canal normal.     Left Ear: Tympanic membrane and ear canal normal.     Mouth/Throat:     Mouth: Mucous membranes are moist.     Pharynx: Oropharynx is clear.  Cardiovascular:     Rate and Rhythm: Normal rate and regular rhythm.     Pulses: Normal pulses.     Heart sounds: Normal heart sounds.  Pulmonary:     Effort: Pulmonary effort is normal.     Breath sounds: Normal breath sounds.  Abdominal:     General: Abdomen is  flat.     Palpations: Abdomen is soft.  Skin:    General: Skin is warm and dry.  Neurological:     Mental Status: She is oriented to person, place, and time. Mental status is at baseline.  Psychiatric:        Mood and Affect: Mood normal.        Behavior: Behavior normal.  Depression Screen PHQ 2/9 Scores 05/15/2018  PHQ - 2 Score 0  PHQ- 9 Score 1      Assessment & Plan:     Routine Health Maintenance and Physical Exam  Exercise Activities and Dietary recommendations Goals   None     Immunization History  Administered Date(s) Administered  . Influenza,inj,Quad PF,6+ Mos 12/15/2015  . Rho (D) Immune Globulin 03/22/2011, 07/13/2011, 09/24/2012, 12/20/2012  . Tdap 07/13/2011, 09/24/2012    Health Maintenance  Topic Date Due  . INFLUENZA VACCINE  10/18/2017  . PAP SMEAR-Modifier  08/25/2018  . TETANUS/TDAP  09/25/2022  . HIV Screening  Completed     Discussed health benefits of physical activity, and encouraged her to engage in regular exercise appropriate for her age and condition.    1. Annual physical exam  Labs today. She will get an IUD and PAP in this clinic to help regulate her cycle.   - CBC with Differential/Platelet - Comprehensive metabolic panel - Lipid panel - TSH  The entirety of the information documented in the History of Present Illness, Review of Systems and Physical Exam were personally obtained by me. Portions of this information were initially documented by [facility], CMA and reviewed by me for thoroughness and accuracy.   Return if symptoms worsen or fail to improve.     --------------------------------------------------------------------

## 2018-05-16 LAB — CBC WITH DIFFERENTIAL/PLATELET
Basophils Absolute: 0 10*3/uL (ref 0.0–0.2)
Basos: 0 %
EOS (ABSOLUTE): 0.2 10*3/uL (ref 0.0–0.4)
Eos: 3 %
Hematocrit: 39.6 % (ref 34.0–46.6)
Hemoglobin: 13.6 g/dL (ref 11.1–15.9)
Immature Grans (Abs): 0 10*3/uL (ref 0.0–0.1)
Immature Granulocytes: 0 %
Lymphocytes Absolute: 2.4 10*3/uL (ref 0.7–3.1)
Lymphs: 28 %
MCH: 29.2 pg (ref 26.6–33.0)
MCHC: 34.3 g/dL (ref 31.5–35.7)
MCV: 85 fL (ref 79–97)
Monocytes Absolute: 0.6 10*3/uL (ref 0.1–0.9)
Monocytes: 7 %
Neutrophils Absolute: 5.2 10*3/uL (ref 1.4–7.0)
Neutrophils: 62 %
Platelets: 274 10*3/uL (ref 150–450)
RBC: 4.65 x10E6/uL (ref 3.77–5.28)
RDW: 13.4 % (ref 11.7–15.4)
WBC: 8.4 10*3/uL (ref 3.4–10.8)

## 2018-05-16 LAB — TSH: TSH: 2.21 u[IU]/mL (ref 0.450–4.500)

## 2018-05-16 LAB — COMPREHENSIVE METABOLIC PANEL
ALT: 10 IU/L (ref 0–32)
AST: 10 IU/L (ref 0–40)
Albumin/Globulin Ratio: 1.9 (ref 1.2–2.2)
Albumin: 5 g/dL — ABNORMAL HIGH (ref 3.8–4.8)
Alkaline Phosphatase: 95 IU/L (ref 39–117)
BUN/Creatinine Ratio: 22 (ref 9–23)
BUN: 17 mg/dL (ref 6–20)
Bilirubin Total: 0.4 mg/dL (ref 0.0–1.2)
CO2: 20 mmol/L (ref 20–29)
Calcium: 9.8 mg/dL (ref 8.7–10.2)
Chloride: 101 mmol/L (ref 96–106)
Creatinine, Ser: 0.78 mg/dL (ref 0.57–1.00)
GFR calc Af Amer: 117 mL/min/{1.73_m2} (ref >59)
GFR calc non Af Amer: 102 mL/min/{1.73_m2} (ref >59)
Globulin, Total: 2.6 g/dL (ref 1.5–4.5)
Glucose: 85 mg/dL (ref 65–99)
Potassium: 4.5 mmol/L (ref 3.5–5.2)
Sodium: 137 mmol/L (ref 134–144)
Total Protein: 7.6 g/dL (ref 6.0–8.5)

## 2018-05-16 LAB — LIPID PANEL
Chol/HDL Ratio: 2.4 ratio (ref 0.0–4.4)
Cholesterol, Total: 128 mg/dL (ref 100–199)
HDL: 53 mg/dL (ref >39)
LDL Calculated: 62 mg/dL (ref 0–99)
Triglycerides: 67 mg/dL (ref 0–149)
VLDL Cholesterol Cal: 13 mg/dL (ref 5–40)

## 2018-05-16 NOTE — Patient Instructions (Signed)

## 2018-05-17 ENCOUNTER — Telehealth: Payer: Self-pay

## 2018-05-17 NOTE — Telephone Encounter (Signed)
Patient has viewed results on mychart 05/17/2018 @10 :01 am.

## 2018-05-17 NOTE — Telephone Encounter (Signed)
-----   Message from Trey Sailors, [place] sent at 05/17/2018  9:54 AM EST ----- labwork all normal.

## 2018-06-03 ENCOUNTER — Encounter: Payer: Self-pay | Admitting: Family Medicine

## 2018-06-03 ENCOUNTER — Other Ambulatory Visit (HOSPITAL_COMMUNITY)
Admission: RE | Admit: 2018-06-03 | Discharge: 2018-06-03 | Disposition: A | Discharge status: Home / Self Care | Payer: BLUE CROSS/BLUE SHIELD | Source: Ambulatory Visit | Attending: Family Medicine | Admitting: Family Medicine

## 2018-06-03 ENCOUNTER — Other Ambulatory Visit: Payer: Self-pay

## 2018-06-03 ENCOUNTER — Ambulatory Visit: Payer: BLUE CROSS/BLUE SHIELD | Admitting: Family Medicine

## 2018-06-03 VITALS — BP 131/87 | HR 64 | Temp 97.9°F | Wt 186.6 lb

## 2018-06-03 DIAGNOSIS — Z124 Encounter for screening for malignant neoplasm of cervix: Secondary | ICD-10-CM | POA: Diagnosis not present

## 2018-06-03 DIAGNOSIS — N926 Irregular menstruation, unspecified: Secondary | ICD-10-CM

## 2018-06-03 DIAGNOSIS — Z30430 Encounter for insertion of intrauterine contraceptive device: Secondary | ICD-10-CM | POA: Diagnosis not present

## 2018-06-03 LAB — POCT URINE PREGNANCY: PREG TEST UR: NEGATIVE

## 2018-06-03 MED ORDER — LEVONORGESTREL 20 MCG/24HR IU IUD
INTRAUTERINE_SYSTEM | Freq: Once | INTRAUTERINE | Status: AC
  Administered 2018-06-03: 11:00:00 via INTRAUTERINE

## 2018-06-03 NOTE — Patient Instructions (Signed)

## 2018-06-03 NOTE — Progress Notes (Signed)
IUD Insertion Procedure Note  Pre-operative Diagnosis: irregular menses  Post-operative Diagnosis: same  Indications: irregular menses  Procedure Details  Urine pregnancy test was done and result was negative.  The risks (including infection, bleeding, pain, and uterine perforation) and benefits of the procedure were explained to the patient and Written informed consent was obtained.    Cervix cleansed with Betadine. Uterus sounded to 7 cm. IUD inserted without difficulty. String visible and trimmed. Patient tolerated procedure well.  IUD Information: Mirena, Lot # [id number], Expiration date 04/2020.  Condition: Stable  Complications: None  Plan:  The patient was advised to call for any fever or for prolonged or severe pain or bleeding. She was advised to use OTC analgesics as needed for mild to moderate pain.    Erasmo Downer, MD, MPH [facility] 06/03/2018 11:02 AM

## 2018-06-03 NOTE — Progress Notes (Signed)
Patient: [name] Female    DOB: [birth year]   33 y.o.   MRN: [id number] Visit Date: 06/03/2018  Today's Provider: Shirlee Latch, MD   Chief Complaint  Patient presents with  . IUD insertion   Subjective:     HPI  Patient presents today for cervical cancer screening (last pap smear NIL in 08/2015) and IUD insertion.  She had an IUD prior to pregnancy with her youngest child. She is G3P3.  She is s/p tubal ligation, but her menses are irregular.   No Known Allergies   Current Outpatient Medications:  .  FLUoxetine (PROZAC) 20 MG capsule, Take 1 capsule (20 mg total) by mouth daily. (Patient not taking: Reported on 05/15/2018), Disp: 30 capsule, Rfl: 11 .  methocarbamol (ROBAXIN) 500 MG tablet, 1/2 to 1 tablet every 6 hours as needed for muscle spasms (Patient not taking: Reported on 05/15/2018), Disp: 15 tablet, Rfl: 0 .  Misc. Devices (BREAST PUMP) MISC, Dispense one breast pump for patient (Patient not taking: Reported on 05/15/2018), Disp: 1 each, Rfl: 0 .  naproxen (NAPROSYN) 500 MG tablet, Take 1 tablet (500 mg total) by mouth 2 (two) times daily with a meal. (Patient not taking: Reported on 05/15/2018), Disp: 20 tablet, Rfl: 0 .  nitrofurantoin, macrocrystal-monohydrate, (MACROBID) 100 MG capsule, Take 1 capsule (100 mg total) by mouth 2 (two) times daily. For 5 days. (Patient not taking: Reported on 05/15/2018), Disp: 10 capsule, Rfl: 0 .  norgestimate-ethinyl estradiol (ORTHO-CYCLEN,SPRINTEC,PREVIFEM) 0.25-35 MG-MCG tablet, Take 1 tablet by mouth daily. (Patient not taking: Reported on 05/15/2018), Disp: 1 Package, Rfl: 11 .  oxyCODONE-acetaminophen (PERCOCET) 5-325 MG tablet, Take 1 tablet by mouth every 6 (six) hours as needed for severe pain. (Patient not taking: Reported on 05/15/2018), Disp: 10 tablet, Rfl: 0 .  SUMAtriptan (IMITREX) 50 MG tablet, Take 1 tablet (50 mg total) by mouth every 2 (two) hours as needed for migraine. May repeat in 2 hours if headache  persists or recurs. (Patient not taking: Reported on 05/15/2018), Disp: 10 tablet, Rfl: 0  Review of Systems  Constitutional: Negative.   Respiratory: Negative.   Cardiovascular: Negative.   Genitourinary: Negative.   Musculoskeletal: Negative.     Social History   Tobacco Use  . Smoking status: Never Smoker  . Smokeless tobacco: Never Used  Substance Use Topics  . Alcohol use: No      Objective:   BP 131/87 (BP Location: Right Arm, Patient Position: Sitting, Cuff Size: Normal)   Pulse 64   Temp 97.9 F (36.6 C) (Oral)   Wt 186 lb 9.6 oz (84.6 kg)   SpO2 99%   BMI 29.23 kg/m  Vitals:   06/03/18 1041  BP: 131/87  Pulse: 64  Temp: 97.9 F (36.6 C)  TempSrc: Oral  SpO2: 99%  Weight: 186 lb 9.6 oz (84.6 kg)     Physical Exam Vitals signs reviewed.  Constitutional:      General: She is not in acute distress.    Appearance: Normal appearance. She is not diaphoretic.  HENT:     Head: Normocephalic and atraumatic.  Eyes:     General: No scleral icterus.    Conjunctiva/sclera: Conjunctivae normal.  Cardiovascular:     Rate and Rhythm: Normal rate and regular rhythm.  Pulmonary:     Effort: Pulmonary effort is normal. No respiratory distress.  Abdominal:     General: There is no distension.     Palpations: Abdomen is soft.  Tenderness: There is no abdominal tenderness.  Genitourinary:    Comments: GYN:  External genitalia within normal limits.  Vaginal mucosa pink, moist, normal rugae.  Nonfriable cervix without lesions, no discharge or bleeding noted on speculum exam.  Some bleeding after pap smear collected.  Bimanual exam revealed normal, nongravid uterus.  No cervical motion tenderness. No adnexal masses bilaterally.   Skin:    General: Skin is warm and dry.  Neurological:     Mental Status: She is alert and oriented to person, place, and time. Mental status is at baseline.  Psychiatric:        Mood and Affect: Mood normal.        Behavior: Behavior  normal.    Results for orders placed or performed in visit on 06/03/18  POCT urine pregnancy  Result Value Ref Range   Preg Test, Ur Negative Negative        Assessment & Plan   1. Encounter for IUD insertion - desires Mirena for irregular menses - s/p tubal ligation - see procedure note - POCT urine pregnancy - levonorgestrel (MIRENA) 20 MCG/24HR IUD  2. Screening for cervical cancer - pap smear collected today - Cytology - PAP  3. Irregular menses - discussed options for hormonal control of menses - cannot remember to take pill daily - see procedure note    Meds ordered this encounter  Medications  . levonorgestrel (MIRENA) 20 MCG/24HR IUD     Return in about 4 weeks (around 07/01/2018) for string check.   The entirety of the information documented in the History of Present Illness, Review of Systems and Physical Exam were personally obtained by me. Portions of this information were initially documented by Presley Raddle, CMA and reviewed by me for thoroughness and accuracy.    Erasmo Downer, MD, MPH [facility] 06/03/2018 11:09 AM

## 2018-06-05 LAB — CYTOLOGY - PAP
Diagnosis: NEGATIVE
HPV: NOT DETECTED

## 2018-06-20 ENCOUNTER — Encounter: Payer: Self-pay | Admitting: Family Medicine

## 2018-07-01 ENCOUNTER — Ambulatory Visit: Payer: BLUE CROSS/BLUE SHIELD | Admitting: Family Medicine

## 2019-03-20 ENCOUNTER — Ambulatory Visit: Payer: BC Managed Care – PPO | Attending: Internal Medicine

## 2019-03-20 DIAGNOSIS — Z20822 Contact with and (suspected) exposure to covid-19: Secondary | ICD-10-CM

## 2019-03-26 LAB — NOVEL CORONAVIRUS, NAA

## 2020-03-15 ENCOUNTER — Ambulatory Visit: Payer: BLUE CROSS/BLUE SHIELD | Admitting: Physician Assistant

## 2020-03-16 ENCOUNTER — Telehealth: Payer: Self-pay

## 2020-03-16 NOTE — Telephone Encounter (Signed)
Patient called again to see about having her appointment changed.  She was scheduled with Adriana for CPE and removal of IUD.  Appt was cancelled since Adriana does not remove IUDs.  Patient was advised that we would have to check with Dr B if she would be able to do both and how late in the day she could do it because patient stated it would have to be late since her husband had taken off from work to watch children and he would not be able to do that again.   Patient knows that we will call her when Dr B is back in the office

## 2020-03-22 NOTE — Telephone Encounter (Signed)
These really should be separate appts. Usually, patient sees PCP for CPE and me for procedure.  If really unable to come twice, I could do both in a 40 min appt, but it will likely be a while until I have a 40 min availability, unfortunately. If I am just doing procedure, only need 20 min

## 2020-03-22 NOTE — Telephone Encounter (Signed)
Please advise 

## 2020-04-05 ENCOUNTER — Ambulatory Visit: Payer: Self-pay | Admitting: Family Medicine

## 2020-04-12 ENCOUNTER — Ambulatory Visit: Payer: BC Managed Care – PPO | Admitting: Family Medicine

## 2020-04-12 ENCOUNTER — Ambulatory Visit: Payer: Self-pay | Admitting: Family Medicine

## 2020-04-15 ENCOUNTER — Ambulatory Visit: Payer: BC Managed Care – PPO | Admitting: Family Medicine

## 2020-04-20 ENCOUNTER — Ambulatory Visit: Payer: Self-pay | Admitting: Family Medicine

## 2020-05-27 ENCOUNTER — Encounter: Payer: Self-pay | Admitting: Family Medicine

## 2020-05-27 ENCOUNTER — Ambulatory Visit (INDEPENDENT_AMBULATORY_CARE_PROVIDER_SITE_OTHER): Payer: BC Managed Care – PPO | Admitting: Family Medicine

## 2020-05-27 ENCOUNTER — Other Ambulatory Visit: Payer: Self-pay

## 2020-05-27 ENCOUNTER — Encounter: Payer: BC Managed Care – PPO | Admitting: Family Medicine

## 2020-05-27 VITALS — BP 138/91 | HR 96 | Ht 67.0 in | Wt 219.0 lb

## 2020-05-27 DIAGNOSIS — N926 Irregular menstruation, unspecified: Secondary | ICD-10-CM | POA: Diagnosis not present

## 2020-05-27 DIAGNOSIS — Z01419 Encounter for gynecological examination (general) (routine) without abnormal findings: Secondary | ICD-10-CM | POA: Diagnosis not present

## 2020-05-27 DIAGNOSIS — Z30432 Encounter for removal of intrauterine contraceptive device: Secondary | ICD-10-CM

## 2020-05-27 NOTE — Progress Notes (Signed)
Would like to get IUD removed due to irregular cycles

## 2020-05-27 NOTE — Patient Instructions (Signed)
Preventive Care 11-34 Years Old, Female Preventive care refers to lifestyle choices and visits with your health care provider that can promote health and wellness. This includes:  A yearly physical exam. This is also called an annual wellness visit.  Regular dental and eye exams.  Immunizations.  Screening for certain conditions.  Healthy lifestyle choices, such as: ? Eating a healthy diet. ? Getting regular exercise. ? Not using drugs or products that contain nicotine and tobacco. ? Limiting alcohol use. What can I expect for my preventive care visit? Physical exam Your health care provider may check your:  Height and weight. These may be used to calculate your BMI (body mass index). BMI is a measurement that tells if you are at a healthy weight.  Heart rate and blood pressure.  Body temperature.  Skin for abnormal spots. Counseling Your health care provider may ask you questions about your:  Past medical problems.  Family's medical history.  Alcohol, tobacco, and drug use.  Emotional well-being.  Home life and relationship well-being.  Sexual activity.  Diet, exercise, and sleep habits.  Work and work Statistician.  Access to firearms.  Method of birth control.  Menstrual cycle.  Pregnancy history. What immunizations do I need? Vaccines are usually given at various ages, according to a schedule. Your health care provider will recommend vaccines for you based on your age, medical history, and lifestyle or other factors, such as travel or where you work.   What tests do I need? Blood tests  Lipid and cholesterol levels. These may be checked every 5 years starting at age 64.  Hepatitis C test.  Hepatitis B test. Screening  Diabetes screening. This is done by checking your blood sugar (glucose) after you have not eaten for a while (fasting).  STD (sexually transmitted disease) testing, if you are at risk.  BRCA-related cancer screening. This may  be done if you have a family history of breast, ovarian, tubal, or peritoneal cancers.  Pelvic exam and Pap test. This may be done every 3 years starting at age 61. Starting at age 82, this may be done every 5 years if you have a Pap test in combination with an HPV test. Talk with your health care provider about your test results, treatment options, and if necessary, the need for more tests.   Follow these instructions at home: Eating and drinking  Eat a healthy diet that includes fresh fruits and vegetables, whole grains, lean protein, and low-fat dairy products.  Take vitamin and mineral supplements as recommended by your health care provider.  Do not drink alcohol if: ? Your health care provider tells you not to drink. ? You are pregnant, may be pregnant, or are planning to become pregnant.  If you drink alcohol: ? Limit how much you have to 0-1 drink a day. ? Be aware of how much alcohol is in your drink. In the [place] one drink equals one 12 oz bottle of beer (355 mL), one 5 oz glass of wine (148 mL), or one 1 oz glass of hard liquor (44 mL).   Lifestyle  Take daily care of your teeth and gums. Brush your teeth every morning and night with fluoride toothpaste. Floss one time each day.  Stay active. Exercise for at least 30 minutes 5 or more days each week.  Do not use any products that contain nicotine or tobacco, such as cigarettes, e-cigarettes, and chewing tobacco. If you need help quitting, ask your health care provider.  Do  not use drugs.  If you are sexually active, practice safe sex. Use a condom or other form of protection to prevent STIs (sexually transmitted infections).  If you do not wish to become pregnant, use a form of birth control. If you plan to become pregnant, see your health care provider for a prepregnancy visit.  Find healthy ways to cope with stress, such as: ? Meditation, yoga, or listening to music. ? Journaling. ? Talking to a trusted  person. ? Spending time with friends and family. Safety  Always wear your seat belt while driving or riding in a vehicle.  Do not drive: ? If you have been drinking alcohol. Do not ride with someone who has been drinking. ? When you are tired or distracted. ? While texting.  Wear a helmet and other protective equipment during sports activities.  If you have firearms in your house, make sure you follow all gun safety procedures.  Seek help if you have been physically or sexually abused. What's next?  Go to your health care provider once a year for an annual wellness visit.  Ask your health care provider how often you should have your eyes and teeth checked.  Stay up to date on all vaccines. This information is not intended to replace advice given to you by your health care provider. Make sure you discuss any questions you have with your health care provider. Document Revised: 11/02/2019 Document Reviewed: 11/15/2017 Elsevier Patient Education  2021 [employer].

## 2020-05-27 NOTE — Progress Notes (Signed)
  Subjective:     [name] is a 35 y.o. female and is here for a comprehensive physical exam. The patient reports problems - irregular cycles. Has Mirena in for a few years and wants it removed in hopes of re-setting her cycles. She is s/p BTL with last delivery..   The following portions of the patient's history were reviewed and updated as appropriate: allergies, current medications, past family history, past medical history, past social history, past surgical history and problem list.  Review of Systems Pertinent items noted in HPI and remainder of comprehensive ROS otherwise negative.   Objective:    BP (!) 138/91   Pulse 96   Ht 5\' 7"  (1.702 m)   Wt 219 lb (99.3 kg)   BMI 34.30 kg/m  General appearance: alert, cooperative and appears stated age Head: Normocephalic, without obvious abnormality, atraumatic Neck: no adenopathy, supple, symmetrical, trachea midline and thyroid not enlarged, symmetric, no tenderness/mass/nodules Lungs: clear to auscultation bilaterally Breasts: normal appearance, no masses or tenderness Heart: regular rate and rhythm, S1, S2 normal, no murmur, click, rub or gallop Abdomen: soft, non-tender; bowel sounds normal; no masses,  no organomegaly Pelvic: cervix normal in appearance, external genitalia normal, no adnexal masses or tenderness, no cervical motion tenderness, uterus normal size, shape, and consistency, vagina normal without discharge and IUD strings noted Extremities: extremities normal, atraumatic, no cyanosis or edema Pulses: 2+ and symmetric Skin: Skin color, texture, turgor normal. No rashes or lesions Lymph nodes: Cervical, supraclavicular, and axillary nodes normal. Neurologic: Grossly normal   Procedure: Speculum placed inside vagina.  Cervix visualized.  Strings grasped with ring forceps.  IUD removed intact.  Assessment:    Healthy female exam.      Plan:   Problem List Items Addressed This Visit      Unprioritized    Irregular menses    If remains a problem, return for further discussion of other treatment options. Has been on OC's in the past, and she has trouble remembering to take meds.       Other Visit Diagnoses    Encounter for gynecological examination without abnormal finding    -  Primary   has PCP, recent pap 05/2018 WNL   Encounter for IUD removal       s/p removal--no issues.     Return in 1 year (on 05/27/2021).    See After Visit Summary for Counseling Recommendations

## 2020-05-29 ENCOUNTER — Encounter: Payer: Self-pay | Admitting: Family Medicine

## 2020-05-29 NOTE — Assessment & Plan Note (Signed)
If remains a problem, return for further discussion of other treatment options. Has been on OC's in the past, and she has trouble remembering to take meds.

## 2023-03-12 ENCOUNTER — Ambulatory Visit: Payer: Self-pay | Admitting: Family

## 2023-05-29 ENCOUNTER — Observation Stay (HOSPITAL_COMMUNITY)
Admission: EM | Admit: 2023-05-29 | Discharge: 2023-05-31 | Disposition: A | Discharge status: Home / Self Care | Attending: Surgery | Admitting: Surgery

## 2023-05-29 ENCOUNTER — Emergency Department (HOSPITAL_COMMUNITY)

## 2023-05-29 ENCOUNTER — Other Ambulatory Visit: Payer: Self-pay

## 2023-05-29 ENCOUNTER — Encounter (HOSPITAL_COMMUNITY): Payer: Self-pay | Admitting: Emergency Medicine

## 2023-05-29 DIAGNOSIS — K8050 Calculus of bile duct without cholangitis or cholecystitis without obstruction: Principal | ICD-10-CM

## 2023-05-29 DIAGNOSIS — K8020 Calculus of gallbladder without cholecystitis without obstruction: Secondary | ICD-10-CM | POA: Diagnosis present

## 2023-05-29 DIAGNOSIS — K8000 Calculus of gallbladder with acute cholecystitis without obstruction: Principal | ICD-10-CM | POA: Insufficient documentation

## 2023-05-29 DIAGNOSIS — R1011 Right upper quadrant pain: Secondary | ICD-10-CM | POA: Diagnosis present

## 2023-05-29 LAB — COMPREHENSIVE METABOLIC PANEL
ALT: 23 U/L (ref 0–44)
AST: 19 U/L (ref 15–41)
Albumin: 4.3 g/dL (ref 3.5–5.0)
Alkaline Phosphatase: 83 U/L (ref 38–126)
Anion gap: 11 (ref 5–15)
BUN: 6 mg/dL (ref 6–20)
CO2: 23 mmol/L (ref 22–32)
Calcium: 10.5 mg/dL — ABNORMAL HIGH (ref 8.9–10.3)
Chloride: 102 mmol/L (ref 98–111)
Creatinine, Ser: 0.98 mg/dL (ref 0.44–1.00)
GFR, Estimated: >60 mL/min (ref >60)
Glucose, Bld: 146 mg/dL — ABNORMAL HIGH (ref 70–99)
Potassium: 3.9 mmol/L (ref 3.5–5.1)
Sodium: 136 mmol/L (ref 135–145)
Total Bilirubin: 0.7 mg/dL (ref 0.0–1.2)
Total Protein: 7.9 g/dL (ref 6.5–8.1)

## 2023-05-29 LAB — I-STAT CHEM 8, ED
BUN: 7 mg/dL (ref 6–20)
Calcium, Ion: 1.17 mmol/L (ref 1.15–1.40)
Chloride: 102 mmol/L (ref 98–111)
Creatinine, Ser: 0.8 mg/dL (ref 0.44–1.00)
Glucose, Bld: 141 mg/dL — ABNORMAL HIGH (ref 70–99)
HCT: 40 % (ref 36.0–46.0)
Hemoglobin: 13.6 g/dL (ref 12.0–15.0)
Potassium: 3.9 mmol/L (ref 3.5–5.1)
Sodium: 138 mmol/L (ref 135–145)
TCO2: 25 mmol/L (ref 22–32)

## 2023-05-29 LAB — CBC WITH DIFFERENTIAL/PLATELET
Abs Immature Granulocytes: 0.08 10*3/uL — ABNORMAL HIGH (ref 0.00–0.07)
Basophils Absolute: 0.1 10*3/uL (ref 0.0–0.1)
Basophils Relative: 0 %
Eosinophils Absolute: 0 10*3/uL (ref 0.0–0.5)
Eosinophils Relative: 0 %
HCT: 41 % (ref 36.0–46.0)
Hemoglobin: 14.1 g/dL (ref 12.0–15.0)
Immature Granulocytes: 1 %
Lymphocytes Relative: 12 %
Lymphs Abs: 1.8 10*3/uL (ref 0.7–4.0)
MCH: 30.5 pg (ref 26.0–34.0)
MCHC: 34.4 g/dL (ref 30.0–36.0)
MCV: 88.6 fL (ref 80.0–100.0)
Monocytes Absolute: 0.6 10*3/uL (ref 0.1–1.0)
Monocytes Relative: 4 %
Neutro Abs: 12.1 10*3/uL — ABNORMAL HIGH (ref 1.7–7.7)
Neutrophils Relative %: 83 %
Platelets: 326 10*3/uL (ref 150–400)
RBC: 4.63 MIL/uL (ref 3.87–5.11)
RDW: 13.1 % (ref 11.5–15.5)
WBC: 14.6 10*3/uL — ABNORMAL HIGH (ref 4.0–10.5)
nRBC: 0 % (ref 0.0–0.2)

## 2023-05-29 LAB — HCG, QUANTITATIVE, PREGNANCY: hCG, Beta Chain, Quant, S: <1 m[IU]/mL (ref <5)

## 2023-05-29 LAB — URINALYSIS, W/ REFLEX TO CULTURE (INFECTION SUSPECTED)
Bilirubin Urine: NEGATIVE
Glucose, UA: NEGATIVE mg/dL
Ketones, ur: 20 mg/dL — AB
Nitrite: NEGATIVE
Protein, ur: NEGATIVE mg/dL
Specific Gravity, Urine: 1.016 (ref 1.005–1.030)
pH: 8 (ref 5.0–8.0)

## 2023-05-29 LAB — LIPASE, BLOOD: Lipase: 22 U/L (ref 11–51)

## 2023-05-29 MED ORDER — ONDANSETRON HCL 4 MG/2ML IJ SOLN
4.0000 mg | Freq: Once | INTRAMUSCULAR | Status: AC
  Administered 2023-05-30: 4 mg via INTRAVENOUS
  Filled 2023-05-29: qty 2

## 2023-05-29 MED ORDER — HYDROMORPHONE HCL 1 MG/ML IJ SOLN
1.0000 mg | Freq: Once | INTRAMUSCULAR | Status: AC
  Administered 2023-05-29: 1 mg via INTRAVENOUS
  Filled 2023-05-29: qty 1

## 2023-05-29 MED ORDER — ONDANSETRON HCL 4 MG/2ML IJ SOLN
4.0000 mg | Freq: Once | INTRAMUSCULAR | Status: AC
  Administered 2023-05-29: 4 mg via INTRAVENOUS
  Filled 2023-05-29: qty 2

## 2023-05-29 MED ORDER — FENTANYL CITRATE PF 50 MCG/ML IJ SOSY
50.0000 ug | PREFILLED_SYRINGE | Freq: Once | INTRAMUSCULAR | Status: AC
  Administered 2023-05-29: 50 ug via INTRAVENOUS
  Filled 2023-05-29: qty 1

## 2023-05-29 MED ORDER — OXYCODONE HCL 5 MG PO TABS
5.0000 mg | ORAL_TABLET | Freq: Once | ORAL | Status: AC
  Administered 2023-05-29: 5 mg via ORAL
  Filled 2023-05-29: qty 1

## 2023-05-29 MED ORDER — IOHEXOL 350 MG/ML SOLN
75.0000 mL | Freq: Once | INTRAVENOUS | Status: AC | PRN
  Administered 2023-05-29: 75 mL via INTRAVENOUS

## 2023-05-29 NOTE — ED Triage Notes (Signed)
 Pt here for R sided abd pain that radiates to all the way across her back and to the L side of her abd x 2 days. Reports nausea that started today. States the pain has previously gone away but is now constant without relief from OTC medications.

## 2023-05-29 NOTE — ED Provider Notes (Incomplete)
 [place] EMERGENCY DEPARTMENT AT [facility] Provider Note   CSN: [id number] Arrival date & time: 05/29/23  1624     History  Chief Complaint  Patient presents with  . Abdominal Pain    [name] is a 38 y.o. female with medical history of migraines, cesarean sections with tubal ligation.  The patient presents to ED for evaluation of right upper quadrant abdominal pain.  Reports that the pain began 2 days ago but is become more constant in the last 24 hours.  States that the pain radiates from her right upper quadrant into the right side of her back.  She denies any urinary symptoms, chest pain or shortness of breath.  She is endorsing nausea, vomiting.  Denies diarrhea, fevers at home.  Reports last menstrual cycle was 1 week ago.  Denies a history of intra-abdominal surgeries.  Reports last bowel movement was today and without any sort of irregularity.  She denies ever seeing general surgery for any complaints.  Reports she was given pain medication in triage however threw it up.   Abdominal Pain Associated symptoms: nausea and vomiting   Associated symptoms: no chest pain, no diarrhea, no fever and no shortness of breath        Home Medications Prior to Admission medications   Not on File      Allergies    Patient has no known allergies.    Review of Systems   Review of Systems  Constitutional:  Negative for fever.  Respiratory:  Negative for shortness of breath.   Cardiovascular:  Negative for chest pain.  Gastrointestinal:  Positive for abdominal pain, nausea and vomiting. Negative for diarrhea.  All other systems reviewed and are negative.   Physical Exam Updated Vital Signs BP 137/87   Pulse 61   Temp 98 F (36.7 C)   Resp 16   Ht 5\' 7"  (1.702 m)   Wt 97.1 kg   SpO2 100%   BMI 33.52 kg/m  Physical Exam Vitals and nursing note reviewed.  Constitutional:      General: She is not in acute distress.    Appearance: She is well-developed.   HENT:     Head: Normocephalic and atraumatic.  Eyes:     Conjunctiva/sclera: Conjunctivae normal.  Cardiovascular:     Rate and Rhythm: Normal rate and regular rhythm.     Heart sounds: No murmur heard. Pulmonary:     Effort: Pulmonary effort is normal. No respiratory distress.     Breath sounds: Normal breath sounds.  Abdominal:     Palpations: Abdomen is soft.     Tenderness: There is no abdominal tenderness.     Comments: RUQ tenderness to palpation.  Positive Murphy sign.  Musculoskeletal:        General: No swelling.     Cervical back: Neck supple.     Right lower leg: No edema.     Left lower leg: No edema.  Skin:    General: Skin is warm and dry.     Capillary Refill: Capillary refill takes less than 2 seconds.  Neurological:     Mental Status: She is alert and oriented to person, place, and time.  Psychiatric:        Mood and Affect: Mood normal.     ED Results / Procedures / Treatments   Labs (all labs ordered are listed, but only abnormal results are displayed) Labs Reviewed  COMPREHENSIVE METABOLIC PANEL - Abnormal; Notable for the following components:  Result Value   Glucose, Bld 146 (*)    Calcium 10.5 (*)    All other components within normal limits  CBC WITH DIFFERENTIAL/PLATELET - Abnormal; Notable for the following components:   WBC 14.6 (*)    Neutro Abs 12.1 (*)    Abs Immature Granulocytes 0.08 (*)    All other components within normal limits  URINALYSIS, W/ REFLEX TO CULTURE (INFECTION SUSPECTED) - Abnormal; Notable for the following components:   APPearance HAZY (*)    Hgb urine dipstick SMALL (*)    Ketones, ur 20 (*)    Leukocytes,Ua SMALL (*)    Bacteria, UA RARE (*)    All other components within normal limits  I-STAT CHEM 8, ED - Abnormal; Notable for the following components:   Glucose, Bld 141 (*)    All other components within normal limits  LIPASE, BLOOD  HCG, QUANTITATIVE, PREGNANCY    EKG None  Radiology US Abdomen  Limited RUQ (LIVER/GB) Result Date: 05/29/2023 CLINICAL DATA:  Right upper quadrant pain EXAM: ULTRASOUND ABDOMEN LIMITED RIGHT UPPER QUADRANT COMPARISON:  None Available. FINDINGS: Gallbladder: There is a 1.4 cm echogenic mass in the gallbladder lumen, potentially focal sludge. No gallbladder wall thickening or pericholecystic fluid. Negative sonographic Murphy's sign. Common bile duct: Diameter: 9 mm, prominent Liver: No focal lesion identified. Within normal limits in parenchymal echogenicity. Portal vein is patent on color Doppler imaging with normal direction of blood flow towards the liver. Other: Incidentally identified renal cysts measuring up to 1.4 cm. IMPRESSION: 1. There is a 1.4 cm echogenic mass in the gallbladder lumen, potentially focal sludge. Recommend follow-up right upper quadrant ultrasound in 6 months to assess for stability. 2. Common bile duct is prominent measuring 9 mm. Recommend correlation with LFTs. If there is concern for biliary obstruction, recommend MRI/MRCP. 3. No evidence for acute cholecystitis. Electronically Signed   By: Annia Belt M.D.   On: 05/29/2023 18:50    Procedures Procedures    Medications Ordered in ED Medications  ondansetron (ZOFRAN) injection 4 mg (has no administration in time range)  fentaNYL (SUBLIMAZE) injection 50 mcg (has no administration in time range)  oxyCODONE (Oxy IR/ROXICODONE) immediate release tablet 5 mg (5 mg Oral Given 05/29/23 1721)    ED Course/ Medical Decision Making/ A&P  Medical Decision Making Risk Prescription drug management.   ***   Final Clinical Impression(s) / ED Diagnoses Final diagnoses:  None    Rx / DC Orders ED Discharge Orders     None

## 2023-05-29 NOTE — ED Notes (Signed)
 Patient transported to CT

## 2023-05-29 NOTE — ED Provider Notes (Signed)
 [place] EMERGENCY DEPARTMENT AT [facility] Provider Note   CSN: [id number] Arrival date & time: 05/29/23  1624     History  Chief Complaint  Patient presents with   Abdominal Pain    [name] is a 38 y.o. female with medical history of migraines, cesarean sections with tubal ligation.  The patient presents to ED for evaluation of right upper quadrant abdominal pain.  Reports that the pain began 2 days ago but is become more constant in the last 24 hours.  States that the pain radiates from her right upper quadrant into the right side of her back.  She denies any urinary symptoms, chest pain or shortness of breath.  She is endorsing nausea, vomiting.  Denies diarrhea, fevers at home.  Reports last menstrual cycle was 1 week ago.  Denies a history of intra-abdominal surgeries.  Reports last bowel movement was today and without any sort of irregularity.  She denies ever seeing general surgery for any complaints.  Reports she was given pain medication in triage however threw it up.   Abdominal Pain Associated symptoms: nausea and vomiting   Associated symptoms: no chest pain, no diarrhea, no fever and no shortness of breath        Home Medications Prior to Admission medications   Not on File      Allergies    Patient has no known allergies.    Review of Systems   Review of Systems  Constitutional:  Negative for fever.  Respiratory:  Negative for shortness of breath.   Cardiovascular:  Negative for chest pain.  Gastrointestinal:  Positive for abdominal pain, nausea and vomiting. Negative for diarrhea.  All other systems reviewed and are negative.   Physical Exam Updated Vital Signs BP (!) 161/98 (BP Location: Right Arm)   Pulse (!) 58   Temp 98.5 F (36.9 C) (Oral)   Resp 18   Ht 5\' 7"  (1.702 m)   Wt 97.1 kg   SpO2 100%   BMI 33.52 kg/m  Physical Exam Vitals and nursing note reviewed.  Constitutional:      General: She is not in acute  distress.    Appearance: She is well-developed.  HENT:     Head: Normocephalic and atraumatic.  Eyes:     Conjunctiva/sclera: Conjunctivae normal.  Cardiovascular:     Rate and Rhythm: Normal rate and regular rhythm.     Heart sounds: No murmur heard. Pulmonary:     Effort: Pulmonary effort is normal. No respiratory distress.     Breath sounds: Normal breath sounds.  Abdominal:     Palpations: Abdomen is soft.     Tenderness: There is no abdominal tenderness.     Comments: RUQ tenderness to palpation.  Positive Murphy sign.  Musculoskeletal:        General: No swelling.     Cervical back: Neck supple.     Right lower leg: No edema.     Left lower leg: No edema.  Skin:    General: Skin is warm and dry.     Capillary Refill: Capillary refill takes less than 2 seconds.  Neurological:     Mental Status: She is alert and oriented to person, place, and time.  Psychiatric:        Mood and Affect: Mood normal.     ED Results / Procedures / Treatments   Labs (all labs ordered are listed, but only abnormal results are displayed) Labs Reviewed  COMPREHENSIVE METABOLIC PANEL -  Abnormal; Notable for the following components:      Result Value   Glucose, Bld 146 (*)    Calcium 10.5 (*)    All other components within normal limits  CBC WITH DIFFERENTIAL/PLATELET - Abnormal; Notable for the following components:   WBC 14.6 (*)    Neutro Abs 12.1 (*)    Abs Immature Granulocytes 0.08 (*)    All other components within normal limits  URINALYSIS, W/ REFLEX TO CULTURE (INFECTION SUSPECTED) - Abnormal; Notable for the following components:   APPearance HAZY (*)    Hgb urine dipstick SMALL (*)    Ketones, ur 20 (*)    Leukocytes,Ua SMALL (*)    Bacteria, UA RARE (*)    All other components within normal limits  I-STAT CHEM 8, ED - Abnormal; Notable for the following components:   Glucose, Bld 141 (*)    All other components within normal limits  LIPASE, BLOOD  HCG, QUANTITATIVE,  PREGNANCY    EKG None  Radiology CT ABDOMEN PELVIS W CONTRAST Result Date: 05/29/2023 CLINICAL DATA:  Abdominal/flank pain, stone suspected RUQ abdominal pain radiating to R flank, US shows sludge however no cholecystitis, assess for stones vs pyelo EXAM: CT ABDOMEN AND PELVIS WITH CONTRAST TECHNIQUE: Multidetector CT imaging of the abdomen and pelvis was performed using the standard protocol following bolus administration of intravenous contrast. RADIATION DOSE REDUCTION: This exam was performed according to the departmental dose-optimization program which includes automated exposure control, adjustment of the mA and/or kV according to patient size and/or use of iterative reconstruction technique. CONTRAST:  75mL OMNIPAQUE IOHEXOL 350 MG/ML SOLN COMPARISON:  Ultrasound abdomen 05/29/2023 FINDINGS: Lower chest: No acute abnormality.  Tiny hiatal hernia. Hepatobiliary: The liver is enlarged measuring up to 22 cm. No focal liver abnormality. Calcified gallstone noted within the gallbladder lumen. No gallbladder wall thickening or pericholecystic fluid. Dilated common bile duct measuring up to 12 mm. No CT evidence of choledocholithiasis. No intrahepatic biliary ductal dilatation. Pancreas: No focal lesion. Normal pancreatic contour. No surrounding inflammatory changes. No main pancreatic ductal dilatation. Spleen: Normal in size without focal abnormality. Adrenals/Urinary Tract: No adrenal nodule bilaterally. Bilateral kidneys enhance symmetrically. Fluid density lesions likely represent simple renal cysts. Simple renal cysts, in the absence of clinically indicated signs/symptoms, require no independent follow-up. No hydronephrosis. No hydroureter. Punctate nephrolithiasis on the right. No left nephrolithiasis. No ureterolithiasis bilaterally. The urinary bladder is unremarkable. Stomach/Bowel: Stomach is within normal limits. No evidence of bowel wall thickening or dilatation. Appendix appears normal.  Vascular/Lymphatic: No abdominal aorta or iliac aneurysm. No abdominal, pelvic, or inguinal lymphadenopathy. Reproductive: Uterus and bilateral adnexa are unremarkable. Other: No intraperitoneal free fluid. No intraperitoneal free gas. No organized fluid collection. Musculoskeletal: Tiny fat containing umbilical hernia. No suspicious lytic or blastic osseous lesions. No acute displaced fracture. IMPRESSION: 1. Tiny hiatal hernia. 2. Cholelithiasis no CT evidence of acute cholecystitis. Dilated common bile duct measuring up to 12 mm with no CT evidence of choledocholithiasis. No intrahepatic biliary ductal dilatation. 3. Nonobstructive punctate right nephrolithiasis. 4. Hepatomegaly. Electronically Signed   By: Tish Frederickson M.D.   On: 05/29/2023 23:41   US Abdomen Limited RUQ (LIVER/GB) Result Date: 05/29/2023 CLINICAL DATA:  Right upper quadrant pain EXAM: ULTRASOUND ABDOMEN LIMITED RIGHT UPPER QUADRANT COMPARISON:  None Available. FINDINGS: Gallbladder: There is a 1.4 cm echogenic mass in the gallbladder lumen, potentially focal sludge. No gallbladder wall thickening or pericholecystic fluid. Negative sonographic Murphy's sign. Common bile duct: Diameter: 9 mm, prominent Liver: No focal  lesion identified. Within normal limits in parenchymal echogenicity. Portal vein is patent on color Doppler imaging with normal direction of blood flow towards the liver. Other: Incidentally identified renal cysts measuring up to 1.4 cm. IMPRESSION: 1. There is a 1.4 cm echogenic mass in the gallbladder lumen, potentially focal sludge. Recommend follow-up right upper quadrant ultrasound in 6 months to assess for stability. 2. Common bile duct is prominent measuring 9 mm. Recommend correlation with LFTs. If there is concern for biliary obstruction, recommend MRI/MRCP. 3. No evidence for acute cholecystitis. Electronically Signed   By: Annia Belt M.D.   On: 05/29/2023 18:50    Procedures Procedures    Medications Ordered  in ED Medications  ondansetron (ZOFRAN) injection 4 mg (has no administration in time range)  oxyCODONE (Oxy IR/ROXICODONE) immediate release tablet 5 mg (5 mg Oral Given 05/29/23 1721)  ondansetron (ZOFRAN) injection 4 mg (4 mg Intravenous Given 05/29/23 2033)  fentaNYL (SUBLIMAZE) injection 50 mcg (50 mcg Intravenous Given 05/29/23 2034)  HYDROmorphone (DILAUDID) injection 1 mg (1 mg Intravenous Given 05/29/23 2117)  iohexol (OMNIPAQUE) 350 MG/ML injection 75 mL (75 mLs Intravenous Contrast Given 05/29/23 2148)  HYDROmorphone (DILAUDID) injection 1 mg (1 mg Intravenous Given 05/29/23 2353)    ED Course/ Medical Decision Making/ A&P  Medical Decision Making Risk Prescription drug management.    38 year old female presents for evaluation.  Please see HPI for further details.  On examination, the patient is afebrile and nontachycardic.  Her lung sounds are clear bilaterally, she is not hypoxic.  Her abdomen has tenderness in the right upper quadrant with a positive Murphy sign.  Neurological exam is baseline.  Patient is writhing in pain in the bed.  Suspect patient having acute cholecystitis versus biliary colic.  Will assess for the same.  Patient CBC with a leukocytosis of 14.6, baseline hemoglobin.  CMP without electrolyte derangement, no elevated LFTs.  Urinalysis shows small hemoglobin, ketones, leukocytes however patient denies dysuria.  Lipase WNL.  I-STAT Chem-8 unremarkable.  Patient initial ultrasound of right upper quadrant shows biliary sludge however no acute cholecystitis.  Also shows common bile duct is predominant measuring 9 mm however patient LFTs are not elevated.  CT scan of patient abdomen pelvis shows no evidence of acute cholecystitis however there is a dilated, bile duct measuring up to 12 mm no CT evidence of choledocholithiasis.    Patient is required multiple rounds of Dilaudid here.  Also requesting multiple rounds of Zofran.  Will reach out to general surgery to  discuss.  Spoke with Dr. Dossie Der who believes patient most likely needs cholecystectomy. States we can admit patient and have her seen by his colleague in the morning, Dr. Freida Busman. The patient is agreeable to this plan. Dr. Dossie Der states he will come and admit the patient. Patient agreeable to plan. Stable at time of admission.    Final Clinical Impression(s) / ED Diagnoses Final diagnoses:  Biliary colic    Rx / DC Orders ED Discharge Orders     None         Al Decant, PA-C 05/30/23 0001    Eber Hong, MD 05/31/23 [phone]

## 2023-05-29 NOTE — ED Provider Triage Note (Signed)
 Emergency Medicine Provider Triage Evaluation Note  [name] , a 38 y.o. female  was evaluated in triage.  Pt complains of RUQ pain.  Review of Systems  Positive:  Negative:   Physical Exam  BP 137/87   Pulse 61   Temp 98 F (36.7 C)   Resp 16   Ht 5\' 7"  (1.702 m)   Wt 97.1 kg   SpO2 100%   BMI 33.52 kg/m  Gen:   Awake, no distress   Resp:  Normal effort  MSK:   Moves extremities without difficulty  Other:    Medical Decision Making  Medically screening exam initiated at 4:48 PM.  Appropriate orders placed.  [name] was informed that the remainder of the evaluation will be completed by another provider, this initial triage assessment does not replace that evaluation, and the importance of remaining in the ED until their evaluation is complete.  Intermittent RUQ pain is now constant and strong. Patient also with nausea and vomiting today. Denies fever, diarrhea, hematemesis, hematochezia.    Dorthy Cooler, [place] 05/29/23 1649

## 2023-05-29 NOTE — H&P (Signed)
 Admitting Physician: Hyman Hopes Laddie Naeem  Service: General Surgery  CC: Abdominal pain  Subjective   HPI: [name] is an 38 y.o. female who is here for abdominal pain.  Pain started Sunday night.  She had chili for dinner.  She avoids fatty foods.  The pain has come and gone since then but each attack seems worse.  It would not go away today so she presented to the ER.  Had some relief with pain medication.  Pain is worse every time she eats.  Pain is located in the right upper quadrant but wraps around into her back.  Past Medical History:  Diagnosis Date   Abnormal Pap smear    Migraine    Rh negative state in antepartum period     Past Surgical History:  Procedure Laterality Date   CESAREAN SECTION  10/07/2011   Procedure: CESAREAN SECTION;  Surgeon: Willodean Rosenthal, MD;  Location: [facility];  Service: Obstetrics;  Laterality: N/A;   CESAREAN SECTION WITH BILATERAL TUBAL LIGATION N/A 12/19/2012   Procedure: CESAREAN SECTION WITH BILATERAL TUBAL LIGATION;  Surgeon: Catalina Antigua, MD;  Location: [facility];  Service: Obstetrics;  Laterality: N/A;   DILATION AND CURETTAGE OF UTERUS     TONSILLECTOMY     38 yrs old    Family History  Problem Relation Age of Onset   Diabetes Maternal Grandmother    Diabetes Maternal Grandfather    Depression Mother    Anxiety disorder Mother    Alcohol abuse Father    Depression Father    COPD Father    Anesthesia problems Neg Hx    Hypotension Neg Hx    Malignant hyperthermia Neg Hx    Pseudochol deficiency Neg Hx     Social:  reports that she has never smoked. She has never used smokeless tobacco. She reports that she does not drink alcohol and does not use drugs.  Allergies: No Known Allergies  Medications: No current outpatient medications  ROS - all of the below systems have been reviewed with the patient and positives are indicated with bold text General: chills, fever or night sweats Eyes: blurry vision or double  vision ENT: epistaxis or sore throat Allergy/Immunology: itchy/watery eyes or nasal congestion Hematologic/Lymphatic: bleeding problems, blood clots or swollen lymph nodes Endocrine: temperature intolerance or unexpected weight changes Breast: new or changing breast lumps or nipple discharge Resp: cough, shortness of breath, or wheezing CV: chest pain or dyspnea on exertion GI: as per HPI GU: dysuria, trouble voiding, or hematuria MSK: joint pain or joint stiffness Neuro: TIA or stroke symptoms Derm: pruritus and skin lesion changes Psych: anxiety and depression  Objective   PE Blood pressure (!) 161/98, pulse (!) 58, temperature 98.5 F (36.9 C), temperature source Oral, resp. rate 18, height 5\' 7"  (1.702 m), weight 97.1 kg, SpO2 100%. Constitutional: NAD; conversant; no deformities Eyes: Moist conjunctiva; no lid lag; anicteric; PERRL Neck: Trachea midline; no thyromegaly Lungs: Normal respiratory effort; no tactile fremitus CV: RRR; no palpable thrills; no pitting edema GI: Abd Soft, tender RUQ; no palpable hepatosplenomegaly MSK: Normal range of motion of extremities; no clubbing/cyanosis Psychiatric: Appropriate affect; alert and oriented x3 Lymphatic: No palpable cervical or axillary lymphadenopathy  Results for orders placed or performed during the hospital encounter of 05/29/23 (from the past 24 hours)  Urinalysis, w/ Reflex to Culture (Infection Suspected) -Urine, Clean Catch     Status: Abnormal   Collection Time: 05/29/23  4:45 PM  Result Value Ref Range  Specimen Source URINE, CLEAN CATCH    Color, Urine YELLOW YELLOW   APPearance HAZY (A) CLEAR   Specific Gravity, Urine 1.016 1.005 - 1.030   pH 8.0 5.0 - 8.0   Glucose, UA NEGATIVE NEGATIVE mg/dL   Hgb urine dipstick SMALL (A) NEGATIVE   Bilirubin Urine NEGATIVE NEGATIVE   Ketones, ur 20 (A) NEGATIVE mg/dL   Protein, ur NEGATIVE NEGATIVE mg/dL   Nitrite NEGATIVE NEGATIVE   Leukocytes,Ua SMALL (A) NEGATIVE    RBC / HPF 0-5 0 - 5 RBC/hpf   WBC, UA 11-20 0 - 5 WBC/hpf   Bacteria, UA RARE (A) NONE SEEN   Squamous Epithelial / HPF 11-20 0 - 5 /HPF   Mucus PRESENT   Comprehensive metabolic panel     Status: Abnormal   Collection Time: 05/29/23  4:54 PM  Result Value Ref Range   Sodium 136 135 - 145 mmol/L   Potassium 3.9 3.5 - 5.1 mmol/L   Chloride 102 98 - 111 mmol/L   CO2 23 22 - 32 mmol/L   Glucose, Bld 146 (H) 70 - 99 mg/dL   BUN 6 6 - 20 mg/dL   Creatinine, Ser 1.61 0.44 - 1.00 mg/dL   Calcium 09.6 (H) 8.9 - 10.3 mg/dL   Total Protein 7.9 6.5 - 8.1 g/dL   Albumin 4.3 3.5 - 5.0 g/dL   AST 19 15 - 41 U/L   ALT 23 0 - 44 U/L   Alkaline Phosphatase 83 38 - 126 U/L   Total Bilirubin 0.7 0.0 - 1.2 mg/dL   GFR, Estimated >04 >54 mL/min   Anion gap 11 5 - 15  Lipase, blood     Status: None   Collection Time: 05/29/23  4:54 PM  Result Value Ref Range   Lipase 22 11 - 51 U/L  CBC with Diff     Status: Abnormal   Collection Time: 05/29/23  4:54 PM  Result Value Ref Range   WBC 14.6 (H) 4.0 - 10.5 K/uL   RBC 4.63 3.87 - 5.11 MIL/uL   Hemoglobin 14.1 12.0 - 15.0 g/dL   HCT 09.8 11.9 - 14.7 %   MCV 88.6 80.0 - 100.0 fL   MCH 30.5 26.0 - 34.0 pg   MCHC 34.4 30.0 - 36.0 g/dL   RDW 82.9 56.2 - 13.0 %   Platelets 326 150 - 400 K/uL   nRBC 0.0 0.0 - 0.2 %   Neutrophils Relative % 83 %   Neutro Abs 12.1 (H) 1.7 - 7.7 K/uL   Lymphocytes Relative 12 %   Lymphs Abs 1.8 0.7 - 4.0 K/uL   Monocytes Relative 4 %   Monocytes Absolute 0.6 0.1 - 1.0 K/uL   Eosinophils Relative 0 %   Eosinophils Absolute 0.0 0.0 - 0.5 K/uL   Basophils Relative 0 %   Basophils Absolute 0.1 0.0 - 0.1 K/uL   Immature Granulocytes 1 %   Abs Immature Granulocytes 0.08 (H) 0.00 - 0.07 K/uL  hCG, quantitative, pregnancy     Status: None   Collection Time: 05/29/23  4:54 PM  Result Value Ref Range   hCG, Beta Chain, Quant, S <1 <5 mIU/mL  I-stat chem 8, ED (not at [facility], [facility] or [facility])     Status: Abnormal   Collection  Time: 05/29/23  4:57 PM  Result Value Ref Range   Sodium 138 135 - 145 mmol/L   Potassium 3.9 3.5 - 5.1 mmol/L   Chloride 102 98 - 111 mmol/L  BUN 7 6 - 20 mg/dL   Creatinine, Ser 1.61 0.44 - 1.00 mg/dL   Glucose, Bld 096 (H) 70 - 99 mg/dL   Calcium, Ion 0.45 4.09 - 1.40 mmol/L   TCO2 25 22 - 32 mmol/L   Hemoglobin 13.6 12.0 - 15.0 g/dL   HCT 81.1 91.4 - 78.2 %     Imaging Orders         US Abdomen Limited RUQ (LIVER/GB)         CT ABDOMEN PELVIS W CONTRAST      Assessment and Plan   DEON IVEY is an 38 y.o. female with abdominal pain.  CT and ultrasound reviewed, gallstones and sludge without other signs of acute cholecystitis.  Bile duct is prominent, 9mm.  There is no other clear explanation for her symptoms and the symptoms seem consistent with billiary colic.  We discussed discharge from ER with outpatient follow up vs. Observation in the hospital with repeat labs in the morning, possible HIDA scan, possible cholecystectomy depending on evaluation by Dr. Freida Busman who takes over the service in the morning.  She does not want to continue to deal with these symptoms and would prefer to have her gallbladder removed.  I will admit her to observation in the hospital, recheck labs in the morning, and discuss treatment plan and possible cholecystectomy with Dr. Freida Busman in the morning.  Quentin Ore, MD  [facility] Use [email or web address] to contact on call provider  New Patient Billing: [id number] - Moderate MDM

## 2023-05-30 ENCOUNTER — Observation Stay (HOSPITAL_COMMUNITY)

## 2023-05-30 ENCOUNTER — Encounter (HOSPITAL_COMMUNITY): Payer: Self-pay

## 2023-05-30 ENCOUNTER — Other Ambulatory Visit: Payer: Self-pay

## 2023-05-30 ENCOUNTER — Encounter (HOSPITAL_COMMUNITY)
Admission: EM | Disposition: A | Discharge status: Home / Self Care | Payer: Self-pay | Source: Home / Self Care | Attending: Emergency Medicine

## 2023-05-30 ENCOUNTER — Observation Stay (HOSPITAL_COMMUNITY): Admitting: Anesthesiology

## 2023-05-30 DIAGNOSIS — K8020 Calculus of gallbladder without cholecystitis without obstruction: Secondary | ICD-10-CM | POA: Diagnosis present

## 2023-05-30 HISTORY — PX: CHOLECYSTECTOMY: SHX55

## 2023-05-30 LAB — BASIC METABOLIC PANEL
Anion gap: 7 (ref 5–15)
BUN: 8 mg/dL (ref 6–20)
CO2: 27 mmol/L (ref 22–32)
Calcium: 9.4 mg/dL (ref 8.9–10.3)
Chloride: 102 mmol/L (ref 98–111)
Creatinine, Ser: 0.98 mg/dL (ref 0.44–1.00)
GFR, Estimated: >60 mL/min (ref >60)
Glucose, Bld: 113 mg/dL — ABNORMAL HIGH (ref 70–99)
Potassium: 3.9 mmol/L (ref 3.5–5.1)
Sodium: 136 mmol/L (ref 135–145)

## 2023-05-30 LAB — CBC
HCT: 38.5 % (ref 36.0–46.0)
Hemoglobin: 13.3 g/dL (ref 12.0–15.0)
MCH: 30.4 pg (ref 26.0–34.0)
MCHC: 34.5 g/dL (ref 30.0–36.0)
MCV: 87.9 fL (ref 80.0–100.0)
Platelets: 308 10*3/uL (ref 150–400)
RBC: 4.38 MIL/uL (ref 3.87–5.11)
RDW: 13.1 % (ref 11.5–15.5)
WBC: 16 10*3/uL — ABNORMAL HIGH (ref 4.0–10.5)
nRBC: 0 % (ref 0.0–0.2)

## 2023-05-30 LAB — HEPATIC FUNCTION PANEL
ALT: 20 U/L (ref 0–44)
AST: 18 U/L (ref 15–41)
Albumin: 3.8 g/dL (ref 3.5–5.0)
Alkaline Phosphatase: 78 U/L (ref 38–126)
Bilirubin, Direct: 0.1 mg/dL (ref 0.0–0.2)
Indirect Bilirubin: 0.8 mg/dL (ref 0.3–0.9)
Total Bilirubin: 0.9 mg/dL (ref 0.0–1.2)
Total Protein: 7.4 g/dL (ref 6.5–8.1)

## 2023-05-30 LAB — HIV ANTIBODY (ROUTINE TESTING W REFLEX): HIV Screen 4th Generation wRfx: NONREACTIVE

## 2023-05-30 SURGERY — LAPAROSCOPIC CHOLECYSTECTOMY WITH INTRAOPERATIVE CHOLANGIOGRAM
Anesthesia: General | Site: Abdomen

## 2023-05-30 MED ORDER — OXYCODONE HCL 5 MG PO TABS
5.0000 mg | ORAL_TABLET | ORAL | Status: DC | PRN
  Administered 2023-05-31: 5 mg via ORAL
  Filled 2023-05-30: qty 1

## 2023-05-30 MED ORDER — OXYCODONE HCL 5 MG PO TABS
10.0000 mg | ORAL_TABLET | ORAL | Status: DC | PRN
  Administered 2023-05-30 (×2): 10 mg via ORAL
  Filled 2023-05-30 (×2): qty 2

## 2023-05-30 MED ORDER — AMISULPRIDE (ANTIEMETIC) 5 MG/2ML IV SOLN: 10.0000 mg | Freq: Once | INTRAVENOUS | Status: DC | PRN

## 2023-05-30 MED ORDER — CEFTRIAXONE SODIUM 2 G IJ SOLR
INTRAMUSCULAR | Status: AC
  Filled 2023-05-30: qty 20

## 2023-05-30 MED ORDER — ONDANSETRON HCL 4 MG/2ML IJ SOLN: 4.0000 mg | Freq: Four times a day (QID) | INTRAMUSCULAR | Status: DC | PRN

## 2023-05-30 MED ORDER — LIDOCAINE 2% (20 MG/ML) 5 ML SYRINGE
INTRAMUSCULAR | Status: AC
  Filled 2023-05-30: qty 20

## 2023-05-30 MED ORDER — DEXAMETHASONE SODIUM PHOSPHATE 10 MG/ML IJ SOLN
INTRAMUSCULAR | Status: AC
  Filled 2023-05-30: qty 4

## 2023-05-30 MED ORDER — PROPOFOL 10 MG/ML IV BOLUS
INTRAVENOUS | Status: DC | PRN
  Administered 2023-05-30: 200 mg via INTRAVENOUS

## 2023-05-30 MED ORDER — SODIUM CHLORIDE 0.9 % IV SOLN
INTRAVENOUS | Status: DC | PRN
  Administered 2023-05-30: 7.5 mL

## 2023-05-30 MED ORDER — KETOROLAC TROMETHAMINE 15 MG/ML IJ SOLN
15.0000 mg | Freq: Three times a day (TID) | INTRAMUSCULAR | Status: DC
  Administered 2023-05-30 – 2023-05-31 (×4): 15 mg via INTRAVENOUS
  Filled 2023-05-30 (×4): qty 1

## 2023-05-30 MED ORDER — SUGAMMADEX SODIUM 200 MG/2ML IV SOLN
INTRAVENOUS | Status: AC
  Filled 2023-05-30: qty 6

## 2023-05-30 MED ORDER — ORAL CARE MOUTH RINSE: 15.0000 mL | Freq: Once | OROMUCOSAL | Status: AC

## 2023-05-30 MED ORDER — PROPOFOL 10 MG/ML IV BOLUS
INTRAVENOUS | Status: AC
  Filled 2023-05-30: qty 20

## 2023-05-30 MED ORDER — LIDOCAINE 2% (20 MG/ML) 5 ML SYRINGE
INTRAMUSCULAR | Status: DC | PRN
  Administered 2023-05-30: 60 mg via INTRAVENOUS

## 2023-05-30 MED ORDER — STERILE WATER FOR IRRIGATION IR SOLN
Status: DC | PRN
  Administered 2023-05-30: 1000 mL

## 2023-05-30 MED ORDER — BUPIVACAINE-EPINEPHRINE 0.25% -1:200000 IJ SOLN
INTRAMUSCULAR | Status: DC | PRN
  Administered 2023-05-30: 22 mL

## 2023-05-30 MED ORDER — OXYCODONE HCL 5 MG/5ML PO SOLN: 5.0000 mg | Freq: Once | ORAL | Status: DC | PRN

## 2023-05-30 MED ORDER — CHLORHEXIDINE GLUCONATE 0.12 % MT SOLN: 15.0000 mL | Freq: Once | OROMUCOSAL | Status: AC

## 2023-05-30 MED ORDER — DOCUSATE SODIUM 100 MG PO CAPS
100.0000 mg | ORAL_CAPSULE | Freq: Two times a day (BID) | ORAL | Status: DC
  Administered 2023-05-30 – 2023-05-31 (×2): 100 mg via ORAL
  Filled 2023-05-30 (×3): qty 1

## 2023-05-30 MED ORDER — MIDAZOLAM HCL 2 MG/2ML IJ SOLN
INTRAMUSCULAR | Status: AC
  Filled 2023-05-30: qty 2

## 2023-05-30 MED ORDER — METHOCARBAMOL 1000 MG/10ML IJ SOLN: 500.0000 mg | Freq: Four times a day (QID) | INTRAMUSCULAR | Status: DC | PRN

## 2023-05-30 MED ORDER — ROCURONIUM BROMIDE 10 MG/ML (PF) SYRINGE
PREFILLED_SYRINGE | INTRAVENOUS | Status: AC
  Filled 2023-05-30: qty 30

## 2023-05-30 MED ORDER — SODIUM CHLORIDE 0.9 % IR SOLN
Status: DC | PRN
  Administered 2023-05-30: 1000 mL

## 2023-05-30 MED ORDER — ACETAMINOPHEN 325 MG PO TABS
650.0000 mg | ORAL_TABLET | Freq: Four times a day (QID) | ORAL | Status: DC
  Administered 2023-05-30 – 2023-05-31 (×6): 650 mg via ORAL
  Filled 2023-05-30 (×7): qty 2

## 2023-05-30 MED ORDER — ONDANSETRON HCL 4 MG/2ML IJ SOLN
INTRAMUSCULAR | Status: AC
  Filled 2023-05-30: qty 8

## 2023-05-30 MED ORDER — CHLORHEXIDINE GLUCONATE 0.12 % MT SOLN
OROMUCOSAL | Status: AC
  Administered 2023-05-30: 15 mL via OROMUCOSAL
  Filled 2023-05-30: qty 15

## 2023-05-30 MED ORDER — LACTATED RINGERS IV SOLN
INTRAVENOUS | Status: AC
Start: 2023-05-30 — End: 2023-05-31

## 2023-05-30 MED ORDER — ONDANSETRON HCL 4 MG/2ML IJ SOLN
INTRAMUSCULAR | Status: DC | PRN
  Administered 2023-05-30: 4 mg via INTRAVENOUS

## 2023-05-30 MED ORDER — DEXAMETHASONE SODIUM PHOSPHATE 10 MG/ML IJ SOLN
INTRAMUSCULAR | Status: DC | PRN
  Administered 2023-05-30: 10 mg via INTRAVENOUS

## 2023-05-30 MED ORDER — SIMETHICONE 80 MG PO CHEW: 80.0000 mg | CHEWABLE_TABLET | Freq: Four times a day (QID) | ORAL | Status: DC | PRN

## 2023-05-30 MED ORDER — FENTANYL CITRATE (PF) 250 MCG/5ML IJ SOLN
INTRAMUSCULAR | Status: AC
  Filled 2023-05-30: qty 5

## 2023-05-30 MED ORDER — PROCHLORPERAZINE EDISYLATE 10 MG/2ML IJ SOLN
10.0000 mg | INTRAMUSCULAR | Status: DC | PRN
  Administered 2023-05-30: 10 mg via INTRAVENOUS
  Filled 2023-05-30: qty 2

## 2023-05-30 MED ORDER — SUCCINYLCHOLINE CHLORIDE 200 MG/10ML IV SOSY
PREFILLED_SYRINGE | INTRAVENOUS | Status: AC
  Filled 2023-05-30: qty 10

## 2023-05-30 MED ORDER — OXYCODONE HCL 5 MG PO TABS: 5.0000 mg | ORAL_TABLET | Freq: Once | ORAL | Status: DC | PRN

## 2023-05-30 MED ORDER — LACTATED RINGERS IV SOLN
INTRAVENOUS | Status: DC
Start: 2023-05-30 — End: 2023-05-30

## 2023-05-30 MED ORDER — ENOXAPARIN SODIUM 40 MG/0.4ML IJ SOSY
40.0000 mg | PREFILLED_SYRINGE | INTRAMUSCULAR | Status: DC
  Filled 2023-05-30 (×2): qty 0.4

## 2023-05-30 MED ORDER — HYDROMORPHONE HCL 1 MG/ML IJ SOLN: 0.5000 mg | INTRAMUSCULAR | Status: DC | PRN

## 2023-05-30 MED ORDER — ONDANSETRON HCL 4 MG/2ML IJ SOLN
4.0000 mg | Freq: Four times a day (QID) | INTRAMUSCULAR | Status: DC | PRN
  Administered 2023-05-30: 4 mg via INTRAVENOUS
  Filled 2023-05-30: qty 2

## 2023-05-30 MED ORDER — MIDAZOLAM HCL 2 MG/2ML IJ SOLN
INTRAMUSCULAR | Status: DC | PRN
  Administered 2023-05-30: 2 mg via INTRAVENOUS

## 2023-05-30 MED ORDER — ROCURONIUM BROMIDE 10 MG/ML (PF) SYRINGE
PREFILLED_SYRINGE | INTRAVENOUS | Status: DC | PRN
  Administered 2023-05-30: 50 mg via INTRAVENOUS
  Administered 2023-05-30: 10 mg via INTRAVENOUS

## 2023-05-30 MED ORDER — FENTANYL CITRATE (PF) 100 MCG/2ML IJ SOLN: 25.0000 ug | INTRAMUSCULAR | Status: DC | PRN

## 2023-05-30 MED ORDER — FENTANYL CITRATE (PF) 250 MCG/5ML IJ SOLN
INTRAMUSCULAR | Status: DC | PRN
  Administered 2023-05-30 (×2): 50 ug via INTRAVENOUS

## 2023-05-30 MED ORDER — 0.9 % SODIUM CHLORIDE (POUR BTL) OPTIME
TOPICAL | Status: DC | PRN
  Administered 2023-05-30: 1000 mL

## 2023-05-30 MED ORDER — SODIUM CHLORIDE 0.9 % IV SOLN
2.0000 g | INTRAVENOUS | Status: AC
  Administered 2023-05-30: 2 g via INTRAVENOUS
  Filled 2023-05-30: qty 20

## 2023-05-30 MED ORDER — BUPIVACAINE-EPINEPHRINE (PF) 0.25% -1:200000 IJ SOLN
INTRAMUSCULAR | Status: AC
  Filled 2023-05-30: qty 30

## 2023-05-30 MED ORDER — SUGAMMADEX SODIUM 200 MG/2ML IV SOLN
INTRAVENOUS | Status: DC | PRN
Start: 2023-05-30 — End: 2023-05-30
  Administered 2023-05-30: 200 mg via INTRAVENOUS

## 2023-05-30 SURGICAL SUPPLY — 49 items
APPLIER CLIP 5 13 M/L LIGAMAX5 (MISCELLANEOUS) ×1 IMPLANT
BAG COUNTER SPONGE SURGICOUNT (BAG) ×1 IMPLANT
BLADE CLIPPER SURG (BLADE) IMPLANT
CANISTER SUCT 3000ML PPV (MISCELLANEOUS) ×1 IMPLANT
CATH URETL OPEN 5X70 (CATHETERS) IMPLANT
CATH URETL OPEN END 6FR 70 (CATHETERS) ×1 IMPLANT
CHLORAPREP W/TINT 26 (MISCELLANEOUS) ×1 IMPLANT
CLIP APPLIE 5 13 M/L LIGAMAX5 (MISCELLANEOUS) ×1 IMPLANT
COVER MAYO STAND STRL (DRAPES) ×1 IMPLANT
COVER SURGICAL LIGHT HANDLE (MISCELLANEOUS) ×1 IMPLANT
DERMABOND ADVANCED .7 DNX12 (GAUZE/BANDAGES/DRESSINGS) ×1 IMPLANT
DRAPE C-ARM 42X120 X-RAY (DRAPES) ×1 IMPLANT
ELECT REM PT RETURN 9FT ADLT (ELECTROSURGICAL) ×1 IMPLANT
ELECTRODE REM PT RTRN 9FT ADLT (ELECTROSURGICAL) ×1 IMPLANT
ENDOLOOP SUT PDS II 0 18 (SUTURE) IMPLANT
GLOVE BIOGEL PI IND STRL 6 (GLOVE) ×1 IMPLANT
GLOVE BIOGEL PI MICRO STRL 5.5 (GLOVE) ×1 IMPLANT
GOWN STRL REUS W/ TWL LRG LVL3 (GOWN DISPOSABLE) ×3 IMPLANT
GRASPER SUT TROCAR 14GX15 (MISCELLANEOUS) IMPLANT
IRRIG SUCT STRYKERFLOW 2 WTIP (MISCELLANEOUS) ×1 IMPLANT
IRRIGATION SUCT STRKRFLW 2 WTP (MISCELLANEOUS) ×1 IMPLANT
KIT BASIN OR (CUSTOM PROCEDURE TRAY) ×1 IMPLANT
KIT TURNOVER KIT B (KITS) ×1 IMPLANT
L-HOOK LAP DISP 36CM (ELECTROSURGICAL) ×1 IMPLANT
LHOOK LAP DISP 36CM (ELECTROSURGICAL) ×1 IMPLANT
NDL INSUFFLATION 14GA 120MM (NEEDLE) IMPLANT
NEEDLE INSUFFLATION 14GA 120MM (NEEDLE) ×1 IMPLANT
NS IRRIG 1000ML POUR BTL (IV SOLUTION) ×1 IMPLANT
PAD ARMBOARD 7.5X6 YLW CONV (MISCELLANEOUS) ×1 IMPLANT
PENCIL BUTTON HOLSTER BLD 10FT (ELECTRODE) ×1 IMPLANT
POUCH RETRIEVAL ECOSAC 10 (ENDOMECHANICALS) IMPLANT
SCISSORS LAP 5X35 DISP (ENDOMECHANICALS) ×1 IMPLANT
SET CHOLANGIOGRAPH 5 50 .035 (SET/KITS/TRAYS/PACK) IMPLANT
SET TUBE SMOKE EVAC HIGH FLOW (TUBING) ×1 IMPLANT
SLEEVE Z-THREAD 5X100MM (TROCAR) ×2 IMPLANT
SPECIMEN JAR SMALL (MISCELLANEOUS) ×1 IMPLANT
STOPCOCK 4 WAY LG BORE MALE ST (IV SETS) IMPLANT
SUT MNCRL AB 4-0 PS2 18 (SUTURE) ×1 IMPLANT
SUT VICRYL 0 UR6 27IN ABS (SUTURE) ×1 IMPLANT
SYS BAG RETRIEVAL 10MM (BASKET) IMPLANT
SYSTEM BAG RETRIEVAL 10MM (BASKET) IMPLANT
TOWEL GREEN STERILE (TOWEL DISPOSABLE) ×1 IMPLANT
TOWEL GREEN STERILE FF (TOWEL DISPOSABLE) ×1 IMPLANT
TRAY LAPAROSCOPIC MC (CUSTOM PROCEDURE TRAY) ×1 IMPLANT
TROCAR BALLN 12MMX100 BLUNT (TROCAR) ×1 IMPLANT
TROCAR Z THREAD OPTICAL 12X100 (TROCAR) IMPLANT
TROCAR Z-THREAD OPTICAL 5X100M (TROCAR) ×1 IMPLANT
WARMER LAPAROSCOPE (MISCELLANEOUS) ×1 IMPLANT
WATER STERILE IRR 1000ML POUR (IV SOLUTION) ×1 IMPLANT

## 2023-05-30 NOTE — Discharge Instructions (Signed)

## 2023-05-30 NOTE — Anesthesia Procedure Notes (Signed)
 Procedure Name: Intubation Date/Time: 05/30/2023 1:13 PM  Performed by: Alease Medina, CRNAPre-anesthesia Checklist: Patient identified, Emergency Drugs available, Suction available and Patient being monitored Patient Re-evaluated:Patient Re-evaluated prior to induction Oxygen Delivery Method: Circle system utilized Preoxygenation: Pre-oxygenation with 100% oxygen Induction Type: IV induction Ventilation: Mask ventilation without difficulty Laryngoscope Size: Mac and 4 Grade View: Grade I Tube type: Oral Tube size: 7.0 mm Number of attempts: 1 Airway Equipment and Method: Stylet and Oral airway Placement Confirmation: ETT inserted through vocal cords under direct vision, positive ETCO2 and breath sounds checked- equal and bilateral Secured at: 21 cm Tube secured with: Tape Dental Injury: Teeth and Oropharynx as per pre-operative assessment

## 2023-05-30 NOTE — Anesthesia Preprocedure Evaluation (Addendum)
 Anesthesia Evaluation  Patient identified by MRN, date of birth, ID band Patient awake    Reviewed: Allergy & Precautions, H&P , NPO status , Patient's Chart, lab work & pertinent test results  Airway Mallampati: II   Neck ROM: full    Dental   Pulmonary neg pulmonary ROS   breath sounds clear to auscultation       Cardiovascular negative cardio ROS  Rhythm:regular Rate:Normal     Neuro/Psych  Headaches PSYCHIATRIC DISORDERS Anxiety        GI/Hepatic Acute cholecystitis   Endo/Other    Renal/GU      Musculoskeletal   Abdominal   Peds  Hematology Lab Results      Component                Value               Date                      WBC                      16.0 (H)            05/30/2023                HGB                      13.3                05/30/2023                HCT                      38.5                05/30/2023                MCV                      87.9                05/30/2023                PLT                      308                 05/30/2023              Anesthesia Other Findings   Reproductive/Obstetrics                             Anesthesia Physical Anesthesia Plan  ASA: 2  Anesthesia Plan: General   Post-op Pain Management:    Induction: Intravenous  PONV Risk Score and Plan: 3 and Ondansetron, Dexamethasone and Treatment may vary due to age or medical condition  Airway Management Planned: Oral ETT  Additional Equipment:   Intra-op Plan:   Post-operative Plan: Extubation in OR  Informed Consent: I have reviewed the patients History and Physical, chart, labs and discussed the procedure including the risks, benefits and alternatives for the proposed anesthesia with the patient or authorized representative who has indicated his/her understanding and acceptance.     Dental advisory given  Plan Discussed with: CRNA, Anesthesiologist and  Surgeon  Anesthesia Plan Comments: (Risks of general anesthesia discussed  including, but not limited to, sore throat, hoarse voice, chipped/damaged teeth, injury to vocal cords, nausea and vomiting, allergic reactions, lung infection, heart attack, stroke, and death. All questions answered. )        Anesthesia Quick Evaluation

## 2023-05-30 NOTE — Op Note (Signed)
 Date: 05/30/23  Patient: [name] MRN: [id number]  Preoperative Diagnosis: Cholelithiasis, possible cholecystitis Postoperative Diagnosis: Acute calculous cholecystitis  Procedure: Laparoscopic cholecystectomy with intraoperative cholangiogram  Surgeon: Sophronia Simas, MD  EBL: 50 mL  Anesthesia: General endotracheal  Specimens: Gallbladder  Indications: [name] is a 38 yo female who presented with worsening RUQ abdominal pain over the last few days. Labs were significant for a leukocytosis, and imaging showed cholelithiasis with mild CBD dilation. LFTs were normal. After a discussion of the risks and benefits of surgery, she agreed to proceed with cholecystectomy.  Findings: Acute cholecystitis. Mild dilation of the extrahepatic bile ducts, but otherwise normal cholangiogram with no filling defects or strictures.  Procedure details: Informed consent was obtained in the preoperative area prior to the procedure. The patient was brought to the operating room and placed on the table in the supine position. General anesthesia was induced and appropriate lines and drains were placed for intraoperative monitoring. Perioperative antibiotics were administered per SCIP guidelines. The abdomen was prepped and draped in the usual sterile fashion. A pre-procedure timeout was taken verifying patient identity, surgical site and procedure to be performed.  A small supraumbilical skin incision was made, the subcutaneous tissue was bluntly spread, and the umbilical stalk was grasped and elevated. A Veress needle was inserted through the fascia, intraperitoneal placement was confirmed with the saline drop test, and the abdomen was insufflated. A 5mm Visiport was placed and the peritoneal cavity was inspected with no evidence of visceral or vascular injury. A 5mm subxiphoid port was placed, followed by a 5mm port in the right costal margin. The umbilical port was then upsized to a 12mm port, and a 5mm  port was placed in the right lateral costal margin. The gallbladder was distended, erythematous and edematous, consistent with acute cholecystitis. A needle decompression of the gallbladder was performed to allow retractions. The fundus of the gallbladder was grasped and retracted cephalad. There were omental adhesions to the gallbladder, which were taken down bluntly. The infundibulum was retracted laterally. The cystic triangle was dissected out using cautery and blunt dissection, and the critical view of safety was obtained. The cystic artery was clipped and divided. The cystic duct was clipped near the gallbladder, and a ductotomy was made on the cystic duct. An Olsen clamp was used to cannulate the cystic duct with a 5-Fr ureteral catheter. A cholangiogram was then performed using fluoroscopy. There was filling of the entire common bile duct, common hepatic duct, and left and right hepatic ducts. The extrahepatic bile ducts were mildly dilated, but there were no filling defects or strictures, and there was prompt passage of contrast into the duodenum. The cholangiocatheter was removed, and the cystic duct was clipped and divided at the site of the ductotomy. A PDS endoloop was also placed on the cystic duct stump. The gallbladder was taken off the liver using cautery. There was significant gallbladder wall edema. The specimen was placed in an Ecosac. The surgical site was irrigated with saline until the effluent was clear. Hemostasis was achieved in the gallbladder fossa using cautery. The cystic duct and artery stumps were visually inspected and there was no evidence of bile leak or bleeding. The ports were removed under direct visualization and the abdomen was desufflated. The specimen was extracted via the umbilical port site and sent for routine pathology. The umbilical port site fascia was closed with a 0 vicryl figure-of-eight suture. The skin at all port sites was closed with 4-0 monocryl subcuticular  suture.  Dermabond was applied.  The patient tolerated the procedure well with no apparent complications. All counts were correct x2 at the end of the procedure. The patient was extubated and taken to PACU in stable condition.  Sophronia Simas, MD 05/30/23 2:32 PM

## 2023-05-30 NOTE — Plan of Care (Signed)

## 2023-05-30 NOTE — Transfer of Care (Signed)
 Immediate Anesthesia Transfer of Care Note  Patient: [name]  Procedure(s) Performed: LAPAROSCOPIC CHOLECYSTECTOMY WITH INTRAOPERATIVE CHOLANGIOGRAM (Abdomen)  Patient Location: PACU  Anesthesia Type:General  Level of Consciousness: drowsy and patient cooperative  Airway & Oxygen Therapy: Patient Spontanous Breathing  Post-op Assessment: Report given to RN, Post -op Vital signs reviewed and stable, and Patient moving all extremities X 4  Post vital signs: Reviewed and stable  Last Vitals:  Vitals Value Taken Time  BP 157/97 05/30/23 1438  Temp    Pulse 76 05/30/23 1439  Resp 14   SpO2 93 % 05/30/23 1439  Vitals shown include unfiled device data.  Last Pain:  Vitals:   05/30/23 1241  TempSrc:   PainSc: 0-No pain      Patients Stated Pain Goal: 2 (05/30/23 1241)  Complications: No notable events documented.

## 2023-05-30 NOTE — Progress Notes (Signed)
 [facility] Progress Note     Subjective: CC:  Denies pain at present. Confirms history of RUQ pain that radiated to her back, waking her up from her sleep, on Sunday. It lasted 2 hours. The pain recurred on Monday and lasted longer. Yesterday the pain was constant and did not let up, assocaited with nausea/vomiting, so she came in. She denies a history of GI issues, gastric ulcers, or a known family history of intestinal or biliary cancer. Reports a family history of gallbladder disease and says multiple family members have had their gallbladder removed. Her husband is at the bedside. Surgical history c-section x2 and tubal ligation.  Objective: Vital signs in last 24 hours: Temp:  [98 F (36.7 C)-98.7 F (37.1 C)] 98.7 F (37.1 C) (03/12 0821) Pulse Rate:  [58-69] 69 (03/12 0815) Resp:  [16-18] 16 (03/12 0815) BP: (119-161)/(78-98) 119/82 (03/12 0815) SpO2:  [96 %-100 %] 96 % (03/12 0815) Weight:  [97.1 kg] 97.1 kg (03/11 1633)    Intake/Output from previous day: No intake/output data recorded. Intake/Output this shift: No intake/output data recorded.  PE: Gen:  Alert, NAD, pleasant Card:  Regular rate and rhythm, pedal pulses 2+ BL Pulm:  Normal effort, clear to auscultation bilaterally Abd: Soft, non-tender, non-distended, no hernias or masses Skin: warm and dry, no rashes  Psych: A&Ox3   Lab Results:  Recent Labs    05/29/23 1654 05/29/23 1657 05/30/23 0449  WBC 14.6*  --  16.0*  HGB 14.1 13.6 13.3  HCT 41.0 40.0 38.5  PLT 326  --  308   BMET Recent Labs    05/29/23 1654 05/29/23 1657 05/30/23 0449  NA 136 138 136  K 3.9 3.9 3.9  CL 102 102 102  CO2 23  --  27  GLUCOSE 146* 141* 113*  BUN 6 7 8   CREATININE 0.98 0.80 0.98  CALCIUM 10.5*  --  9.4   PT/INR No results for input(s): "LABPROT", "INR" in the last 72 hours. CMP     Component Value Date/Time   NA 136 05/30/2023 0449   NA 137 05/15/2018 1044   K 3.9 05/30/2023 0449   CL 102  05/30/2023 0449   CO2 27 05/30/2023 0449   GLUCOSE 113 (H) 05/30/2023 0449   BUN 8 05/30/2023 0449   BUN 17 05/15/2018 1044   CREATININE 0.98 05/30/2023 0449   CREATININE 0.78 08/25/2015 0947   CALCIUM 9.4 05/30/2023 0449   PROT 7.4 05/30/2023 0449   PROT 7.6 05/15/2018 1044   ALBUMIN 3.8 05/30/2023 0449   ALBUMIN 5.0 (H) 05/15/2018 1044   AST 18 05/30/2023 0449   ALT 20 05/30/2023 0449   ALKPHOS 78 05/30/2023 0449   BILITOT 0.9 05/30/2023 0449   BILITOT 0.4 05/15/2018 1044   GFRNONAA >60 05/30/2023 0449   GFRNONAA >89 08/25/2015 0947   GFRAA 117 05/15/2018 1044   GFRAA >89 08/25/2015 0947   Lipase     Component Value Date/Time   LIPASE 22 05/29/2023 1654       Studies/Results: CT ABDOMEN PELVIS W CONTRAST Result Date: 05/29/2023 CLINICAL DATA:  Abdominal/flank pain, stone suspected RUQ abdominal pain radiating to R flank, US shows sludge however no cholecystitis, assess for stones vs pyelo EXAM: CT ABDOMEN AND PELVIS WITH CONTRAST TECHNIQUE: Multidetector CT imaging of the abdomen and pelvis was performed using the standard protocol following bolus administration of intravenous contrast. RADIATION DOSE REDUCTION: This exam was performed according to the departmental dose-optimization program which includes automated exposure control, adjustment  of the mA and/or kV according to patient size and/or use of iterative reconstruction technique. CONTRAST:  75mL OMNIPAQUE IOHEXOL 350 MG/ML SOLN COMPARISON:  Ultrasound abdomen 05/29/2023 FINDINGS: Lower chest: No acute abnormality.  Tiny hiatal hernia. Hepatobiliary: The liver is enlarged measuring up to 22 cm. No focal liver abnormality. Calcified gallstone noted within the gallbladder lumen. No gallbladder wall thickening or pericholecystic fluid. Dilated common bile duct measuring up to 12 mm. No CT evidence of choledocholithiasis. No intrahepatic biliary ductal dilatation. Pancreas: No focal lesion. Normal pancreatic contour. No  surrounding inflammatory changes. No main pancreatic ductal dilatation. Spleen: Normal in size without focal abnormality. Adrenals/Urinary Tract: No adrenal nodule bilaterally. Bilateral kidneys enhance symmetrically. Fluid density lesions likely represent simple renal cysts. Simple renal cysts, in the absence of clinically indicated signs/symptoms, require no independent follow-up. No hydronephrosis. No hydroureter. Punctate nephrolithiasis on the right. No left nephrolithiasis. No ureterolithiasis bilaterally. The urinary bladder is unremarkable. Stomach/Bowel: Stomach is within normal limits. No evidence of bowel wall thickening or dilatation. Appendix appears normal. Vascular/Lymphatic: No abdominal aorta or iliac aneurysm. No abdominal, pelvic, or inguinal lymphadenopathy. Reproductive: Uterus and bilateral adnexa are unremarkable. Other: No intraperitoneal free fluid. No intraperitoneal free gas. No organized fluid collection. Musculoskeletal: Tiny fat containing umbilical hernia. No suspicious lytic or blastic osseous lesions. No acute displaced fracture. IMPRESSION: 1. Tiny hiatal hernia. 2. Cholelithiasis no CT evidence of acute cholecystitis. Dilated common bile duct measuring up to 12 mm with no CT evidence of choledocholithiasis. No intrahepatic biliary ductal dilatation. 3. Nonobstructive punctate right nephrolithiasis. 4. Hepatomegaly. Electronically Signed   By: Tish Frederickson M.D.   On: 05/29/2023 23:41   US Abdomen Limited RUQ (LIVER/GB) Result Date: 05/29/2023 CLINICAL DATA:  Right upper quadrant pain EXAM: ULTRASOUND ABDOMEN LIMITED RIGHT UPPER QUADRANT COMPARISON:  None Available. FINDINGS: Gallbladder: There is a 1.4 cm echogenic mass in the gallbladder lumen, potentially focal sludge. No gallbladder wall thickening or pericholecystic fluid. Negative sonographic Murphy's sign. Common bile duct: Diameter: 9 mm, prominent Liver: No focal lesion identified. Within normal limits in parenchymal  echogenicity. Portal vein is patent on color Doppler imaging with normal direction of blood flow towards the liver. Other: Incidentally identified renal cysts measuring up to 1.4 cm. IMPRESSION: 1. There is a 1.4 cm echogenic mass in the gallbladder lumen, potentially focal sludge. Recommend follow-up right upper quadrant ultrasound in 6 months to assess for stability. 2. Common bile duct is prominent measuring 9 mm. Recommend correlation with LFTs. If there is concern for biliary obstruction, recommend MRI/MRCP. 3. No evidence for acute cholecystitis. Electronically Signed   By: Annia Belt M.D.   On: 05/29/2023 18:50    Anti-infectives: Anti-infectives (From admission, onward)    None        Assessment/Plan 38 y/o F with RUQ abdominal pain that radiates to her back associated with nausea/vomiting  CT and RUQ U/S show cholelithiasis without evidence of cholecystitis. Common bile duct is dilated at 9-12 mm. Afebrile, WBC 16, LFTs and lipase WNL.  Based on history, labs, and imaging I do suspect she has symptomatic cholelithiasis, possible cholecystitis.  Recommend laparoscopic cholecystectomy with IOC today   The operative and non-operative management of cholecystitis, symptomatic cholelithiasis was discussed with the patient. Risks of surgery including bleeding, infection, damage to surrounding structures, conversion to open, drain placement, need for additional procedures, prolonged hospital stay, as well as the risks of general anesthesia were discussed with the patient and she would like to proceed with surgery. Questions were welcomed  and answered. We did discuss what would happen if IOC showed choledocholithiasis - GI Consult/endoscopy.   Rocephin on call to OR NPO, IVF @ 75 mL/hr Consent ordered  VTE: Lovenox     LOS: 0 days   I reviewed nursing notes, ED provider notes, last 24 h vitals and pain scores, last 48 h intake and output, last 24 h labs and trends, and last 24 h  imaging results.  This care required high  level of medical decision making.   Hosie Spangle, PA-C [facility] Please see Amion for pager number during day hours 7:00am-4:30pm

## 2023-05-31 ENCOUNTER — Encounter (HOSPITAL_COMMUNITY): Payer: Self-pay | Admitting: Surgery

## 2023-05-31 LAB — HEPATIC FUNCTION PANEL
ALT: 46 U/L — ABNORMAL HIGH (ref 0–44)
AST: 37 U/L (ref 15–41)
Albumin: 3.5 g/dL (ref 3.5–5.0)
Alkaline Phosphatase: 76 U/L (ref 38–126)
Bilirubin, Direct: 0.1 mg/dL (ref 0.0–0.2)
Indirect Bilirubin: 0.5 mg/dL (ref 0.3–0.9)
Total Bilirubin: 0.6 mg/dL (ref 0.0–1.2)
Total Protein: 7.3 g/dL (ref 6.5–8.1)

## 2023-05-31 MED ORDER — OXYCODONE HCL 5 MG PO TABS: 5.0000 mg | ORAL_TABLET | Freq: Four times a day (QID) | ORAL | 0 refills | Status: DC | PRN

## 2023-05-31 NOTE — Plan of Care (Signed)
  Problem: Education: Goal: Knowledge of General Education information will improve Description: Including pain rating scale, medication(s)/side effects and non-pharmacologic comfort measures Outcome: Progressing   Problem: Health Behavior/Discharge Planning: Goal: Ability to manage health-related needs will improve Outcome: Progressing   Problem: Clinical Measurements: Goal: Ability to maintain clinical measurements within normal limits will improve Outcome: Progressing Goal: Will remain free from infection Outcome: Progressing Goal: Diagnostic test results will improve Outcome: Progressing Goal: Respiratory complications will improve Outcome: Progressing Goal: Cardiovascular complication will be avoided Outcome: Completed/Met   Problem: Activity: Goal: Risk for activity intolerance will decrease Outcome: Completed/Met   Problem: Nutrition: Goal: Adequate nutrition will be maintained Outcome: Completed/Met   Problem: Coping: Goal: Level of anxiety will decrease Outcome: Completed/Met   Problem: Elimination: Goal: Will not experience complications related to bowel motility Outcome: Completed/Met Goal: Will not experience complications related to urinary retention Outcome: Completed/Met   Problem: Pain Managment: Goal: General experience of comfort will improve and/or be controlled Outcome: Progressing   Problem: Safety: Goal: Ability to remain free from injury will improve Outcome: Progressing   Problem: Skin Integrity: Goal: Risk for impaired skin integrity will decrease Outcome: Progressing

## 2023-05-31 NOTE — Plan of Care (Signed)
  Problem: Education: Goal: Knowledge of General Education information will improve Description: Including pain rating scale, medication(s)/side effects and non-pharmacologic comfort measures Outcome: Completed/Met   Problem: Health Behavior/Discharge Planning: Goal: Ability to manage health-related needs will improve Outcome: Completed/Met   Problem: Clinical Measurements: Goal: Ability to maintain clinical measurements within normal limits will improve Outcome: Completed/Met Goal: Will remain free from infection Outcome: Completed/Met Goal: Diagnostic test results will improve Outcome: Completed/Met Goal: Respiratory complications will improve Outcome: Completed/Met   Problem: Pain Managment: Goal: General experience of comfort will improve and/or be controlled Outcome: Completed/Met   Problem: Safety: Goal: Ability to remain free from injury will improve Outcome: Completed/Met   Problem: Skin Integrity: Goal: Risk for impaired skin integrity will decrease Outcome: Completed/Met

## 2023-05-31 NOTE — Discharge Summary (Signed)
 [facility] Discharge Summary   Patient ID: [name] MRN: [id number] DOB/AGE: October 06, 1985 37 y.o.  Admit date: 05/29/2023 Discharge date: 05/31/2023  Admitting Diagnosis: Symptomatic Cholelithiasis  Discharge Diagnosis Acute cholecystitis  S/P laparoscopic cholecystectomy  Consultants None   Imaging: DG Cholangiogram Operative Result Date: 05/30/2023 CLINICAL DATA:  Cholecystectomy. EXAM: INTRAOPERATIVE CHOLANGIOGRAM TECHNIQUE: Cholangiographic images from the C-arm fluoroscopic device were submitted for interpretation post-operatively. Please see the procedural report for the amount of contrast and the fluoroscopy time utilized. FLUOROSCOPY: Radiation Exposure Index (as provided by the fluoroscopic device): 6.2 mGy COMPARISON:  CT and ultrasound studies on 05/29/2023 FINDINGS: Intraoperative imaging demonstrates mild tapered dilatation of the common bile duct more affecting the proximal to mid segments as seen by prior imaging. No discrete filling defects identified within the common bile duct. Contrast enters the duodenum normally. There is mild reflux of contrast into the pancreatic duct. IMPRESSION: Mildly dilated common bile duct. No discrete filling defects are identified in the CBD. Electronically Signed   By: Irish Lack M.D.   On: 05/30/2023 16:00   CT ABDOMEN PELVIS W CONTRAST Result Date: 05/29/2023 CLINICAL DATA:  Abdominal/flank pain, stone suspected RUQ abdominal pain radiating to R flank, US shows sludge however no cholecystitis, assess for stones vs pyelo EXAM: CT ABDOMEN AND PELVIS WITH CONTRAST TECHNIQUE: Multidetector CT imaging of the abdomen and pelvis was performed using the standard protocol following bolus administration of intravenous contrast. RADIATION DOSE REDUCTION: This exam was performed according to the departmental dose-optimization program which includes automated exposure control, adjustment of the mA and/or kV according to patient size  and/or use of iterative reconstruction technique. CONTRAST:  75mL OMNIPAQUE IOHEXOL 350 MG/ML SOLN COMPARISON:  Ultrasound abdomen 05/29/2023 FINDINGS: Lower chest: No acute abnormality.  Tiny hiatal hernia. Hepatobiliary: The liver is enlarged measuring up to 22 cm. No focal liver abnormality. Calcified gallstone noted within the gallbladder lumen. No gallbladder wall thickening or pericholecystic fluid. Dilated common bile duct measuring up to 12 mm. No CT evidence of choledocholithiasis. No intrahepatic biliary ductal dilatation. Pancreas: No focal lesion. Normal pancreatic contour. No surrounding inflammatory changes. No main pancreatic ductal dilatation. Spleen: Normal in size without focal abnormality. Adrenals/Urinary Tract: No adrenal nodule bilaterally. Bilateral kidneys enhance symmetrically. Fluid density lesions likely represent simple renal cysts. Simple renal cysts, in the absence of clinically indicated signs/symptoms, require no independent follow-up. No hydronephrosis. No hydroureter. Punctate nephrolithiasis on the right. No left nephrolithiasis. No ureterolithiasis bilaterally. The urinary bladder is unremarkable. Stomach/Bowel: Stomach is within normal limits. No evidence of bowel wall thickening or dilatation. Appendix appears normal. Vascular/Lymphatic: No abdominal aorta or iliac aneurysm. No abdominal, pelvic, or inguinal lymphadenopathy. Reproductive: Uterus and bilateral adnexa are unremarkable. Other: No intraperitoneal free fluid. No intraperitoneal free gas. No organized fluid collection. Musculoskeletal: Tiny fat containing umbilical hernia. No suspicious lytic or blastic osseous lesions. No acute displaced fracture. IMPRESSION: 1. Tiny hiatal hernia. 2. Cholelithiasis no CT evidence of acute cholecystitis. Dilated common bile duct measuring up to 12 mm with no CT evidence of choledocholithiasis. No intrahepatic biliary ductal dilatation. 3. Nonobstructive punctate right  nephrolithiasis. 4. Hepatomegaly. Electronically Signed   By: Tish Frederickson M.D.   On: 05/29/2023 23:41   US Abdomen Limited RUQ (LIVER/GB) Result Date: 05/29/2023 CLINICAL DATA:  Right upper quadrant pain EXAM: ULTRASOUND ABDOMEN LIMITED RIGHT UPPER QUADRANT COMPARISON:  None Available. FINDINGS: Gallbladder: There is a 1.4 cm echogenic mass in the gallbladder lumen, potentially focal sludge. No gallbladder wall thickening or pericholecystic fluid.  Negative sonographic Murphy's sign. Common bile duct: Diameter: 9 mm, prominent Liver: No focal lesion identified. Within normal limits in parenchymal echogenicity. Portal vein is patent on color Doppler imaging with normal direction of blood flow towards the liver. Other: Incidentally identified renal cysts measuring up to 1.4 cm. IMPRESSION: 1. There is a 1.4 cm echogenic mass in the gallbladder lumen, potentially focal sludge. Recommend follow-up right upper quadrant ultrasound in 6 months to assess for stability. 2. Common bile duct is prominent measuring 9 mm. Recommend correlation with LFTs. If there is concern for biliary obstruction, recommend MRI/MRCP. 3. No evidence for acute cholecystitis. Electronically Signed   By: Annia Belt M.D.   On: 05/29/2023 18:50    Procedures Dr. Sophronia Simas (05/30/23) - Laparoscopic Cholecystectomy with [facility]   Hospital Course:  Patient is a 38 year old female who presented to the ED with abdominal pain.  Workup showed cholelithiasis.  Patient was admitted and underwent procedure listed above.  Tolerated procedure well and was transferred to the floor.  Diet was advanced as tolerated.  On POD1, the patient was voiding well, tolerating diet, ambulating well, pain well controlled, vital signs stable, incisions c/d/i and felt stable for discharge home.  Patient will follow up in our office in 3-4 weeks and knows to call with questions or concerns. She will call to confirm appointment date/time.    Physical Exam: General:   Alert, NAD, pleasant, comfortable Abd:  Soft, ND, mild tenderness, incisions C/D/I   I or a member of my team have reviewed this patient in the Controlled Substance Database.   Allergies as of 05/31/2023   No Known Allergies      Medication List     TAKE these medications    acetaminophen 500 MG tablet Commonly known as: TYLENOL Take 1,000 mg by mouth as needed for mild pain (pain score 1-3), headache or fever.   Biotin 5000 MCG Caps Take 3 capsules by mouth daily.   ibuprofen 200 MG tablet Commonly known as: ADVIL Take 600 mg by mouth every 6 (six) hours as needed for headache or mild pain (pain score 1-3).   multivitamin with minerals Tabs tablet Take 1 tablet by mouth daily.   oxyCODONE 5 MG immediate release tablet Commonly known as: Oxy IR/ROXICODONE Take 1 tablet (5 mg total) by mouth every 6 (six) hours as needed for moderate pain (pain score 4-6).   True Magnesium Oxide 500 MG Tabs Generic drug: Magnesium Oxide -Mg Supplement Take 2 tablets by mouth daily.          Follow-up Information     [facility]zis, Hedda Slade, [place]. Go on 06/28/2023.   Specialty: General Surgery Why: 3 PM, please arrive 30 min prior to appointment time to check in. Contact information: [address] [place] [address] [facility] [place] [address] [phone]                 Signed: Juliet Rude , [facility] 05/31/2023, 8:47 AM Please see Amion for pager number during day hours 7:00am-4:30pm

## 2023-05-31 NOTE — Anesthesia Postprocedure Evaluation (Signed)
 Anesthesia Post Note  Patient: [name]  Procedure(s) Performed: LAPAROSCOPIC CHOLECYSTECTOMY WITH INTRAOPERATIVE CHOLANGIOGRAM (Abdomen)     Patient location during evaluation: PACU Anesthesia Type: General Level of consciousness: awake and alert Pain management: pain level controlled Vital Signs Assessment: post-procedure vital signs reviewed and stable Respiratory status: spontaneous breathing, nonlabored ventilation, respiratory function stable and patient connected to nasal cannula oxygen Cardiovascular status: blood pressure returned to baseline and stable Postop Assessment: no apparent nausea or vomiting Anesthetic complications: no   No notable events documented.  Last Vitals:  Vitals:   05/31/23 0346 05/31/23 0748  BP: 131/84 121/82  Pulse: 72 65  Resp:  18  Temp: 36.6 C 36.7 C  SpO2: 100% 98%    Last Pain:  Vitals:   05/31/23 0748  TempSrc:   PainSc: 0-No pain                 Jeri Rawlins S

## 2023-06-01 LAB — SURGICAL PATHOLOGY

## 2023-06-06 ENCOUNTER — Ambulatory Visit: Payer: Self-pay | Admitting: Family

## 2023-06-28 NOTE — Progress Notes (Unsigned)
 New Patient Office Visit  Subjective    Patient ID: [name], female    DOB: [birth year]  Age: 38 y.o. MRN: [id number]  CC: No chief complaint on file.   HPI [name] is a 38 y.o. female presents to establish care.   Last PCP/physical/labs: Osvaldo Angst PA ***  Outpatient Encounter Medications as of 06/29/2023  Medication Sig   acetaminophen (TYLENOL) 500 MG tablet Take 1,000 mg by mouth as needed for mild pain (pain score 1-3), headache or fever.   Biotin 5000 MCG CAPS Take 3 capsules by mouth daily.   ibuprofen (ADVIL) 200 MG tablet Take 600 mg by mouth every 6 (six) hours as needed for headache or mild pain (pain score 1-3).   Magnesium Oxide -Mg Supplement (TRUE MAGNESIUM OXIDE) 500 MG TABS Take 2 tablets by mouth daily.   Multiple Vitamin (MULTIVITAMIN WITH MINERALS) TABS tablet Take 1 tablet by mouth daily.   oxyCODONE (OXY IR/ROXICODONE) 5 MG immediate release tablet Take 1 tablet (5 mg total) by mouth every 6 (six) hours as needed for moderate pain (pain score 4-6).   No facility-administered encounter medications on file as of 06/29/2023.    Past Medical History:  Diagnosis Date   Abnormal Pap smear    Migraine    Rh negative state in antepartum period     Past Surgical History:  Procedure Laterality Date   CESAREAN SECTION  10/07/2011   Procedure: CESAREAN SECTION;  Surgeon: Willodean Rosenthal, MD;  Location: [facility];  Service: Obstetrics;  Laterality: N/A;   CESAREAN SECTION WITH BILATERAL TUBAL LIGATION N/A 12/19/2012   Procedure: CESAREAN SECTION WITH BILATERAL TUBAL LIGATION;  Surgeon: Catalina Antigua, MD;  Location: [facility];  Service: Obstetrics;  Laterality: N/A;   CHOLECYSTECTOMY N/A 05/30/2023   Procedure: LAPAROSCOPIC CHOLECYSTECTOMY WITH INTRAOPERATIVE CHOLANGIOGRAM;  Surgeon: Fritzi Mandes, MD;  Location: [facility];  Service: General;  Laterality: N/A;   DILATION AND CURETTAGE OF UTERUS     TONSILLECTOMY     38 yrs old    Family History   Problem Relation Age of Onset   Diabetes Maternal Grandmother    Diabetes Maternal Grandfather    Depression Mother    Anxiety disorder Mother    Alcohol abuse Father    Depression Father    COPD Father    Anesthesia problems Neg Hx    Hypotension Neg Hx    Malignant hyperthermia Neg Hx    Pseudochol deficiency Neg Hx     Social History   Socioeconomic History   Marital status: Married    Spouse name: Not on file   Number of children: Not on file   Years of education: Not on file   Highest education level: Not on file  Occupational History   Not on file  Tobacco Use   Smoking status: Never   Smokeless tobacco: Never  Substance and Sexual Activity   Alcohol use: No   Drug use: No   Sexual activity: Yes    Partners: Male    Birth control/protection: I.U.D.  Other Topics Concern   Not on file  Social History Narrative   Not on file   Social Drivers of Health   Financial Resource Strain: Not on file  Food Insecurity: No Food Insecurity (05/30/2023)   Hunger Vital Sign    Worried About Running Out of Food in the Last Year: Never true    Ran Out of Food in the Last Year: Never true  Transportation Needs: No  Transportation Needs (05/30/2023)   PRAPARE - Administrator, Civil Service (Medical): No    Lack of Transportation (Non-Medical): No  Physical Activity: Not on file  Stress: Not on file  Social Connections: Moderately Isolated (05/30/2023)   Social Connection and Isolation Panel [NHANES]    Frequency of Communication with Friends and Family: Once a week    Frequency of Social Gatherings with Friends and Family: Once a week    Attends Religious Services: More than 4 times per year    Active Member of [employer] or Organizations: No    Attends Banker Meetings: Never    Marital Status: Married  Catering manager Violence: Not At Risk (05/30/2023)   Humiliation, Afraid, Rape, and Kick questionnaire    Fear of Current or Ex-Partner: No     Emotionally Abused: No    Physically Abused: No    Sexually Abused: No    ROS      Objective    LMP 05/25/2023   Physical Exam  {Labs (Optional):23779}    Assessment & Plan:  Establishing care with new doctor, encounter for     No follow-ups on file.   [place]esto Charon, NP

## 2023-06-29 ENCOUNTER — Ambulatory Visit: Payer: Self-pay | Admitting: General Practice

## 2023-06-29 ENCOUNTER — Encounter: Payer: Self-pay | Admitting: General Practice

## 2023-06-29 VITALS — BP 140/96 | HR 69 | Temp 98.0°F | Ht 66.5 in | Wt 217.0 lb

## 2023-06-29 DIAGNOSIS — E6609 Other obesity due to excess calories: Secondary | ICD-10-CM

## 2023-06-29 DIAGNOSIS — Z7689 Persons encountering health services in other specified circumstances: Secondary | ICD-10-CM | POA: Insufficient documentation

## 2023-06-29 DIAGNOSIS — R7401 Elevation of levels of liver transaminase levels: Secondary | ICD-10-CM

## 2023-06-29 DIAGNOSIS — Z6834 Body mass index (BMI) 34.0-34.9, adult: Secondary | ICD-10-CM | POA: Diagnosis not present

## 2023-06-29 DIAGNOSIS — Z0001 Encounter for general adult medical examination with abnormal findings: Secondary | ICD-10-CM

## 2023-06-29 DIAGNOSIS — R030 Elevated blood-pressure reading, without diagnosis of hypertension: Secondary | ICD-10-CM | POA: Insufficient documentation

## 2023-06-29 DIAGNOSIS — Z1159 Encounter for screening for other viral diseases: Secondary | ICD-10-CM

## 2023-06-29 DIAGNOSIS — Z23 Encounter for immunization: Secondary | ICD-10-CM

## 2023-06-29 DIAGNOSIS — Z131 Encounter for screening for diabetes mellitus: Secondary | ICD-10-CM

## 2023-06-29 DIAGNOSIS — E66811 Obesity, class 1: Secondary | ICD-10-CM

## 2023-06-29 DIAGNOSIS — Z0000 Encounter for general adult medical examination without abnormal findings: Secondary | ICD-10-CM | POA: Insufficient documentation

## 2023-06-29 DIAGNOSIS — E669 Obesity, unspecified: Secondary | ICD-10-CM | POA: Insufficient documentation

## 2023-06-29 LAB — COMPREHENSIVE METABOLIC PANEL WITH GFR
ALT: 19 U/L (ref 0–35)
AST: 16 U/L (ref 0–37)
Albumin: 4.8 g/dL (ref 3.5–5.2)
Alkaline Phosphatase: 113 U/L (ref 39–117)
BUN: 7 mg/dL (ref 6–23)
CO2: 29 meq/L (ref 19–32)
Calcium: 9.8 mg/dL (ref 8.4–10.5)
Chloride: 103 meq/L (ref 96–112)
Creatinine, Ser: 0.79 mg/dL (ref 0.40–1.20)
GFR: 96.08 mL/min (ref >60.00)
Glucose, Bld: 99 mg/dL (ref 70–99)
Potassium: 4.6 meq/L (ref 3.5–5.1)
Sodium: 139 meq/L (ref 135–145)
Total Bilirubin: 0.4 mg/dL (ref 0.2–1.2)
Total Protein: 7.5 g/dL (ref 6.0–8.3)

## 2023-06-29 LAB — LIPID PANEL
Cholesterol: 149 mg/dL (ref 0–200)
HDL: 46.3 mg/dL (ref >39.00)
LDL Cholesterol: 73 mg/dL (ref 0–99)
NonHDL: 102.82
Total CHOL/HDL Ratio: 3
Triglycerides: 151 mg/dL — ABNORMAL HIGH (ref 0.0–149.0)
VLDL: 30.2 mg/dL (ref 0.0–40.0)

## 2023-06-29 LAB — HEMOGLOBIN A1C: Hgb A1c MFr Bld: 5.2 % (ref 4.6–6.5)

## 2023-06-29 LAB — TSH: TSH: 1.99 u[IU]/mL (ref 0.35–5.50)

## 2023-06-29 NOTE — Assessment & Plan Note (Signed)
 Repeat CMP pending.   Elevated reading on 05/31/23.

## 2023-06-29 NOTE — Assessment & Plan Note (Addendum)
 EMR reviewed briefly.

## 2023-06-29 NOTE — Assessment & Plan Note (Addendum)
 Discussed with patient to keep a BP log at home and follow up in two weeks.   BP log given. Hand out given for DASH diet.

## 2023-06-29 NOTE — Patient Instructions (Addendum)
 Stop by the lab prior to leaving today. I will notify you of your results once received.   Schedule pap smear at the GYN office.   It was a pleasure to meet you today! Please don't hesitate to contact me with any questions. Welcome to [facility]!

## 2023-06-29 NOTE — Assessment & Plan Note (Addendum)
 Discussed the importance of healthy diet and exercise to affect sustainable weight loss.   Labs pending to rule out  metabolic cause.

## 2023-06-29 NOTE — Assessment & Plan Note (Signed)
 Immunizations tdap- given today.  Pap smear due  Discussed the importance of a healthy diet and regular exercise in order for weight loss, and to reduce the risk of further co-morbidity.  Exam stable. Labs pending.  Follow up in 1 year for repeat physical.

## 2023-06-30 LAB — HEPATITIS C ANTIBODY: Hepatitis C Ab: NONREACTIVE

## 2023-07-02 ENCOUNTER — Encounter: Payer: Self-pay | Admitting: General Practice

## 2023-07-02 ENCOUNTER — Ambulatory Visit: Admitting: General Practice

## 2023-07-02 VITALS — BP 140/102 | HR 86 | Temp 98.4°F | Ht 66.5 in | Wt 215.0 lb

## 2023-07-02 DIAGNOSIS — I10 Essential (primary) hypertension: Secondary | ICD-10-CM | POA: Diagnosis not present

## 2023-07-02 MED ORDER — HYDROCHLOROTHIAZIDE 25 MG PO TABS: 25.0000 mg | ORAL_TABLET | Freq: Every day | ORAL | 0 refills | Status: DC

## 2023-07-02 NOTE — Progress Notes (Signed)
 Established Patient Office Visit  Subjective   Patient ID: [name], female    DOB: 10-13-1985  Age: 38 y.o. MRN: [id number]  Chief Complaint  Patient presents with   Hypertension    BP has been up since visit last week. Not currently taking anything.     Hypertension Pertinent negatives include no chest pain, headaches or shortness of breath.    [name] is a 38 year old female with past medical history of migraine, anxiety, obesity presents today to discuss elevated blood pressure readings.   She sent a mychart message this morning regarding her home blood pressure readings at home which were 121/83 yesterday morning, 153/100 last night and this morning 153/100. She was evaluated at on 06/29/23 for her physical and she had elevated BP readings then as well. She denies any blurred vision, headaches, chest pain, shortness of breat or difficulty breathing. She has not been on any treatment for hypertension previously.    Patient Active Problem List   Diagnosis Date Noted   Primary hypertension 07/02/2023   Establishing care with new doctor, encounter for 06/29/2023   Encounter for screening and preventative care 06/29/2023   Class 1 obesity due to excess calories with body mass index (BMI) of 34.0 to 34.9 in adult 06/29/2023   Elevated ALT measurement 06/29/2023   Elevated blood pressure reading in office without diagnosis of hypertension 06/29/2023   Cholelithiasis 05/30/2023   Adult BMI 30+ 12/15/2015   Irregular menses 07/27/2014   Migraine headache without aura 04/18/2011   Anxiety 04/18/2011   Past Medical History:  Diagnosis Date   Abnormal Pap smear    Migraine    Rh negative state in antepartum period    Past Surgical History:  Procedure Laterality Date   CESAREAN SECTION  10/07/2011   Procedure: CESAREAN SECTION;  Surgeon: Willodean Rosenthal, MD;  Location: [facility];  Service: Obstetrics;  Laterality: N/A;   CESAREAN SECTION WITH BILATERAL TUBAL  LIGATION N/A 12/19/2012   Procedure: CESAREAN SECTION WITH BILATERAL TUBAL LIGATION;  Surgeon: Catalina Antigua, MD;  Location: [facility];  Service: Obstetrics;  Laterality: N/A;   CHOLECYSTECTOMY N/A 05/30/2023   Procedure: LAPAROSCOPIC CHOLECYSTECTOMY WITH INTRAOPERATIVE CHOLANGIOGRAM;  Surgeon: Fritzi Mandes, MD;  Location: [facility];  Service: General;  Laterality: N/A;   DILATION AND CURETTAGE OF UTERUS     TONSILLECTOMY     38 yrs old   TUBAL LIGATION  12/19/2012   No Known Allergies       07/02/2023    2:28 PM 06/29/2023    9:04 AM 05/15/2018   10:03 AM  Depression screen PHQ 2/9  Decreased Interest 0 0 0  Down, Depressed, Hopeless 0 0 0  PHQ - 2 Score 0 0 0  Altered sleeping 0 0 1  Tired, decreased energy 0 0 0  Change in appetite 0 0 0  Feeling bad or failure about yourself  0 0 0  Trouble concentrating 0 0 0  Moving slowly or fidgety/restless 0 0 0  Suicidal thoughts 0 0 0  PHQ-9 Score 0 0 1  Difficult doing work/chores Not difficult at all Not difficult at all Not difficult at all       07/02/2023    2:28 PM 06/29/2023    9:04 AM 11/19/2015   10:43 AM 08/25/2015    9:23 AM  GAD 7 : Generalized Anxiety Score  Nervous, Anxious, on Edge 0 0 1 0  Control/stop worrying 0 0 1 0  Worry too  much - different things 0 0 1 0  Trouble relaxing 0 0 1 0  Restless 0 0 0 0  Easily annoyed or irritable 0 0 2 1  Afraid - awful might happen 0 0 0 0  Total GAD 7 Score 0 0 6 1  Anxiety Difficulty Not difficult at all Not difficult at all Not difficult at all Not difficult at all      Review of Systems  Constitutional:  Negative for chills and fever.  Respiratory:  Negative for shortness of breath.   Cardiovascular:  Negative for chest pain and leg swelling.  Gastrointestinal:  Negative for abdominal pain, constipation, diarrhea, heartburn, nausea and vomiting.  Genitourinary:  Negative for dysuria, frequency and urgency.  Neurological:  Negative for dizziness and headaches.   Endo/Heme/Allergies:  Negative for polydipsia.  Psychiatric/Behavioral:  Negative for depression and suicidal ideas. The patient is not nervous/anxious.       Objective:     BP (!) 140/102 (BP Location: Left Arm, Patient Position: Sitting, Cuff Size: Normal)   Pulse 86   Temp 98.4 F (36.9 C) (Oral)   Ht 5' 6.5" (1.689 m)   Wt 215 lb (97.5 kg)   SpO2 97%   BMI 34.18 kg/m  BP Readings from Last 3 Encounters:  07/02/23 (!) 140/102  06/29/23 (!) 140/96  05/31/23 121/82   Wt Readings from Last 3 Encounters:  07/02/23 215 lb (97.5 kg)  06/29/23 217 lb (98.4 kg)  05/30/23 (P) 215 lb (97.5 kg)      Physical Exam Vitals and nursing note reviewed.  Constitutional:      Appearance: Normal appearance.  Cardiovascular:     Rate and Rhythm: Normal rate and regular rhythm.     Pulses: Normal pulses.     Heart sounds: Normal heart sounds.  Pulmonary:     Effort: Pulmonary effort is normal.     Breath sounds: Normal breath sounds.  Musculoskeletal:        General: No swelling.  Neurological:     Mental Status: She is alert and oriented to person, place, and time.  Psychiatric:        Mood and Affect: Mood normal.        Behavior: Behavior normal.        Thought Content: Thought content normal.        Judgment: Judgment normal.      No results found for any visits on 07/02/23.     The ASCVD Risk score (Arnett DK, et al., 2019) failed to calculate for the following reasons:   The 2019 ASCVD risk score is only valid for ages 62 to 60    Assessment & Plan:  Primary hypertension Assessment & Plan: BP elevated at both readings today. Exam stable.   Agreeable to start medication.   Start hydrochlorothiazide 25 mg once daily in the morning. Discussed side effects.  Continue monitoring BP at home.  DASH diet discussed.  F/u in 2 weeks for BP check and BMP.   Orders: -     hydroCHLOROthiazide; Take 1 tablet (25 mg total) by mouth daily.  Dispense: 30 tablet; Refill:  0     Return in about 2 weeks (around 07/16/2023).    Jolanda Nation, NP

## 2023-07-02 NOTE — Patient Instructions (Addendum)
 Start hydrochlorothiazide 25 mg once daily in the morning   Continue to monitor bp at home.   Follow up in two weeks.   -It was a pleasure to see you today!

## 2023-07-02 NOTE — Assessment & Plan Note (Addendum)
 BP elevated at both readings today. Exam stable.   Agreeable to start medication.   Start hydrochlorothiazide 25 mg once daily in the morning. Discussed side effects.  Continue monitoring BP at home.  DASH diet discussed.  F/u in 2 weeks for BP check and BMP.

## 2023-07-12 ENCOUNTER — Ambulatory Visit: Admitting: General Practice

## 2023-07-17 ENCOUNTER — Other Ambulatory Visit: Payer: Self-pay | Admitting: General Practice

## 2023-07-17 ENCOUNTER — Encounter: Payer: Self-pay | Admitting: General Practice

## 2023-07-17 ENCOUNTER — Telehealth: Payer: Self-pay | Admitting: General Practice

## 2023-07-17 ENCOUNTER — Ambulatory Visit: Admitting: General Practice

## 2023-07-17 VITALS — BP 122/80 | HR 76 | Temp 97.8°F | Ht 66.5 in | Wt 213.0 lb

## 2023-07-17 DIAGNOSIS — Z0000 Encounter for general adult medical examination without abnormal findings: Secondary | ICD-10-CM

## 2023-07-17 DIAGNOSIS — E876 Hypokalemia: Secondary | ICD-10-CM

## 2023-07-17 DIAGNOSIS — I10 Essential (primary) hypertension: Secondary | ICD-10-CM

## 2023-07-17 LAB — BASIC METABOLIC PANEL WITH GFR
BUN: 9 mg/dL (ref 6–23)
CO2: 31 meq/L (ref 19–32)
Calcium: 9.8 mg/dL (ref 8.4–10.5)
Chloride: 98 meq/L (ref 96–112)
Creatinine, Ser: 0.72 mg/dL (ref 0.40–1.20)
GFR: 107.36 mL/min (ref >60.00)
Glucose, Bld: 98 mg/dL (ref 70–99)
Potassium: 3.3 meq/L — ABNORMAL LOW (ref 3.5–5.1)
Sodium: 136 meq/L (ref 135–145)

## 2023-07-17 MED ORDER — HYDROCHLOROTHIAZIDE 25 MG PO TABS: 25.0000 mg | ORAL_TABLET | Freq: Every day | ORAL | 1 refills | Status: DC

## 2023-07-17 MED ORDER — POTASSIUM CHLORIDE CRYS ER 10 MEQ PO TBCR: 10.0000 meq | EXTENDED_RELEASE_TABLET | Freq: Every day | ORAL | 0 refills | Status: DC

## 2023-07-17 NOTE — Telephone Encounter (Signed)
-----   Message from Jolanda Nation sent at 07/17/2023 12:41 PM EDT ----- Patient needs a lab only appt in two weeks. Please schedule. -Jolanda Nation, DNP, AGNP-C 07/17/2023 12:42 PM

## 2023-07-17 NOTE — Progress Notes (Signed)
 Established Patient Office Visit  Subjective   Patient ID: [name], female    DOB: Oct 01, 1985  Age: 38 y.o. MRN: [id number]  Chief Complaint  Patient presents with   Hypertension    Taking hydrochlorothiazide  daily; BP doing good.     HPI  [name] is a 38 year old female with past medical history of migraine, HTN, anxiety, obesity presents today for a follow up.   HTN: she is currently managed on hydrochlorothiazide  25 mg once daily. The home BP readings have been in the 120's-130s / 80's range. She reports that she has been voiding more. Denies any dizziness, blurred vision, headaches, chest pain or shortness of breath.    Patient Active Problem List   Diagnosis Date Noted   Primary hypertension 07/02/2023   Establishing care with new doctor, encounter for 06/29/2023   Encounter for screening and preventative care 06/29/2023   Class 1 obesity due to excess calories with body mass index (BMI) of 34.0 to 34.9 in adult 06/29/2023   Elevated ALT measurement 06/29/2023   Elevated blood pressure reading in office without diagnosis of hypertension 06/29/2023   Cholelithiasis 05/30/2023   Adult BMI 30+ 12/15/2015   Irregular menses 07/27/2014   Migraine headache without aura 04/18/2011   Anxiety 04/18/2011   Past Medical History:  Diagnosis Date   Abnormal Pap smear    Migraine    Rh negative state in antepartum period    Past Surgical History:  Procedure Laterality Date   CESAREAN SECTION  10/07/2011   Procedure: CESAREAN SECTION;  Surgeon: Lenord Radon, MD;  Location: [facility];  Service: Obstetrics;  Laterality: N/A;   CESAREAN SECTION WITH BILATERAL TUBAL LIGATION N/A 12/19/2012   Procedure: CESAREAN SECTION WITH BILATERAL TUBAL LIGATION;  Surgeon: Verlyn Goad, MD;  Location: [facility];  Service: Obstetrics;  Laterality: N/A;   CHOLECYSTECTOMY N/A 05/30/2023   Procedure: LAPAROSCOPIC CHOLECYSTECTOMY WITH INTRAOPERATIVE CHOLANGIOGRAM;  Surgeon: Lujean Sake, MD;  Location: [facility];  Service: General;  Laterality: N/A;   DILATION AND CURETTAGE OF UTERUS     TONSILLECTOMY     38 yrs old   TUBAL LIGATION  12/19/2012   No Known Allergies       07/17/2023    9:00 AM 07/02/2023    2:28 PM 06/29/2023    9:04 AM  Depression screen PHQ 2/9  Decreased Interest 0 0 0  Down, Depressed, Hopeless 0 0 0  PHQ - 2 Score 0 0 0  Altered sleeping 0 0 0  Tired, decreased energy 0 0 0  Change in appetite 0 0 0  Feeling bad or failure about yourself  0 0 0  Trouble concentrating 0 0 0  Moving slowly or fidgety/restless 0 0 0  Suicidal thoughts 0 0 0  PHQ-9 Score 0 0 0  Difficult doing work/chores Not difficult at all Not difficult at all Not difficult at all       07/17/2023    9:00 AM 07/02/2023    2:28 PM 06/29/2023    9:04 AM 11/19/2015   10:43 AM  GAD 7 : Generalized Anxiety Score  Nervous, Anxious, on Edge 0 0 0 1  Control/stop worrying 0 0 0 1  Worry too much - different things 0 0 0 1  Trouble relaxing 0 0 0 1  Restless 0 0 0 0  Easily annoyed or irritable 0 0 0 2  Afraid - awful might happen 0 0 0 0  Total GAD 7 Score  0 0 0 6  Anxiety Difficulty Not difficult at all Not difficult at all Not difficult at all Not difficult at all      Review of Systems  Constitutional:  Negative for chills and fever.  Respiratory:  Negative for shortness of breath.   Cardiovascular:  Negative for chest pain.  Gastrointestinal:  Negative for abdominal pain, constipation, diarrhea, heartburn, nausea and vomiting.  Genitourinary:  Negative for dysuria, frequency and urgency.  Neurological:  Negative for dizziness and headaches.  Endo/Heme/Allergies:  Negative for polydipsia.  Psychiatric/Behavioral:  Negative for depression and suicidal ideas. The patient is not nervous/anxious.       Objective:     BP 122/80 (BP Location: Left Arm, Patient Position: Sitting, Cuff Size: Normal)   Pulse 76   Temp 97.8 F (36.6 C) (Oral)   Ht 5' 6.5" (1.689 m)    Wt 213 lb (96.6 kg)   SpO2 96%   BMI 33.86 kg/m  BP Readings from Last 3 Encounters:  07/17/23 122/80  07/02/23 (!) 140/102  06/29/23 (!) 140/96   Wt Readings from Last 3 Encounters:  07/17/23 213 lb (96.6 kg)  07/02/23 215 lb (97.5 kg)  06/29/23 217 lb (98.4 kg)      Physical Exam Vitals and nursing note reviewed.  Constitutional:      Appearance: Normal appearance.  Cardiovascular:     Rate and Rhythm: Normal rate and regular rhythm.     Pulses: Normal pulses.     Heart sounds: Normal heart sounds.  Pulmonary:     Effort: Pulmonary effort is normal.     Breath sounds: Normal breath sounds.  Neurological:     Mental Status: She is alert and oriented to person, place, and time.  Psychiatric:        Mood and Affect: Mood normal.        Behavior: Behavior normal.        Thought Content: Thought content normal.        Judgment: Judgment normal.      No results found for any visits on 07/17/23.     The ASCVD Risk score (Arnett DK, et al., 2019) failed to calculate for the following reasons:   The 2019 ASCVD risk score is only valid for ages 44 to 60    Assessment & Plan:  Primary hypertension Assessment & Plan: BP at goal. Exam stable.  Continue hydrochlorothiazide  25 mg once daily. Refills provided.  Bmp pending.  DASH diet discussed.  Discussed side effects of hypotension.   F/u in 6 months.   Orders: -     hydroCHLOROthiazide ; Take 1 tablet (25 mg total) by mouth daily.  Dispense: 90 tablet; Refill: 1 -     Basic metabolic panel with GFR     Return in about 6 months (around 01/16/2024) for hypertension.    Jolanda Nation, NP

## 2023-07-17 NOTE — Patient Instructions (Signed)
 Stop by the lab prior to leaving today. I will notify you of your results once received.   Continue hydrochlorothiazide  25 mg once daily.   Keep me posted if your BP is consistently lower than 90/60.   Follow up in 6 months.   It was a pleasure to see you today!

## 2023-07-17 NOTE — Telephone Encounter (Signed)
 Left vm to schedule Lab only appt in two weeks

## 2023-07-17 NOTE — Assessment & Plan Note (Signed)
 BP at goal. Exam stable.  Continue hydrochlorothiazide  25 mg once daily. Refills provided.  Bmp pending.  DASH diet discussed.  Discussed side effects of hypotension.   F/u in 6 months.

## 2023-08-01 ENCOUNTER — Other Ambulatory Visit (INDEPENDENT_AMBULATORY_CARE_PROVIDER_SITE_OTHER)

## 2023-08-01 ENCOUNTER — Ambulatory Visit: Payer: Self-pay | Admitting: General Practice

## 2023-08-01 DIAGNOSIS — E876 Hypokalemia: Secondary | ICD-10-CM | POA: Diagnosis not present

## 2023-08-01 LAB — POTASSIUM: Potassium: 3.8 meq/L (ref 3.5–5.1)

## 2023-08-23 ENCOUNTER — Other Ambulatory Visit (HOSPITAL_COMMUNITY)
Admission: RE | Admit: 2023-08-23 | Discharge: 2023-08-23 | Disposition: A | Discharge status: Home / Self Care | Source: Ambulatory Visit | Attending: Family Medicine | Admitting: Family Medicine

## 2023-08-23 ENCOUNTER — Ambulatory Visit (INDEPENDENT_AMBULATORY_CARE_PROVIDER_SITE_OTHER): Admitting: Family Medicine

## 2023-08-23 ENCOUNTER — Encounter: Payer: Self-pay | Admitting: Family Medicine

## 2023-08-23 VITALS — BP 147/87 | HR 66 | Ht 67.0 in | Wt 219.0 lb

## 2023-08-23 DIAGNOSIS — Z124 Encounter for screening for malignant neoplasm of cervix: Secondary | ICD-10-CM | POA: Insufficient documentation

## 2023-08-23 DIAGNOSIS — Z01419 Encounter for gynecological examination (general) (routine) without abnormal findings: Secondary | ICD-10-CM

## 2023-08-23 DIAGNOSIS — Z1331 Encounter for screening for depression: Secondary | ICD-10-CM

## 2023-08-23 NOTE — Progress Notes (Addendum)
 Subjective:     [name] is a 38 y.o. female and is here for a comprehensive physical exam. The patient reports no problems. IUD removed last time. Cycles are regular and are light.  The following portions of the patient's history were reviewed and updated as appropriate: allergies, current medications, past family history, past medical history, past social history, past surgical history, and problem list.  Review of Systems Pertinent items noted in HPI and remainder of comprehensive ROS otherwise negative.   Objective:  Chaperone present for exam   BP (!) 147/87   Pulse 66   Ht 5\' 7"  (1.702 m)   Wt 219 lb (99.3 kg)   LMP 08/13/2023 (Exact Date)   BMI 34.30 kg/m  General appearance: alert, cooperative, and appears stated age Head: Normocephalic, without obvious abnormality, atraumatic Neck: no adenopathy, supple, symmetrical, trachea midline, and thyroid  not enlarged, symmetric, no tenderness/mass/nodules Lungs: clear to auscultation bilaterally Breasts: normal appearance, no masses or tenderness Heart: regular rate and rhythm, S1, S2 normal, no murmur, click, rub or gallop Abdomen: soft, non-tender; bowel sounds normal; no masses,  no organomegaly Pelvic: cervix normal in appearance, external genitalia normal, no adnexal masses or tenderness, no cervical motion tenderness, uterus normal size, shape, and consistency, and vagina normal without discharge Extremities: extremities normal, atraumatic, no cyanosis or edema Pulses: 2+ and symmetric Skin: Skin color, texture, turgor normal. No rashes or lesions Lymph nodes: Cervical, supraclavicular, and axillary nodes normal. Neurologic: Grossly normal    Assessment:    Healthy female exam.      Plan:   Problem List Items Addressed This Visit   None Visit Diagnoses       Encounter for gynecological examination without abnormal finding    -  Primary   Has PCP who manages her medical problems and does her labs.      Screening for malignant neoplasm of cervix       Relevant Orders   Cytology - PAP      Return in 1 year (on 08/22/2024).    See After Visit Summary for Counseling Recommendations

## 2023-08-23 NOTE — Patient Instructions (Signed)

## 2023-08-23 NOTE — Progress Notes (Signed)
 Patient presents for Annual. *Pt on Blood Pressure Rx.  LMP: 08/13/2023 Last pap: 06/03/2018  Contraception: BTL  Mammogram: Not yet indicated STD Screening: Declines   CC: Annual/None

## 2023-08-28 ENCOUNTER — Ambulatory Visit: Payer: Self-pay | Admitting: Family Medicine

## 2023-08-28 LAB — CYTOLOGY - PAP
Adequacy: ABSENT
Comment: NEGATIVE
Diagnosis: UNDETERMINED — AB
High risk HPV: NEGATIVE

## 2024-01-16 ENCOUNTER — Ambulatory Visit: Admitting: General Practice

## 2024-01-16 ENCOUNTER — Encounter: Payer: Self-pay | Admitting: General Practice

## 2024-01-16 ENCOUNTER — Ambulatory Visit: Payer: Self-pay | Admitting: General Practice

## 2024-01-16 VITALS — BP 120/80 | HR 76 | Temp 98.8°F | Ht 67.0 in | Wt 216.8 lb

## 2024-01-16 DIAGNOSIS — I10 Essential (primary) hypertension: Secondary | ICD-10-CM | POA: Diagnosis not present

## 2024-01-16 DIAGNOSIS — E559 Vitamin D deficiency, unspecified: Secondary | ICD-10-CM

## 2024-01-16 DIAGNOSIS — H01134 Eczematous dermatitis of left upper eyelid: Secondary | ICD-10-CM | POA: Insufficient documentation

## 2024-01-16 DIAGNOSIS — E66811 Obesity, class 1: Secondary | ICD-10-CM

## 2024-01-16 DIAGNOSIS — R232 Flushing: Secondary | ICD-10-CM | POA: Diagnosis not present

## 2024-01-16 DIAGNOSIS — R5383 Other fatigue: Secondary | ICD-10-CM | POA: Diagnosis not present

## 2024-01-16 DIAGNOSIS — Z6833 Body mass index (BMI) 33.0-33.9, adult: Secondary | ICD-10-CM

## 2024-01-16 DIAGNOSIS — E6609 Other obesity due to excess calories: Secondary | ICD-10-CM

## 2024-01-16 DIAGNOSIS — N926 Irregular menstruation, unspecified: Secondary | ICD-10-CM

## 2024-01-16 LAB — CBC
HCT: 39 % (ref 36.0–46.0)
Hemoglobin: 13.5 g/dL (ref 12.0–15.0)
MCHC: 34.6 g/dL (ref 30.0–36.0)
MCV: 89.1 fl (ref 78.0–100.0)
Platelets: 301 K/uL (ref 150.0–400.0)
RBC: 4.38 Mil/uL (ref 3.87–5.11)
RDW: 13.9 % (ref 11.5–15.5)
WBC: 7.5 K/uL (ref 4.0–10.5)

## 2024-01-16 LAB — BASIC METABOLIC PANEL WITH GFR
BUN: 9 mg/dL (ref 6–23)
CO2: 31 meq/L (ref 19–32)
Calcium: 9.7 mg/dL (ref 8.4–10.5)
Chloride: 99 meq/L (ref 96–112)
Creatinine, Ser: 0.79 mg/dL (ref 0.40–1.20)
GFR: 95.71 mL/min (ref >60.00)
Glucose, Bld: 108 mg/dL — ABNORMAL HIGH (ref 70–99)
Potassium: 3.3 meq/L — ABNORMAL LOW (ref 3.5–5.1)
Sodium: 138 meq/L (ref 135–145)

## 2024-01-16 LAB — VITAMIN D 25 HYDROXY (VIT D DEFICIENCY, FRACTURES): VITD: 29.45 ng/mL — ABNORMAL LOW (ref 30.00–100.00)

## 2024-01-16 LAB — FOLLICLE STIMULATING HORMONE: FSH: 5.5 m[IU]/mL

## 2024-01-16 LAB — VITAMIN B12: Vitamin B-12: 316 pg/mL (ref 211–911)

## 2024-01-16 LAB — TSH: TSH: 2.61 u[IU]/mL (ref 0.35–5.50)

## 2024-01-16 LAB — LUTEINIZING HORMONE: LH: 9.51 m[IU]/mL

## 2024-01-16 MED ORDER — VITAMIN D (ERGOCALCIFEROL) 1.25 MG (50000 UNIT) PO CAPS: 50000.0000 [IU] | ORAL_CAPSULE | ORAL | 0 refills | Status: AC

## 2024-01-16 MED ORDER — HYDROCHLOROTHIAZIDE 25 MG PO TABS: 25.0000 mg | ORAL_TABLET | Freq: Every day | ORAL | 1 refills | Status: AC

## 2024-01-16 NOTE — Patient Instructions (Addendum)
 Stop by the lab prior to leaving today. I will notify you of your results once received.   You will either be contacted via phone regarding your referral to dermatology , or you may receive a letter on your MyChart portal from our referral team with instructions for scheduling an appointment. Please let us  know if you have not been contacted by anyone within two weeks.   Schedule appt with GYN regarding perimenopausal.   Follow up in 6 months for HTN and physical and fasting labs.  It was a pleasure to see you today!

## 2024-01-16 NOTE — Assessment & Plan Note (Signed)
 Following with gyn.   Will check FSH and LH pending. She will follow up with them as needed.

## 2024-01-16 NOTE — Assessment & Plan Note (Signed)
 Unclear etiology  Will check labs to rule out metabolic cause.

## 2024-01-16 NOTE — Assessment & Plan Note (Signed)
 Discussed the importance of healthy diet and exercise to affect sustainable weight loss.

## 2024-01-16 NOTE — Progress Notes (Signed)
 Established Patient Office Visit  Subjective   Patient ID: [name], female    DOB: [birth year]  Age: 38 y.o. MRN: [id number]  Chief Complaint  Patient presents with   Hypertension    Patient here today for BP follow up; currently taking hydrochlorothiazide  25 mg. Patient hasn't been taking her BP much but when she does its been normal. Patient states a lot of afternoons she feels very drained; no headaches or dizziness.     Hypertension Associated symptoms include malaise/fatigue. Pertinent negatives include no blurred vision, chest pain, headaches or shortness of breath.    [name] is  38 year old female with past medical history of HTN, migraine, obesity and anxiety presents today for HTN follow up.   Discussed the use of AI scribe software for clinical note transcription with the patient, who gave verbal consent to proceed.  History of Present Illness [name] is a 38 year old female with hypertension who presents for a blood pressure follow-up and evaluation of fatigue.  She is on hydrochlorothiazide  25 mg once daily for hypertension and takes it consistently. Her home blood pressure readings are typically around 117/79 mmHg. No blurred vision, chest pain, headaches, shortness of breath, difficulty breathing, fever, chills, abdominal symptoms, or urinary symptoms.  She experiences fatigue primarily in the evenings, which started about a month ago. The fatigue is sporadic and not constant. She has difficulty falling asleep but does not experience racing thoughts or anxiety. She takes a multivitamin but questions its sufficiency. She denies heavy periods.   She suspects perimenopause due to erratic menstrual cycles and waking up drenched in sweat. She had two periods last month and describes her menstrual cycle as irregular, sometimes starting a week early or late. She is not on birth control as she has had a tubal ligation.  She reports dry patches on her  eyelids and facial dryness. She has not had significant sun exposure recently and has been using [place] Roche face wash, vitamin E oil, and moisturizer for her dry eyelids without much improvement.    Patient Active Problem List   Diagnosis Date Noted   Fatigue 01/16/2024   Hot flashes 01/16/2024   Eczema of left upper eyelid 01/16/2024   Primary hypertension 07/02/2023   Encounter for screening and preventative care 06/29/2023   Obesity 06/29/2023   Elevated ALT measurement 06/29/2023   Migraine headache without aura 04/18/2011   Anxiety 04/18/2011   Past Medical History:  Diagnosis Date   Abnormal Pap smear    Hypertension    Migraine    Rh negative state in antepartum period    Past Surgical History:  Procedure Laterality Date   CESAREAN SECTION  10/07/2011   Procedure: CESAREAN SECTION;  Surgeon: Elveria Mungo, MD;  Location: [facility];  Service: Obstetrics;  Laterality: N/A;   CESAREAN SECTION WITH BILATERAL TUBAL LIGATION N/A 12/19/2012   Procedure: CESAREAN SECTION WITH BILATERAL TUBAL LIGATION;  Surgeon: Winton Felt, MD;  Location: [facility];  Service: Obstetrics;  Laterality: N/A;   CHOLECYSTECTOMY N/A 05/30/2023   Procedure: LAPAROSCOPIC CHOLECYSTECTOMY WITH INTRAOPERATIVE CHOLANGIOGRAM;  Surgeon: Dasie Leonor CROME, MD;  Location: [facility];  Service: General;  Laterality: N/A;   DILATION AND CURETTAGE OF UTERUS     TONSILLECTOMY     38 yrs old   TUBAL LIGATION  12/19/2012   No Known Allergies       01/16/2024    8:11 AM 08/23/2023    1:16 PM 07/17/2023  9:00 AM  Depression screen PHQ 2/9  Decreased Interest 0 0 0  Down, Depressed, Hopeless 0 0 0  PHQ - 2 Score 0 0 0  Altered sleeping 1 0 0  Tired, decreased energy 1 0 0  Change in appetite 0 0 0  Feeling bad or failure about yourself  0 0 0  Trouble concentrating 0 0 0  Moving slowly or fidgety/restless 0 0 0  Suicidal thoughts 0 0 0  PHQ-9 Score 2 0 0  Difficult doing work/chores Not difficult at all Not  difficult at all Not difficult at all       01/16/2024    8:12 AM 08/23/2023    1:16 PM 07/17/2023    9:00 AM 07/02/2023    2:28 PM  GAD 7 : Generalized Anxiety Score  Nervous, Anxious, on Edge 0 0 0 0  Control/stop worrying 0 0 0 0  Worry too much - different things 0 0 0 0  Trouble relaxing 0 0 0 0  Restless 0 0 0 0  Easily annoyed or irritable 0 0 0 0  Afraid - awful might happen 0 0 0 0  Total GAD 7 Score 0 0 0 0  Anxiety Difficulty Not difficult at all Not difficult at all Not difficult at all Not difficult at all      Review of Systems  Constitutional:  Positive for malaise/fatigue. Negative for chills and fever.  Eyes:  Negative for blurred vision.  Respiratory:  Negative for shortness of breath.   Cardiovascular:  Negative for chest pain.  Gastrointestinal:  Negative for abdominal pain, constipation, diarrhea, heartburn, nausea and vomiting.  Genitourinary:  Negative for dysuria, frequency and urgency.  Skin:        Dry patch on both upper eye lids  Neurological:  Negative for dizziness and headaches.  Endo/Heme/Allergies:  Negative for polydipsia.  Psychiatric/Behavioral:  Negative for depression and suicidal ideas. The patient is not nervous/anxious.       Objective:     BP 120/80   Pulse 76   Temp 98.8 F (37.1 C) (Temporal)   Ht 5' 7 (1.702 m)   Wt 216 lb 12.8 oz (98.3 kg)   SpO2 96%   BMI 33.96 kg/m  BP Readings from Last 3 Encounters:  01/16/24 120/80  08/23/23 (!) 147/87  07/17/23 122/80   Wt Readings from Last 3 Encounters:  01/16/24 216 lb 12.8 oz (98.3 kg)  08/23/23 219 lb (99.3 kg)  07/17/23 213 lb (96.6 kg)      Physical Exam Vitals and nursing note reviewed.  Constitutional:      Appearance: Normal appearance.  Eyes:      Comments: Eczema dermatitis present on exam.  Cardiovascular:     Rate and Rhythm: Normal rate and regular rhythm.     Pulses: Normal pulses.     Heart sounds: Normal heart sounds.  Pulmonary:     Effort:  Pulmonary effort is normal.     Breath sounds: Normal breath sounds.  Neurological:     Mental Status: She is alert and oriented to person, place, and time.  Psychiatric:        Mood and Affect: Mood normal.        Behavior: Behavior normal.        Thought Content: Thought content normal.        Judgment: Judgment normal.      No results found for any visits on 01/16/24.     The ASCVD Risk score (  Arnett DK, et al., 2019) failed to calculate for the following reasons:   The 2019 ASCVD risk score is only valid for ages 67 to 41    Assessment & Plan:  Primary hypertension -     hydroCHLOROthiazide ; Take 1 tablet (25 mg total) by mouth daily.  Dispense: 90 tablet; Refill: 1 -     Basic metabolic panel with GFR  Fatigue, unspecified type Assessment & Plan: Unclear etiology  Will check labs to rule out metabolic cause.   Orders: -     TSH -     CBC -     VITAMIN D  25 Hydroxy (Vit-D Deficiency, Fractures) -     Vitamin B12  Hot flashes Assessment & Plan: Following with gyn.   Will check FSH and LH pending. She will follow up with them as needed.  Orders: -     Follicle stimulating hormone -     Luteinizing hormone  Eczema of left upper eyelid -     Ambulatory referral to Dermatology  Class 1 obesity due to excess calories with serious comorbidity and body mass index (BMI) of 33.0 to 33.9 in adult Assessment & Plan: Discussed the importance of healthy diet and exercise to affect sustainable weight loss.     Irregular periods    Assessment and Plan Assessment & Plan Essential hypertension Blood pressure well-controlled with current medication. Occasional fatigue noted, unrelated to medication.  - Continue Hydrochlorothiazide  25 mg oral once daily. - Check kidney function due to hydrochlorothiazide  use. - Follow up in 6 months for blood pressure management unless symptoms worsen.  Eczematous dermatitis of eyelids Persistent dry patches on eyelids despite  current skincare regimen.  - Refer to dermatology for further evaluation. - Recommend using Aquaphor or Eucerin eczema relief for moisturizing.   Return in about 6 months (around 07/16/2024) for physical and fasting labs and HTN.SABRA Carrol Aurora, NP

## 2024-07-03 ENCOUNTER — Encounter: Admitting: General Practice
